# Patient Record
Sex: Male | Born: 1950 | Race: Black or African American | Hispanic: No | Marital: Married | Smoking: Former smoker
Health system: Southern US, Community
[De-identification: ages and names within clinical notes are randomized; demographics above are authoritative.]

## PROBLEM LIST (undated history)

## (undated) DIAGNOSIS — E119 Type 2 diabetes mellitus without complications: Secondary | ICD-10-CM

## (undated) DIAGNOSIS — K859 Acute pancreatitis without necrosis or infection, unspecified: Secondary | ICD-10-CM

## (undated) DIAGNOSIS — I10 Essential (primary) hypertension: Secondary | ICD-10-CM

## (undated) DIAGNOSIS — Z8619 Personal history of other infectious and parasitic diseases: Secondary | ICD-10-CM

## (undated) DIAGNOSIS — T8859XA Other complications of anesthesia, initial encounter: Secondary | ICD-10-CM

## (undated) DIAGNOSIS — F4310 Post-traumatic stress disorder, unspecified: Secondary | ICD-10-CM

## (undated) DIAGNOSIS — G629 Polyneuropathy, unspecified: Secondary | ICD-10-CM

## (undated) DIAGNOSIS — R42 Dizziness and giddiness: Secondary | ICD-10-CM

## (undated) DIAGNOSIS — Z87442 Personal history of urinary calculi: Secondary | ICD-10-CM

## (undated) DIAGNOSIS — E7800 Pure hypercholesterolemia, unspecified: Secondary | ICD-10-CM

## (undated) DIAGNOSIS — M1990 Unspecified osteoarthritis, unspecified site: Secondary | ICD-10-CM

## (undated) DIAGNOSIS — G4730 Sleep apnea, unspecified: Secondary | ICD-10-CM

## (undated) HISTORY — PX: BACK SURGERY: SHX140

## (undated) HISTORY — PX: HERNIA REPAIR: SHX51

## (undated) HISTORY — PX: COLONOSCOPY: SHX174

## (undated) HISTORY — PX: CIRCUMCISION: SUR203

## (undated) HISTORY — PX: HYDROCELE EXCISION / REPAIR: SUR1145

---

## 2004-07-17 ENCOUNTER — Emergency Department: Payer: Self-pay | Admitting: General Practice

## 2004-09-21 ENCOUNTER — Ambulatory Visit: Payer: Self-pay | Admitting: Unknown Physician Specialty

## 2004-11-09 ENCOUNTER — Ambulatory Visit: Payer: Self-pay | Admitting: Unknown Physician Specialty

## 2005-01-16 ENCOUNTER — Ambulatory Visit: Payer: Self-pay | Admitting: Pain Medicine

## 2005-01-19 ENCOUNTER — Ambulatory Visit: Payer: Self-pay | Admitting: Pain Medicine

## 2005-01-23 ENCOUNTER — Ambulatory Visit: Payer: Self-pay | Admitting: Pain Medicine

## 2005-02-02 ENCOUNTER — Ambulatory Visit: Payer: Self-pay | Admitting: Physician Assistant

## 2005-02-09 ENCOUNTER — Ambulatory Visit: Payer: Self-pay | Admitting: Pain Medicine

## 2005-02-23 ENCOUNTER — Ambulatory Visit: Payer: Self-pay | Admitting: Pain Medicine

## 2005-03-13 ENCOUNTER — Ambulatory Visit: Payer: Self-pay | Admitting: Physician Assistant

## 2005-03-17 ENCOUNTER — Ambulatory Visit: Payer: Self-pay | Admitting: Neurosurgery

## 2005-03-21 ENCOUNTER — Ambulatory Visit: Payer: Self-pay | Admitting: Neurosurgery

## 2005-05-17 ENCOUNTER — Ambulatory Visit (HOSPITAL_COMMUNITY): Admission: RE | Admit: 2005-05-17 | Discharge: 2005-05-18 | Payer: Self-pay | Admitting: Neurosurgery

## 2005-06-06 ENCOUNTER — Ambulatory Visit: Payer: Self-pay | Admitting: Neurosurgery

## 2005-06-29 ENCOUNTER — Ambulatory Visit: Payer: Self-pay | Admitting: Neurosurgery

## 2005-07-03 ENCOUNTER — Ambulatory Visit (HOSPITAL_COMMUNITY): Admission: RE | Admit: 2005-07-03 | Discharge: 2005-07-04 | Payer: Self-pay | Admitting: Neurosurgery

## 2005-07-28 ENCOUNTER — Ambulatory Visit: Payer: Self-pay | Admitting: Gastroenterology

## 2005-07-28 ENCOUNTER — Emergency Department: Payer: Self-pay | Admitting: Emergency Medicine

## 2005-08-14 ENCOUNTER — Ambulatory Visit: Payer: Self-pay | Admitting: Unknown Physician Specialty

## 2005-08-16 ENCOUNTER — Ambulatory Visit: Payer: Self-pay | Admitting: Internal Medicine

## 2005-08-17 ENCOUNTER — Ambulatory Visit: Payer: Self-pay | Admitting: Gastroenterology

## 2005-10-10 ENCOUNTER — Encounter: Admission: RE | Admit: 2005-10-10 | Discharge: 2005-10-10 | Payer: Self-pay | Admitting: Neurosurgery

## 2005-10-25 ENCOUNTER — Encounter: Admission: RE | Admit: 2005-10-25 | Discharge: 2005-10-25 | Payer: Self-pay | Admitting: Neurosurgery

## 2005-12-28 ENCOUNTER — Encounter: Admission: RE | Admit: 2005-12-28 | Discharge: 2005-12-28 | Payer: Self-pay | Admitting: Neurosurgery

## 2006-01-24 ENCOUNTER — Ambulatory Visit: Payer: Self-pay | Admitting: Pain Medicine

## 2006-02-05 ENCOUNTER — Ambulatory Visit: Payer: Self-pay | Admitting: Pain Medicine

## 2006-02-16 ENCOUNTER — Ambulatory Visit: Payer: Self-pay | Admitting: Physician Assistant

## 2006-03-20 ENCOUNTER — Ambulatory Visit: Payer: Self-pay | Admitting: Physician Assistant

## 2006-04-19 ENCOUNTER — Ambulatory Visit: Payer: Self-pay | Admitting: Physician Assistant

## 2006-05-16 ENCOUNTER — Ambulatory Visit: Payer: Self-pay | Admitting: Pain Medicine

## 2006-06-18 ENCOUNTER — Ambulatory Visit: Payer: Self-pay | Admitting: Physician Assistant

## 2006-07-17 ENCOUNTER — Ambulatory Visit: Payer: Self-pay | Admitting: Physician Assistant

## 2006-08-15 ENCOUNTER — Ambulatory Visit: Payer: Self-pay | Admitting: Physician Assistant

## 2006-09-17 ENCOUNTER — Ambulatory Visit: Payer: Self-pay | Admitting: Physician Assistant

## 2006-10-16 ENCOUNTER — Ambulatory Visit: Payer: Self-pay | Admitting: Physician Assistant

## 2006-11-20 ENCOUNTER — Ambulatory Visit: Payer: Self-pay | Admitting: Physician Assistant

## 2006-11-27 ENCOUNTER — Ambulatory Visit: Payer: Self-pay | Admitting: Pain Medicine

## 2006-12-19 ENCOUNTER — Ambulatory Visit: Payer: Self-pay | Admitting: Physician Assistant

## 2007-01-15 ENCOUNTER — Ambulatory Visit: Payer: Self-pay | Admitting: Physician Assistant

## 2007-02-19 ENCOUNTER — Ambulatory Visit: Payer: Self-pay | Admitting: Physician Assistant

## 2007-03-19 ENCOUNTER — Ambulatory Visit: Payer: Self-pay | Admitting: Physician Assistant

## 2007-04-25 ENCOUNTER — Ambulatory Visit: Payer: Self-pay | Admitting: Pain Medicine

## 2007-05-20 ENCOUNTER — Ambulatory Visit: Payer: Self-pay | Admitting: Pain Medicine

## 2007-06-04 ENCOUNTER — Ambulatory Visit: Payer: Self-pay | Admitting: Pain Medicine

## 2007-07-16 ENCOUNTER — Ambulatory Visit: Payer: Self-pay | Admitting: Physician Assistant

## 2007-08-13 ENCOUNTER — Ambulatory Visit: Payer: Self-pay | Admitting: Physician Assistant

## 2007-09-10 ENCOUNTER — Ambulatory Visit: Payer: Self-pay | Admitting: Physician Assistant

## 2007-09-17 ENCOUNTER — Ambulatory Visit: Payer: Self-pay | Admitting: Pain Medicine

## 2007-10-02 ENCOUNTER — Ambulatory Visit: Payer: Self-pay | Admitting: Pain Medicine

## 2007-10-07 ENCOUNTER — Ambulatory Visit: Payer: Self-pay | Admitting: Pain Medicine

## 2007-10-14 ENCOUNTER — Ambulatory Visit: Payer: Self-pay | Admitting: Pain Medicine

## 2007-11-04 ENCOUNTER — Ambulatory Visit: Payer: Self-pay | Admitting: Urology

## 2007-12-10 ENCOUNTER — Ambulatory Visit: Payer: Self-pay | Admitting: Physician Assistant

## 2008-02-17 ENCOUNTER — Ambulatory Visit: Payer: Self-pay | Admitting: Physician Assistant

## 2008-04-14 ENCOUNTER — Ambulatory Visit: Payer: Self-pay | Admitting: Physician Assistant

## 2009-03-31 ENCOUNTER — Ambulatory Visit: Payer: Self-pay | Admitting: Family Medicine

## 2009-12-20 ENCOUNTER — Ambulatory Visit: Payer: Self-pay | Admitting: Gastroenterology

## 2010-06-17 ENCOUNTER — Ambulatory Visit: Payer: Self-pay | Admitting: Otolaryngology

## 2012-06-19 DIAGNOSIS — N138 Other obstructive and reflux uropathy: Secondary | ICD-10-CM | POA: Insufficient documentation

## 2012-06-19 DIAGNOSIS — N529 Male erectile dysfunction, unspecified: Secondary | ICD-10-CM | POA: Insufficient documentation

## 2012-06-19 DIAGNOSIS — E291 Testicular hypofunction: Secondary | ICD-10-CM | POA: Insufficient documentation

## 2012-06-19 DIAGNOSIS — N411 Chronic prostatitis: Secondary | ICD-10-CM | POA: Insufficient documentation

## 2012-10-30 DIAGNOSIS — M1710 Unilateral primary osteoarthritis, unspecified knee: Secondary | ICD-10-CM | POA: Insufficient documentation

## 2012-10-30 DIAGNOSIS — M48061 Spinal stenosis, lumbar region without neurogenic claudication: Secondary | ICD-10-CM | POA: Insufficient documentation

## 2013-04-01 ENCOUNTER — Emergency Department: Payer: Self-pay | Admitting: Emergency Medicine

## 2013-04-01 LAB — CBC WITH DIFFERENTIAL/PLATELET
Basophil #: 0 10*3/uL (ref 0.0–0.1)
Basophil %: 0.4 %
Eosinophil #: 0.1 10*3/uL (ref 0.0–0.7)
HCT: 46.5 % (ref 40.0–52.0)
HGB: 15.4 g/dL (ref 13.0–18.0)
Lymphocyte #: 1.5 10*3/uL (ref 1.0–3.6)
MCH: 30.5 pg (ref 26.0–34.0)
MCV: 92 fL (ref 80–100)
Monocyte %: 5.9 %
Neutrophil %: 69.9 %
Platelet: 125 10*3/uL — ABNORMAL LOW (ref 150–440)
RDW: 14.1 % (ref 11.5–14.5)
WBC: 6.6 10*3/uL (ref 3.8–10.6)

## 2013-04-01 LAB — COMPREHENSIVE METABOLIC PANEL
Albumin: 3.9 g/dL (ref 3.4–5.0)
Alkaline Phosphatase: 66 U/L
Anion Gap: 3 — ABNORMAL LOW (ref 7–16)
Bilirubin,Total: 0.7 mg/dL (ref 0.2–1.0)
Calcium, Total: 9.3 mg/dL (ref 8.5–10.1)
Co2: 31 mmol/L (ref 21–32)
EGFR (Non-African Amer.): >60
Glucose: 246 mg/dL — ABNORMAL HIGH (ref 65–99)
SGOT(AST): 23 U/L (ref 15–37)

## 2013-04-01 LAB — URINALYSIS, COMPLETE
Bilirubin,UR: NEGATIVE
Blood: NEGATIVE
Glucose,UR: >=500 mg/dL (ref 0–75)
Leukocyte Esterase: NEGATIVE
Protein: NEGATIVE
RBC,UR: <1 /HPF (ref 0–5)
Squamous Epithelial: <1

## 2013-04-01 LAB — TROPONIN I: Troponin-I: <0.02 ng/mL

## 2013-04-01 LAB — LIPASE, BLOOD: Lipase: 477 U/L — ABNORMAL HIGH (ref 73–393)

## 2013-05-19 DIAGNOSIS — K4090 Unilateral inguinal hernia, without obstruction or gangrene, not specified as recurrent: Secondary | ICD-10-CM | POA: Insufficient documentation

## 2013-05-19 DIAGNOSIS — N433 Hydrocele, unspecified: Secondary | ICD-10-CM | POA: Insufficient documentation

## 2013-06-17 ENCOUNTER — Ambulatory Visit: Payer: Self-pay | Admitting: Urology

## 2013-06-17 LAB — BASIC METABOLIC PANEL
ANION GAP: 4 — AB (ref 7–16)
BUN: 14 mg/dL (ref 7–18)
CALCIUM: 9.6 mg/dL (ref 8.5–10.1)
CREATININE: 1.08 mg/dL (ref 0.60–1.30)
Chloride: 100 mmol/L (ref 98–107)
Co2: 32 mmol/L (ref 21–32)
EGFR (African American): >60
Glucose: 269 mg/dL — ABNORMAL HIGH (ref 65–99)
Osmolality: 282 (ref 275–301)
POTASSIUM: 3.9 mmol/L (ref 3.5–5.1)
SODIUM: 136 mmol/L (ref 136–145)

## 2013-07-01 ENCOUNTER — Ambulatory Visit: Payer: Self-pay | Admitting: Urology

## 2013-07-14 LAB — PATHOLOGY REPORT

## 2013-08-15 ENCOUNTER — Inpatient Hospital Stay: Payer: Self-pay | Admitting: Internal Medicine

## 2013-08-15 DIAGNOSIS — Z8719 Personal history of other diseases of the digestive system: Secondary | ICD-10-CM | POA: Insufficient documentation

## 2013-08-15 LAB — CBC WITH DIFFERENTIAL/PLATELET
BASOS ABS: 0.1 10*3/uL (ref 0.0–0.1)
Basophil %: 0.6 %
Eosinophil #: 0.1 10*3/uL (ref 0.0–0.7)
Eosinophil %: 0.9 %
HCT: 41.1 % (ref 40.0–52.0)
HGB: 12.8 g/dL — AB (ref 13.0–18.0)
LYMPHS PCT: 20.3 %
Lymphocyte #: 1.8 10*3/uL (ref 1.0–3.6)
MCH: 28.9 pg (ref 26.0–34.0)
MCHC: 31.1 g/dL — ABNORMAL LOW (ref 32.0–36.0)
MCV: 93 fL (ref 80–100)
Monocyte #: 0.8 x10 3/mm (ref 0.2–1.0)
Monocyte %: 8.8 %
NEUTROS PCT: 69.4 %
Neutrophil #: 6.2 10*3/uL (ref 1.4–6.5)
PLATELETS: 139 10*3/uL — AB (ref 150–440)
RBC: 4.43 10*6/uL (ref 4.40–5.90)
RDW: 15.2 % — AB (ref 11.5–14.5)
WBC: 8.9 10*3/uL (ref 3.8–10.6)

## 2013-08-15 LAB — COMPREHENSIVE METABOLIC PANEL
ALBUMIN: 3.6 g/dL (ref 3.4–5.0)
ALT: 29 U/L (ref 12–78)
ANION GAP: 2 — AB (ref 7–16)
Alkaline Phosphatase: 62 U/L
BILIRUBIN TOTAL: 0.4 mg/dL (ref 0.2–1.0)
BUN: 18 mg/dL (ref 7–18)
CHLORIDE: 104 mmol/L (ref 98–107)
CO2: 28 mmol/L (ref 21–32)
CREATININE: 1.5 mg/dL — AB (ref 0.60–1.30)
Calcium, Total: 9 mg/dL (ref 8.5–10.1)
EGFR (African American): 56 — ABNORMAL LOW
EGFR (Non-African Amer.): 49 — ABNORMAL LOW
GLUCOSE: 170 mg/dL — AB (ref 65–99)
Osmolality: 274 (ref 275–301)
POTASSIUM: 4.1 mmol/L (ref 3.5–5.1)
SGOT(AST): 23 U/L (ref 15–37)
Sodium: 134 mmol/L — ABNORMAL LOW (ref 136–145)
Total Protein: 8.1 g/dL (ref 6.4–8.2)

## 2013-08-15 LAB — URINALYSIS, COMPLETE
BACTERIA: NONE SEEN
BLOOD: NEGATIVE
Bilirubin,UR: NEGATIVE
GLUCOSE, UR: NEGATIVE mg/dL (ref 0–75)
Ketone: NEGATIVE
Leukocyte Esterase: NEGATIVE
Nitrite: NEGATIVE
Ph: 5 (ref 4.5–8.0)
Protein: NEGATIVE
Specific Gravity: 1.019 (ref 1.003–1.030)

## 2013-08-15 LAB — LIPASE, BLOOD: LIPASE: 3779 U/L — AB (ref 73–393)

## 2013-08-16 ENCOUNTER — Ambulatory Visit: Payer: Self-pay | Admitting: Urology

## 2013-08-16 LAB — CBC WITH DIFFERENTIAL/PLATELET
BASOS PCT: 0.1 %
Basophil #: 0 10*3/uL (ref 0.0–0.1)
EOS ABS: 0.1 10*3/uL (ref 0.0–0.7)
Eosinophil %: 0.7 %
HCT: 36.2 % — ABNORMAL LOW (ref 40.0–52.0)
HGB: 11.5 g/dL — ABNORMAL LOW (ref 13.0–18.0)
Lymphocyte #: 1.8 10*3/uL (ref 1.0–3.6)
Lymphocyte %: 21.9 %
MCH: 29.5 pg (ref 26.0–34.0)
MCHC: 31.7 g/dL — ABNORMAL LOW (ref 32.0–36.0)
MCV: 93 fL (ref 80–100)
MONOS PCT: 10.7 %
Monocyte #: 0.9 x10 3/mm (ref 0.2–1.0)
NEUTROS ABS: 5.6 10*3/uL (ref 1.4–6.5)
Neutrophil %: 66.6 %
Platelet: 122 10*3/uL — ABNORMAL LOW (ref 150–440)
RBC: 3.9 10*6/uL — AB (ref 4.40–5.90)
RDW: 15.1 % — ABNORMAL HIGH (ref 11.5–14.5)
WBC: 8.4 10*3/uL (ref 3.8–10.6)

## 2013-08-16 LAB — BASIC METABOLIC PANEL
ANION GAP: 4 — AB (ref 7–16)
BUN: 19 mg/dL — ABNORMAL HIGH (ref 7–18)
CREATININE: 1.5 mg/dL — AB (ref 0.60–1.30)
Calcium, Total: 8.2 mg/dL — ABNORMAL LOW (ref 8.5–10.1)
Chloride: 105 mmol/L (ref 98–107)
Co2: 27 mmol/L (ref 21–32)
EGFR (African American): 56 — ABNORMAL LOW
EGFR (Non-African Amer.): 49 — ABNORMAL LOW
Glucose: 172 mg/dL — ABNORMAL HIGH (ref 65–99)
OSMOLALITY: 278 (ref 275–301)
Potassium: 3.8 mmol/L (ref 3.5–5.1)
SODIUM: 136 mmol/L (ref 136–145)

## 2013-08-16 LAB — LIPID PANEL
CHOLESTEROL: 122 mg/dL (ref 0–200)
HDL Cholesterol: 47 mg/dL (ref 40–60)
LDL CHOLESTEROL, CALC: 53 mg/dL (ref 0–100)
Triglycerides: 112 mg/dL (ref 0–200)
VLDL Cholesterol, Calc: 22 mg/dL (ref 5–40)

## 2013-08-16 LAB — LIPASE, BLOOD: Lipase: 604 U/L — ABNORMAL HIGH (ref 73–393)

## 2013-08-17 LAB — CBC WITH DIFFERENTIAL/PLATELET
BASOS ABS: 0 10*3/uL (ref 0.0–0.1)
BASOS PCT: 0.1 %
EOS ABS: 0.1 10*3/uL (ref 0.0–0.7)
Eosinophil %: 1 %
HCT: 37.1 % — ABNORMAL LOW (ref 40.0–52.0)
HGB: 11.7 g/dL — AB (ref 13.0–18.0)
LYMPHS ABS: 1.8 10*3/uL (ref 1.0–3.6)
LYMPHS PCT: 25.1 %
MCH: 29.7 pg (ref 26.0–34.0)
MCHC: 31.6 g/dL — ABNORMAL LOW (ref 32.0–36.0)
MCV: 94 fL (ref 80–100)
MONO ABS: 0.7 x10 3/mm (ref 0.2–1.0)
MONOS PCT: 9.9 %
NEUTROS ABS: 4.7 10*3/uL (ref 1.4–6.5)
NEUTROS PCT: 63.9 %
Platelet: 124 10*3/uL — ABNORMAL LOW (ref 150–440)
RBC: 3.95 10*6/uL — ABNORMAL LOW (ref 4.40–5.90)
RDW: 15 % — AB (ref 11.5–14.5)
WBC: 7.3 10*3/uL (ref 3.8–10.6)

## 2013-08-17 LAB — BASIC METABOLIC PANEL
Anion Gap: 2 — ABNORMAL LOW (ref 7–16)
BUN: 17 mg/dL (ref 7–18)
CHLORIDE: 106 mmol/L (ref 98–107)
CO2: 31 mmol/L (ref 21–32)
Calcium, Total: 8.2 mg/dL — ABNORMAL LOW (ref 8.5–10.1)
Creatinine: 1.37 mg/dL — ABNORMAL HIGH (ref 0.60–1.30)
GFR CALC NON AF AMER: 54 — AB
GLUCOSE: 138 mg/dL — AB (ref 65–99)
OSMOLALITY: 281 (ref 275–301)
Potassium: 4.5 mmol/L (ref 3.5–5.1)
SODIUM: 139 mmol/L (ref 136–145)

## 2013-08-17 LAB — LIPASE, BLOOD: LIPASE: 147 U/L (ref 73–393)

## 2013-08-20 ENCOUNTER — Ambulatory Visit: Payer: Self-pay | Admitting: Urology

## 2013-08-21 DIAGNOSIS — N200 Calculus of kidney: Secondary | ICD-10-CM | POA: Insufficient documentation

## 2013-08-28 ENCOUNTER — Ambulatory Visit: Payer: Self-pay | Admitting: Urology

## 2013-09-05 ENCOUNTER — Ambulatory Visit: Payer: Self-pay | Admitting: Urology

## 2013-12-10 DIAGNOSIS — M4120 Other idiopathic scoliosis, site unspecified: Secondary | ICD-10-CM | POA: Insufficient documentation

## 2014-08-29 NOTE — Op Note (Signed)
PATIENT NAME:  Ian Lucas, Ian Lucas MR#:  [id number] DATE OF BIRTH:  [birth year]  DATE OF PROCEDURE:  08/16/2013  PREOPERATIVE DIAGNOSIS:  Obstructing left ureteral calculus.  POSTOPERATIVE DIAGNOSIS:  Obstructing left ureteral calculus.  PROCEDURE PERFORMED:   1.  Cystoscopy. 2.  Left retrograde pyelogram. 3.  Placement of left ureteral stent 26 cm x 6 [place].  SURGEON:  Graceann Congress, M.D.  ANESTHESIA:  General.  ESTIMATED BLOOD LOSS:  Minimal.  DRAINS:   1.  Left ureteral stent, 26 cm x 6 [place]. 2.  A 16 French Foley catheter.  SPECIMENS:  None.  COMPLICATIONS:  None.  INDICATIONS FOR PROCEDURE:  Ian Lucas is a 64 year old man with a history of recurrent nephrolithiasis, presented to the Emergency Room on 08/15/2013 with unrelenting left flank pain since that morning.  He was admitted to the hospitalist service as he also had a significant elevation of his lipase and concern for pancreatitis, although the workup for that has been entirely negative.  CT scan did demonstrate a left proximal ureteral stone approximately 7 mm in size with mild proximal hydronephrosis and perinephric stranding.  His white count was within normal limits, though his creatinine was slightly elevated at 1.5.  His urinalysis was negative for infection.  This morning he continues to have severe persistent pain and considering the perinephric stranding in the setting of diabetes, this was a clear indication to proceed with placement of a left ureteral stent to drain any possible infection.  I discussed the risks and benefits of the procedure with him including risk of damage to the ureter, kidney or bladder and urethra, worsening infection, bleeding, ureteral stricture or loss of kidney.  He elected to proceed with left ureteral stent placement to drain his potential infection and hopefully relieve his pain.    DESCRIPTION OF PROCEDURE IN DETAIL:  After informed consent, Ian Lucas was brought to the operating  suite and placed in a supine position for administration of LMA anesthesia.  He was then repositioned in a low dorsal lithotomy and his lower abdomen, perineum and genitalia were then prepped and draped in the usual sterile fashion.  Preoperative IV antibiotics were given.    A 21 French rigid cystoscope was inserted per urethra.  The urethra, prostate and bladder all appeared normal, though he did have some prostatic enlargement.  The left ureteral orifice was identified and intubated with a 6 [place] open-ended catheter.  An angled sensor wire was passed through the 6 French catheter into the distal ureter and the 6 French catheter was advanced.  Gentle retrograde pyelogram was performed showing a normal caliber ureter without any severe hydronephrosis.  The sensor wire was then advanced into the left renal pelvis beyond the stone without difficulty.  The open-ended catheter was removed and over the sensor wire a 26 cm x 6 [employer] Percuflex ureteral stent was passed under direct cystoscopic and fluoroscopic guidance.  It was deployed with a 360 degree coil seen in both the renal pelvis and the bladder.  Upon deploying the stent, a significant amount of debris, mostly brown, but somewhat purulent was seen extruding from the stent and the ureteral orifice.  The cystoscope was removed and a new 16 French Foley catheter was inserted per urethra and the balloon was inflated and the catheter was allowed to gravity drainage.  This concluded the case.  The patient was awoken from anesthesia, extubated and brought to the PACU in stable condition.   POSTOPERATIVE PLAN:   1.  The patient will be admitted to the surgical ward and monitored overnight. 2.  He will require definitive management of his left ureteral stone within 1 to 2 weeks and this will be discussed with Dr. Achilles Dunk, his usual urologist.      ____________________________ Graceann Congress, MD agk:ea D: 08/16/2013 11:58:58  ET T: 08/16/2013 15:48:20 ET JOB#: [id number]  cc: Graceann Congress, MD, <Dictator> Beckem Tomberlin Jerelyn Scott MD ELECTRONICALLY SIGNED 10/07/2013 16:49

## 2014-08-29 NOTE — Consult Note (Signed)
PATIENT NAME:  Ian Lucas, Ian Lucas [id number] OF BIRTH:  Jun 14, 1950 OF CONSULTATION:  08/16/2013 CONSULT NOTE COMPLAINT: Left flank pain, obstructing left ureteral stone OF PRESENT ILLNESS: Ian Lucas is a 64 year old man with a history of nephrolithiasis, who developed severe left flank pain on 08/15/13 in the AM that radiated to the lower abdomen, pain 9/10 on a 10 scale, sharp and stabbing.  He denied fevers/chills, no nausea/vomiting.  No difficulty urinating or lower urinary tract symptoms, no gross hematuria.  He was recently treated by Urologist Dr. Achilles Lucas for hernia, hydrocele and circumcision.  On admission, Lipase was significantly elevated and he was admitted to the Internal Medicine Hospitalist service for workup. MEDICAL HISTORY:HypertensiondiabetesBPHhyperlipidemiashingles.  SURGICAL HISTORY: Hernia repair, hydrocele, and circumcision in 06/2013. Kidney stone surgery in the past, 3 back surgeries in the past.   ALLERGIES: CODEINE, IODINE, IVP DYE, BANANAS, AND PEANUTS.  HISTORY: No smoking, alcohol or drug use. Retired Child psychotherapist.  HISTORY: Father died at 43 of prostate cancer. Mother died at 79 of natural causes.   amlodipine 10 mg daily, aspirin 81 mg daily, Coreg 25 mg twice a day, gabapentin 300 mg 3 times a day, glipizide 5 mg twice a day, metformin 1000 mg twice a day, oxycodone 5 mg every 6 hours, Crestor 40 mg q.h.s., sildenafil 20 mg 3 to 5 tablets daily as needed, Flomax 0.4 mg 1 capsule twice a day.  OF SYSTEMS: Positive for sweats. No fever. No chills. No weight loss. Positive for weight gain. Positive for fatigue. Eyes: He does wear glasses. Ears, nose, mouth,  and throat: No hearing loss. No sore throat. No difficulty swallowing. No chest pain. No palpitations. No shortness of breath. No cough. No sputum. No hemoptysis. Positive for abdominal pain in the left lower quadrant, flank pain left flank. Positive for constipation. No bright red blood per rectum. No melena, no nausea, no  vomiting. No burning on urination. No hematuria. Pain in the fingers during the summer when he is working. No fainting or blackouts. No anxiety or depression. No thyroid problems. AND LYMPHATIC: No anemia. No easy bruising or bleeding.  EXAMINATION:  SIGNS: Temperature 97.8, HR 83, respirations 18, blood pressure 154/77, pulse oximetry 95% on room air. No acute distress. [place]/at, pupils equal and reactive, normal oropharynx and nasal cavity no LADClear to auscultation, no w/r/rno m/r/g, normal S1, S2Soft, mild LLQ tenderness, mild L CVA tendernessLeft hemiscrotum and penis still swollen, no erythema or evidence of infection. Penis circ incision healing well.Cranial nerves II through XII grossly intact. Deep tendon reflexes 2+ bilateral lower extremities. The patient is oriented to person, place, and time.  AND RADIOLOGICAL DATA: CT scan of the abdomen and pelvis showed a left 58mm proximal ureter stone with mild hydronephrosis and perinephric stranding on the left. No other renal stones. No pancreatic inflammation 4/10/15blood cell count 8.9, H and H 12.8 and 41.1, platelet count of 139,000. Glucose 170, BUN 18, creatinine 1.5, sodium 134, potassium 4.1, chloride 104, CO2 of 28, calcium 9.0. Liver function tests normal range. Lipase 3779. EKG normal sinus rhythm, 84 beats per minute. Urinalysis shows ph 5.0, SG 1.019, 3R, 1W, no bacteria AND PLAN:   64 year old man with recurrent nephrolithiasis and an obstructing left ureteral stone, 17mm.  His pain is unrelenting this morning. Despite no outward signs of infection (fever, leukocytosis), he has some mild perinephric stranding in the setting of diabetes and cannot tolerate the pain, which are clear indications for placement of a left ureteral stent. Will arrange for cystoscopy,  left retrograde pyelogram and left ureteral stent placement in the operating roomConsent obtained for surgeryPost-operative Foley catheter until 4/12/15Will require follow-up with Urology  for definitive kidney stone management.  Electronic Signatures: Graceann Congress (MD)  (Signed on 11-Apr-15 11:50)  Authored  Last Updated: 11-Apr-15 11:50 by Graceann Congress (MD)

## 2014-08-29 NOTE — Op Note (Signed)
PATIENT NAME:  Ian Lucas, Ian Lucas MR#:  [id number] DATE OF BIRTH:  Dec 18, 1950  DATE OF PROCEDURE:  07/01/2013  PRINCIPAL DIAGNOSES: Direct left inguinal hernia, left hydrocele, balanitis.   POSTOPERATIVE DIAGNOSES: Direct left inguinal hernia, left hydrocele, balanitis.   PROCEDURE: Left inguinal hernia repair, left hydrocelectomy, circumcision.   SURGEON: Madolyn Frieze. Achilles Dunk, MD   ANESTHESIA: General endotracheal anesthesia.   INDICATIONS: The patient is a 64 year old African American gentleman with a history of recurring epididymal orchitis. He has had significant pain and discomfort in the left groin and hemiscrotum. He has been noted to have a progressively enlarging left inguinal hernia as well as persistent progressive hydrocele. He was recently treated with a course of antibiotics for recurrent epididymo-orchitis. He was noted to have some significant abnormalities within the left epididymis worrisome for possible tumor. Subsequent followup ultrasound demonstrated subsequent improvement. There do appear to be several spermatoceles present on physical examination. He has also had multiple recurrent bouts of balanitis related to diabetes mellitus. He has had significant recurrent issues with his foreskin. He has also elected to proceed with circumcision. He presents today for these procedures.   DESCRIPTION OF PROCEDURE: After informed consent was obtained, the patient was taken to the operating room and placed in the supine position on the operating table under general endotracheal anesthesia. The patient was then prepped and draped in the usual standard fashion. An approximately 6 cm left inguinal crease incision was made. The incision was continued down through the external fat to the level of the fascia. The external ring could be easily identified. The tissue surrounding the ring was noted to be very thick and rubbery and very adherent to the underlying tissue. The external inguinal ring  demonstrated significant bulging consistent with hernia. A small incision was made over the cord contents. The canal was opened utilizing Metzenbaum scissors, with care taken to avoid injury to the ilioinguinal nerve. It was easily identified and mobilized laterally. The cord was dissected free from its surrounding structures. A large segment of fat was noted extending through the posterior wall alongside of the cord, but did not appear to come through the internal ring. Once the cord was enveloped, pressure was applied to the left hemiscrotum. The testicle was delivered into the incision site. Once again, extensive inflammatory changes were noted of the scrotal tissue, making separation of the hydrocele from the surrounding hemiscrotal tissue more difficult. Time was taken to ensure that the entire hydrocele sac was removed with the testicle removed from the hemiscrotum. The transverse fascia was identified. A relaxing incision was then made. The shelving edge was identified . Cooper's ligament was also cleaned of surrounding tissue. Attention was then turned to the large fat component alongside the medial aspect of the cord. A sizable defect was noted in the posterior floor of the inguinal canal. The fat was separated from the cord contents. Once again it was noted to be very thick and rubbery consistent with recent inflammation. Once the bulk of the tissue was removed, an attempt was made at reducing the segment back into the abdominal compartment. It was fairly fixed and was unable to be performed. A thin layer of what appeared to be sac was opened. The fat was noted to be very adherent to the sac. The sac was unable to be adequately stripped from multiple portions of the fat. The fat was subsequently freed, however, from the portions of the sac that could be identified. It was then placed back into the peritoneal cavity.  The edges of the sac were grasped utilizing an Allis clamp. It was twisted. It was suture  ligated with a 0 silk tie. A small portion of the excess was trimmed free. This was sent to pathology for further evaluation. Once the hernia sac was reduced and excised, Ethibond sutures were then placed from the shelving edge to the Cooper's ligament. Allis clamps were utilized to help identify the shelving edge throughout the reapproximation. Approximately 13 sutures were initially placed. They were then tied medial to lateral with good reapproximation and closure of the defect. A slightly larger than desired opening was still present at the level of the internal ring. Two additional Ethibond sutures were placed from the shelving edge to the ligament. As they were tied, this brought the size of the internal ring down to an adequate size, which barely introduced the index fingertip. No undue tension was placed on the cord contents.   The attention was then turned to the testicle. The hydrocele sac was opened on its anterior surface. A moderate amount of fluid was drained from the hydrocele sac. A portion of the hydrocele sac was then trimmed. It was evaginated around the posterior aspect of the testicle. The epididymis itself appeared to be relatively normal. It was very hypervascular consistent with recent inflammation. Areas of debris were noted within the hemiscrotal compartment as well as some cholesterol deposits. No septations, however, were appreciated. A very prominent appendix testis and epididymis were identified. These were excised utilizing electrocautery. Each were sent for pathology analysis. The edges of the hydrocele sac were then reapproximated posterior to the testicle utilizing a 3-0 chromic suture in a running fashion. The testicle was returned back to the left hemiscrotum in proper orientation. The external fascia was closed overlying the cord, re-creating the canal as best possible. The ilioinguinal nerve was placed on the dorsal aspect of the cord contents prior to closure of the canal.  The subcutaneous tissue was then closed utilizing a plain gut suture. The skin was closed utilizing a running 4-0 Vicryl subcuticular stitch. Steri-Strips, Telfa, and Tegaderm dressing were then applied.   Attention was then turned to the penis. A circumferential incision was made approximately 3 mm proximal to the coronal sulcus. The foreskin was then withdrawn over the glans penis. A hemostat was placed at the 6 and 12-o'clock positions at the approximate level of the coronal sulcus. A second circumferential incision was made at this level. A tunnel was then made under the dorsal foreskin cuff. The cuff was then removed circumferentially utilizing electrocautery. No significant bleeding was encountered. After adequate hemostasis was obtained, the skin edges were first reapproximated at the 6 and 12-o'clock positions utilizing interrupted 3-0 Monocryl suture. Additional interrupted 3-0 Monocryl sutures were then utilized to complete the reanastomosis circumferentially. A Vaseline gauze, Kling, and Coban dressing was then applied. A block was performed around the base of the penis utilizing 10 mL of 0.5% Marcaine. The inguinal incision site was also injected with 10 mL of 0.5% Marcaine prior to placing the Tegaderm dressing. Once the procedure was completed, the patient was awakened from general endotracheal anesthesia. He was taken to the recovery room in stable condition. There were no problems or complications. The patient tolerated the procedure well.   SPECIMENS: From the hydrocele sac, the appendix epididymis, the appendix testis, and the hernia sac.   ESTIMATED BLOOD LOSS: Less than 50 mL.   ____________________________ Madolyn Frieze. Achilles Dunk, MD bsc:jcm D: 07/01/2013 13:27:02 ET T: 07/01/2013 13:45:35 ET JOB#: [id number]  cc: Madolyn Frieze. Achilles Dunk, MD, <Dictator> Madolyn Frieze Kelisha Dall MD ELECTRONICALLY SIGNED 07/01/2013 22:55

## 2014-08-29 NOTE — Consult Note (Signed)
Chief Complaint:  Subjective/Chief Complaint obstructing left ureteral stone   VITAL SIGNS/ANCILLARY NOTES: **Vital Signs.:   12-Apr-15 10:04  Vital Signs Type Q 4hr  Temperature Temperature (F) 98.5  Celsius 36.9  Temperature Source oral  Pulse Pulse 88  Respirations Respirations 18  Systolic BP Systolic BP 778  Diastolic BP (mmHg) Diastolic BP (mmHg) 69  Mean BP 88  Pulse Ox % Pulse Ox % 88  Pulse Ox Activity Level  At rest  Oxygen Delivery Room Air/ 21 %  *Intake and Output.:   Daily 12-Apr-15 07:00  Grand Totals Intake:  2625 Output:  2423    Net:  -1225 24 Hr.:  -1225  Oral Intake      In:  1200  IV (Primary)      In:  1425  Urine ml     Out:  3850  Length of Stay Totals Intake:  3926 Output:  4150    Net:  -224   Brief Assessment:  GEN well developed, well nourished   Cardiac Regular   Respiratory normal resp effort  clear BS   Gastrointestinal Normal   Gastrointestinal details normal Soft  No CVAT   EXTR negative cyanosis/clubbing, negative edema   Additional Physical Exam Foley catheter draining yellow urine with small amount of brown sediment   Lab Results: Routine Chem:  12-Apr-15 04:15   Lipase 147 (Result(s) reported on 17 Aug 2013 at 08:51AM.)  Glucose, Serum  138  BUN 17  Creatinine (comp)  1.37  Sodium, Serum 139  Potassium, Serum 4.5  Chloride, Serum 106  CO2, Serum 31  Calcium (Total), Serum  8.2  Anion Gap  2  Osmolality (calc) 281  eGFR (African American) >60  eGFR (Non-African American)  54 (eGFR values <36m/min/1.73 m2 may be an indication of chronic kidney disease (CKD). Calculated eGFR is useful in patients with stable renal function. The eGFR calculation will not be reliable in acutely ill patients when serum creatinine is changing rapidly. It is not useful in  patients on dialysis. The eGFR calculation may not be applicable to patients at the low and high extremes of body sizes, pregnant women, and vegetarians.)   Routine Hem:  12-Apr-15 04:15   WBC (CBC) 7.3  RBC (CBC)  3.95  Hemoglobin (CBC)  11.7  Hematocrit (CBC)  37.1  Platelet Count (CBC)  124  MCV 94  MCH 29.7  MCHC  31.6  RDW  15.0  Neutrophil % 63.9  Lymphocyte % 25.1  Monocyte % 9.9  Eosinophil % 1.0  Basophil % 0.1  Neutrophil # 4.7  Lymphocyte # 1.8  Monocyte # 0.7  Eosinophil # 0.1  Basophil # 0.0 (Result(s) reported on 17 Aug 2013 at 05:21AM.)   Assessment/Plan:  Assessment/Plan:  Assessment 10M with obstructing left ureteral stone POD 1 s/p L ureteral stent placement   Plan 1) D/C Foley catheter today (order placed) 2) D/C home per medicine with oxybutynin 568mq8h prn bladder spasm, flomax 0.57m72maily, pyridium 100m52mD prn bladder pain and cipro 500mg55m to complete 5 day course 3) Follow-up with Dr. Cope Jacqlyn Larsendefinitive left ureteral stone treatment   Electronic Signatures: KaplaPrentiss Bells  (Signed 12-Apr-15 10:28)  Authored: Chief Complaint, VITAL SIGNS/ANCILLARY NOTES, Brief Assessment, Lab Results, Assessment/Plan   Last Updated: 12-Apr-15 10:28 by KaplaPrentiss Bells

## 2014-08-29 NOTE — Discharge Summary (Signed)
Dates of Admission and Diagnosis:  Date of Admission 15-Aug-2013   Date of Discharge 18-Aug-2013   Admitting Diagnosis Abd pain   Final Diagnosis 1. Left ureteral 70m stone s/p stent 2. Elevated lipase due to above 3. HTN 4. DM    Chief Complaint/History of Present Illness CHIEF COMPLAINT: Flank pain and abdominal pain.   HISTORY OF PRESENT ILLNESS: This is a 64year old man who woke up today with his back killing him, then it radiated into his lower abdominal area. Pain 9/10 in intensity. Then the went away and then he came back in this evening and pain came back 9/10 in intensity in the flank and left lower abdomen. He complains of no problem urinating. No blood in the urine. He recently had seen Dr. CJacqlyn Larsenin the office for a postoperative visit for hernia hydrocele and circumcision and he still had a lot of swelling in that area. In the ER, he was found to have an elevated lipase of 3779, a creatinine of 1.5, and a CT scan of the abdomen and pelvis that showed obstructive nephropathy on the left secondary to a 7-mm ureteral stone with mild proximal hydronephrosis. Of note, his pancreas did not show any acute findings. Hospitalist services were contacted for further evaluation. I did speak with Dr. KDeatra Ina urology covering for Dr. CJacqlyn Larsenand he will see the patient in the a.m.   Allergies:  banannas: Anaphylaxis  Codeine: Itching  Iodine: Swelling  IVP Dye: Swelling  Peanuts: Unknown  Apple: Resp. Distress  Routine Chem:  11-Apr-15 04:14   Lipase  604 (Result(s) reported on 16 Aug 2013 at 05:40AM.)  Glucose, Serum  172  BUN  19  Creatinine (comp)  1.50  Sodium, Serum 136  Potassium, Serum 3.8  Chloride, Serum 105  CO2, Serum 27  Calcium (Total), Serum  8.2  Anion Gap  4  Osmolality (calc) 278  eGFR (African American)  56  eGFR (Non-African American)  49 (eGFR values <650mmin/1.73 m2 may be an indication of chronic kidney disease (CKD). Calculated eGFR is useful in patients  with stable renal function. The eGFR calculation will not be reliable in acutely ill patients when serum creatinine is changing rapidly. It is not useful in  patients on dialysis. The eGFR calculation may not be applicable to patients at the low and high extremes of body sizes, pregnant women, and vegetarians.)  Cholesterol, Serum 122  Triglycerides, Serum 112  HDL (INHOUSE) 47  VLDL Cholesterol Calculated 22  LDL Cholesterol Calculated 53 (Result(s) reported on 16 Aug 2013 at 05:46AM.)  12-Apr-15 04:15   Lipase 147 (Result(s) reported on 17 Aug 2013 at 08:51AM.)  Glucose, Serum  138  BUN 17  Creatinine (comp)  1.37  Sodium, Serum 139  Potassium, Serum 4.5  Chloride, Serum 106  CO2, Serum 31  Calcium (Total), Serum  8.2  Anion Gap  2  Osmolality (calc) 281  eGFR (African American) >60  eGFR (Non-African American)  54 (eGFR values <6053min/1.73 m2 may be an indication of chronic kidney disease (CKD). Calculated eGFR is useful in patients with stable renal function. The eGFR calculation will not be reliable in acutely ill patients when serum creatinine is changing rapidly. It is not useful in  patients on dialysis. The eGFR calculation may not be applicable to patients at the low and high extremes of body sizes, pregnant women, and vegetarians.)  Routine Hem:  11-Apr-15 04:14   WBC (CBC) 8.4  RBC (CBC)  3.90  Hemoglobin (CBC)  11.5  Hematocrit (CBC)  36.2  Platelet Count (CBC)  122  MCV 93  MCH 29.5  MCHC  31.7  RDW  15.1  Neutrophil % 66.6  Lymphocyte % 21.9  Monocyte % 10.7  Eosinophil % 0.7  Basophil % 0.1  Neutrophil # 5.6  Lymphocyte # 1.8  Monocyte # 0.9  Eosinophil # 0.1  Basophil # 0.0 (Result(s) reported on 16 Aug 2013 at 05:51AM.)  12-Apr-15 04:15   WBC (CBC) 7.3  RBC (CBC)  3.95  Hemoglobin (CBC)  11.7  Hematocrit (CBC)  37.1  Platelet Count (CBC)  124  MCV 94  MCH 29.7  MCHC  31.6  RDW  15.0  Neutrophil % 63.9  Lymphocyte % 25.1  Monocyte %  9.9  Eosinophil % 1.0  Basophil % 0.1  Neutrophil # 4.7  Lymphocyte # 1.8  Monocyte # 0.7  Eosinophil # 0.1  Basophil # 0.0 (Result(s) reported on 17 Aug 2013 at 05:21AM.)   Pertinent Past History:  Pertinent Past History DM HTN   Hospital Course:  Hospital Course 1. Acute pancreatitis? - patient does not drink alcohol.  -ultrasound of abd neg for  gallstones - lipid profile negative -tolerating diet. lipase trending down. 3700->604-->147 The elevtaion in lipase is likely renal in origin  2. Obstructive nephropathy with a 7-mm stone with slight hydronephrosis. -dr Deatra Ina placed left uretral stent on 08/16/13 -prn morphine and po norco -ditropan XL for spasmodic pain -po cipro bid-empiric -f/u Dr. Jacqlyn Larsen as OP in a week  3. Hypertension. Continue current medications.   4. Diabetes. We will put on sliding scale and hold oral medications at this point.   5. Hyperlipidemia. Continue the Crestor.   On day of d/c  Abd- Soft, BS+, Non tender S1, S2 Lungs CTA  Time spent today on discharge - 40 min   Condition on Discharge Fair   Code Status:  Code Status Full Code   DISCHARGE INSTRUCTIONS HOME MEDS:  Medication Reconciliation: Patient's Home Medications at Discharge:     Medication Instructions  oxycodone 5 mg oral tablet  1 tab(s) orally every 6 hours, As Needed - for Pain   amlodipine 10 mg oral tablet  1 tab(s) orally once a day   aspirin 81 mg oral delayed release tablet  1 tab(s) orally once a day   carvedilol 25 mg oral tablet  1 tab(s) orally 2 times a day   glipizide 5 mg oral tablet, extended release  1 tab(s) orally 2 times a day   metformin 1000 mg oral tablet  1 tab(s) orally 2 times a day   rosuvastatin 40 mg oral tablet  1 tab(s) orally once a day (at bedtime)   sildenafil 20 mg oral tablet  3-5 tablets daily as needed   tamsulosin 0.4 mg oral capsule  1 cap(s) orally 2 times a day   gabapentin 300 mg oral capsule  3 cap(s) orally 3 times a day    oxybutynin 5 mg oral tablet  1 tab(s) orally every 8 hours, As Needed, bladder spasms , As needed, bladder spasms   ciprofloxacin 500 mg oral tablet  1 tab(s) orally every 12 hours   pyridium 100 mg oral tablet  2 tab(s) orally 2 times a day, As Needed - for Painful Urination     Physician's Instructions:  Diet Low Sodium  Low Fat, Low Cholesterol  Carbohydrate Controlled (ADA) Diet   Activity Limitations As tolerated   Return to Work Not Applicable   Time frame for Follow  Up Appointment 1-2 weeks  Dr. Jacqlyn Larsen re- Left ureteral stone with stent   Electronic Signatures: Delanda Bulluck, Lottie Dawson (MD)  (Signed 13-Apr-15 18:35)  Authored: ADMISSION DATE AND DIAGNOSIS, CHIEF COMPLAINT/HPI, Allergies, PERTINENT LABS, PERTINENT PAST [place] MEDS, PATIENT INSTRUCTIONS   Last Updated: 13-Apr-15 18:35 by Alba Destine (MD)

## 2014-08-29 NOTE — H&P (Signed)
PATIENT NAME:  Ian Lucas, Ian Lucas MR#:  [id number] DATE OF BIRTH:  [birth year]  DATE OF ADMISSION:  08/15/2013  PRIMARY CARE PHYSICIAN: Alba Cory, MD   UROLOGIST: Assunta Gambles, MD  CHIEF COMPLAINT: Flank pain and abdominal pain.   HISTORY OF PRESENT ILLNESS: This is a 64 year old man who woke up today with his back killing him, then it radiated into his lower abdominal area. Pain 9/10 in intensity. Then the went away and then he came back in this evening and pain came back 9/10 in intensity in the flank and left lower abdomen. He complains of no problem urinating. No blood in the urine. He recently had seen Dr. Achilles Dunk in the office for a postoperative visit for hernia hydrocele and circumcision and he still had a lot of swelling in that area. In the ER, he was found to have an elevated lipase of 3779, a creatinine of 1.5, and a CT scan of the abdomen and pelvis that showed obstructive nephropathy on the left secondary to a 7-mm ureteral stone with mild proximal hydronephrosis. Of note, his pancreas did not show any acute findings. Hospitalist services were contacted for further evaluation. I did speak with Dr. Arlyce Dice, urology covering for Dr. Achilles Dunk and he will see the patient in the a.m.   PAST MEDICAL HISTORY: Hypertension, diabetes, BPH, hyperlipidemia, shingles.   PAST SURGICAL HISTORY: Hernia repair, hydrocele, and circumcision all in 1 surgery. Kidney stone surgery in the past, 3 back surgeries in the past.   ALLERGIES: CODEINE, IODINE, IVP DYE, BANANAS, AND PEANUTS.   SOCIAL HISTORY: No smoking. No alcohol. No drug use. Retired Child psychotherapist.   FAMILY HISTORY: Father died at 77 of prostate cancer. Mother died at 70 of natural causes.   MEDICATIONS: Include amlodipine 10 mg daily, aspirin 81 mg daily, Coreg 25 mg twice a day, gabapentin 300 mg 3 times a day, glipizide 5 mg twice a day, metformin 1000 mg twice a day, oxycodone 5 mg every 6 hours, Crestor 40 mg q.h.s., sildenafil 20 mg 3 to 5  tablets daily as needed, Flomax 0.4 mg 1 capsule twice a day.   REVIEW OF SYSTEMS:  CONSTITUTIONAL: Positive for sweats. No fever. No chills. No weight loss. Positive for weight gain. Positive for fatigue.  HEENT: Eyes: He does wear glasses. Ears, nose, mouth,  and throat: No hearing loss. No sore throat. No difficulty swallowing.  CARDIOVASCULAR: No chest pain. No palpitations.  RESPIRATORY: No shortness of breath. No cough. No sputum. No hemoptysis.  GASTROINTESTINAL: Positive for abdominal pain in the left lower quadrant, flank pain left flank. Positive for constipation. No bright red blood per rectum. No melena, no nausea, no vomiting.  GENITOURINARY: No burning on urination. No hematuria.  MUSCULOSKELETAL: Pain in the fingers during the summer when he is working.  NEUROLOGIC: No fainting or blackouts.  PSYCHIATRIC: No anxiety or depression.  ENDOCRINE: No thyroid problems.  HEMATOLOGIC AND LYMPHATIC: No anemia. No easy bruising or bleeding.   PHYSICAL EXAMINATION:  VITALS SIGNS: Temperature 97.8, pulse 83, respirations 18, blood pressure 154/77, pulse oximetry 95% on room air.  GENERAL: No respiratory distress.  HEENT: Eyes: Conjunctivae and lids normal. Pupils equal, round, and reactive to light. Extraocular muscles intact. No nystagmus. Ears, nose, mouth, and throat: Tympanic membranes: No erythema. Nasal mucosa: No erythema. Throat: No erythema, no exudate seen. Lips and gums: No lesions.  NECK: No JVD. No bruits. No lymphadenopathy. No thyromegaly. No thyroid nodules palpated.  LUNGS: Clear to auscultation. No use of accessory muscles  to breathe. No rhonchi, rales, or wheeze heard.  CARDIOVASCULAR: S1, S2 soft. No gallops, rubs, or murmurs heard. Carotid upstroke 2+ bilaterally. No bruits. Dorsalis pedal pulses 1+ bilaterally, 2+ edema.  ABDOMEN: Soft. Positive tenderness in the left lower quadrant and left flank. No organomegaly/splenomegaly. Normoactive bowel sounds. No masses felt.   LYMPHATIC: No lymph nodes in the neck.  MUSCULOSKELETAL: 2+ edema. No clubbing. No cyanosis.  GENITOURINARY: Left scrotum swollen, penis swollen, left groin swollen.  NEUROLOGIC: Cranial nerves II through XII grossly intact. Deep tendon reflexes 2+ bilateral lower extremities.  PSYCHIATRIC: The patient is oriented to person, place, and time.   LABORATORY AND RADIOLOGICAL DATA: CT scan of the abdomen and pelvis showed no pancreatic inflammation, obstructive nephropathy on the left due to 7-mm uretal stone. Urinalysis negative.   White blood cell count 8.9, H and H 12.8 and 41.1, platelet count of 139,000. Glucose 170, BUN 18, creatinine 1.5, sodium 134, potassium 4.1, chloride 104, CO2 of 28, calcium 9.0. Liver function tests normal range. Lipase 3779. EKG normal sinus rhythm, 84 beats per minute.   ASSESSMENT AND PLAN:  1. Acute pancreatitis unclear etiology at this point. The patient does not drink alcohol. I will get a right upper quadrant ultrasound to rule out gallstones but CT scan was negative. I will check a lipid profile to make sure triglycerides is not a cause. Patient does take medications. Sometime cholesterol medication can do it. The patient may have had an antibiotic after the surgery. Unclear what that was. The patient did have recent shingles. I do not think the Valtrex can do it. We will check a lipase in the a.m. and see if that improves. Will keep n.p.o. at this point and give IV fluid hydration.  2. Obstructive nephropathy with a 7-mm stone with slight hydronephrosis. I spoke with urology, who will evaluate in the a.m. to see if a stent is needed. Likely will end up needing lithotripsy in the future.  3. Hypertension. Continue current medications.  4. Diabetes. We will put on sliding scale and hold oral medications at this point.  5. Hyperlipidemia. Continue the Crestor.  6. A history of shingles. Finished treatment. No signs.  7. Postoperative swelling of the groin, left  scrotum, and penis. We will have urology evaluate this in the a.m.   TIME SPENT ON ADMISSION: 55 minutes.    ____________________________ Herschell Dimes. Renae Gloss, MD rjw:lt D: 08/15/2013 22:02:21 ET T: 08/15/2013 22:41:50 ET JOB#: [id number]  cc: Herschell Dimes. Renae Gloss, MD, <Dictator> Onnie Boer. Carlynn Purl, MD Madolyn Frieze. Achilles Dunk, MD  Salley Scarlet MD ELECTRONICALLY SIGNED 08/17/2013 19:21

## 2015-01-21 ENCOUNTER — Ambulatory Visit
Admission: EM | Admit: 2015-01-21 | Discharge: 2015-01-21 | Disposition: A | Discharge status: Home / Self Care | Payer: Medicare Other | Attending: Family Medicine | Admitting: Family Medicine

## 2015-01-21 ENCOUNTER — Encounter: Payer: Self-pay | Admitting: Emergency Medicine

## 2015-01-21 DIAGNOSIS — N508 Other specified disorders of male genital organs: Secondary | ICD-10-CM

## 2015-01-21 DIAGNOSIS — R109 Unspecified abdominal pain: Secondary | ICD-10-CM | POA: Diagnosis not present

## 2015-01-21 DIAGNOSIS — G8929 Other chronic pain: Secondary | ICD-10-CM | POA: Diagnosis not present

## 2015-01-21 DIAGNOSIS — N50812 Left testicular pain: Secondary | ICD-10-CM

## 2015-01-21 HISTORY — DX: Type 2 diabetes mellitus without complications: E11.9

## 2015-01-21 HISTORY — DX: Pure hypercholesterolemia, unspecified: E78.00

## 2015-01-21 HISTORY — DX: Essential (primary) hypertension: I10

## 2015-01-21 LAB — LIPASE, BLOOD: Lipase: 44 U/L (ref 22–51)

## 2015-01-21 LAB — URINALYSIS COMPLETE WITH MICROSCOPIC (ARMC ONLY)
Bilirubin Urine: NEGATIVE
GLUCOSE, UA: NEGATIVE mg/dL
Hgb urine dipstick: NEGATIVE
KETONES UR: NEGATIVE mg/dL
Leukocytes, UA: NEGATIVE
NITRITE: NEGATIVE
pH: 6 (ref 5.0–8.0)

## 2015-01-21 LAB — CBC WITH DIFFERENTIAL/PLATELET
Basophils Absolute: 0 10*3/uL (ref 0–0.1)
Basophils Relative: 1 %
EOS PCT: 2 %
Eosinophils Absolute: 0.1 10*3/uL (ref 0–0.7)
HEMATOCRIT: 49.9 % (ref 40.0–52.0)
Hemoglobin: 16.6 g/dL (ref 13.0–18.0)
LYMPHS ABS: 1.9 10*3/uL (ref 1.0–3.6)
LYMPHS PCT: 33 %
MCH: 30.1 pg (ref 26.0–34.0)
MCHC: 33.2 g/dL (ref 32.0–36.0)
MCV: 90.5 fL (ref 80.0–100.0)
MONO ABS: 0.5 10*3/uL (ref 0.2–1.0)
Monocytes Relative: 9 %
NEUTROS ABS: 3.3 10*3/uL (ref 1.4–6.5)
Neutrophils Relative %: 55 %
PLATELETS: 158 10*3/uL (ref 150–440)
RBC: 5.52 MIL/uL (ref 4.40–5.90)
RDW: 15.2 % — AB (ref 11.5–14.5)
WBC: 5.8 10*3/uL (ref 3.8–10.6)

## 2015-01-21 LAB — BASIC METABOLIC PANEL
Anion gap: 10 (ref 5–15)
BUN: 12 mg/dL (ref 6–20)
CHLORIDE: 101 mmol/L (ref 101–111)
CO2: 28 mmol/L (ref 22–32)
CREATININE: 1.01 mg/dL (ref 0.61–1.24)
Calcium: 9.2 mg/dL (ref 8.9–10.3)
GFR calc non Af Amer: >60 mL/min (ref >60)
Glucose, Bld: 188 mg/dL — ABNORMAL HIGH (ref 65–99)
POTASSIUM: 4 mmol/L (ref 3.5–5.1)
SODIUM: 139 mmol/L (ref 135–145)

## 2015-01-21 LAB — AMYLASE: Amylase: 79 U/L (ref 28–100)

## 2015-01-21 MED ORDER — KETOROLAC TROMETHAMINE 60 MG/2ML IM SOLN
60.0000 mg | Freq: Once | INTRAMUSCULAR | Status: AC
  Administered 2015-01-21: 60 mg via INTRAMUSCULAR

## 2015-01-21 MED ORDER — ORPHENADRINE CITRATE ER 100 MG PO TB12: 100.0000 mg | ORAL_TABLET | Freq: Two times a day (BID) | ORAL | Status: DC

## 2015-01-21 MED ORDER — MELOXICAM 15 MG PO TABS: 15.0000 mg | ORAL_TABLET | Freq: Every day | ORAL | Status: DC

## 2015-01-21 NOTE — Discharge Instructions (Signed)
Abdominal Pain Many things can cause belly (abdominal) pain. Most times, the belly pain is not dangerous. Many cases of belly pain can be watched and treated at home. HOME CARE   Do not take medicines that help you go poop (laxatives) unless told to by your doctor.  Only take medicine as told by your doctor.  Eat or drink as told by your doctor. Your doctor will tell you if you should be on a special diet. GET HELP IF:  You do not know what is causing your belly pain.  You have belly pain while you are sick to your stomach (nauseous) or have runny poop (diarrhea).  You have pain while you pee or poop.  Your belly pain wakes you up at night.  You have belly pain that gets worse or better when you eat.  You have belly pain that gets worse when you eat fatty foods.  You have a fever. GET HELP RIGHT AWAY IF:   The pain does not go away within 2 hours.  You keep throwing up (vomiting).  The pain changes and is only in the right or left part of the belly.  You have bloody or tarry looking poop. MAKE SURE YOU:   Understand these instructions.  Will watch your condition.  Will get help right away if you are not doing well or get worse. Document Released: 10/11/2007 Document Revised: 04/29/2013 Document Reviewed: 01/01/2013 [facility] Patient Information 2015 [place], [place]. This information is not intended to replace advice given to you by your health care provider. Make sure you discuss any questions you have with your health care provider.  Pain of Unknown Etiology (Pain Without a Known Cause) You have come to your caregiver because of pain. Pain can occur in any part of the body. Often there is not a definite cause. If your laboratory (blood or urine) work was normal and X-rays or other studies were normal, your caregiver may treat you without knowing the cause of the pain. An example of this is the headache. Most headaches are diagnosed by taking a history. This means your  caregiver asks you questions about your headaches. Your caregiver determines a treatment based on your answers. Usually testing done for headaches is normal. Often testing is not done unless there is no response to medications. Regardless of where your pain is located today, you can be given medications to make you comfortable. If no physical cause of pain can be found, most cases of pain will gradually leave as suddenly as they came.  If you have a painful condition and no reason can be found for the pain, it is important that you follow up with your caregiver. If the pain becomes worse or does not go away, it may be necessary to repeat tests and look further for a possible cause.  Only take over-the-counter or prescription medicines for pain, discomfort, or fever as directed by your caregiver.  For the protection of your privacy, test results cannot be given over the phone. Make sure you receive the results of your test. Ask how these results are to be obtained if you have not been informed. It is your responsibility to obtain your test results.  You may continue all activities unless the activities cause more pain. When the pain lessens, it is important to gradually resume normal activities. Resume activities by beginning slowly and gradually increasing the intensity and duration of the activities or exercise. During periods of severe pain, bed rest may be helpful. Lorenz Coaster  or sit in any position that is comfortable.  Ice used for acute (sudden) conditions may be effective. Use a large plastic bag filled with ice and wrapped in a towel. This may provide pain relief.  See your caregiver for continued problems. Your caregiver can help or refer you for exercises or physical therapy if necessary. If you were given medications for your condition, do not drive, operate machinery or power tools, or sign legal documents for 24 hours. Do not drink alcohol, take sleeping pills, or take other medications that may  interfere with treatment. See your caregiver immediately if you have pain that is becoming worse and not relieved by medications. Document Released: 01/17/2001 Document Revised: 02/12/2013 Document Reviewed: 04/24/2005 [facility] Patient Information 2015 [place], [place]. This information is not intended to replace advice given to you by your health care provider. Make sure you discuss any questions you have with your health care provider.

## 2015-01-21 NOTE — ED Notes (Signed)
Patient c/o left sided groin and testicle pain off and on for a year.  Patient report strong odor in his urine for a month.  Patient denies fevers.

## 2015-01-21 NOTE — ED Provider Notes (Signed)
CSN: [id number]     Arrival date & time 01/21/15  0919 History   First MD Initiated Contact with Patient 01/21/15 1001     Chief Complaint  Patient presents with  . Groin Pain  . Testicle Pain    Patient states that he's here because the testicle and groin pain. He reports having a hydrocele repaired on the left side incision and a hernia repair about 15 months ago by Dr. Achilles Dunk since then he's had recurrent pain and discomfort. States he seen Dr. Achilles Dunk last time gave him pain medication were told him he uses it sparingly. He is going to the [place] now and states they've not found anything that can explain the pain he's had ultrasound CT scans etc. He states over the last few weeks his urine has become foul-smelling and he's had recurrent testicular pain pain. Came here to be evaluated to see if anything else was problem. He's also had a history of pancreatitis he states that happened after his hydrocele surgery. He  denies drinking alcohol and no clear cut reason why he developed pancreatitis was ever explained to him. He denies any shortness of breath chest pain but just reports pain in that left groin area. I have explained to him that the [facility] board expects one doctor to be managing a chronic pain situation. Explained to him to Dr. Achilles Dunk decision whether he is going to keep seeing an managing himfor this chronic condition or refer him to pain clinic and or have his primary care physician take over as far as management of chronic pain situation. We will get blood work lab work to see if something else is going on at this time. We'll plan to do any x-ray studies on him him unless we find something in the blood work or urine that warrants further testing. He states he does have appointment with Dr. Achilles Dunk in early November. (Consider location/radiation/quality/duration/timing/severity/associated sxs/prior Treatment) Patient is a 64 y.o. male presenting with groin pain and testicular pain. The  history is provided by the patient.  Groin Pain This is a chronic problem. Episode onset: over a year. The problem has not changed since onset.Associated symptoms include abdominal pain. Pertinent negatives include no chest pain, no headaches and no shortness of breath. Nothing aggravates the symptoms. Nothing relieves the symptoms. He has tried nothing for the symptoms.  Testicle Pain This is a chronic problem. Episode onset: mopre than a year ago. Associated symptoms include abdominal pain. Pertinent negatives include no chest pain, no headaches and no shortness of breath. Nothing relieves the symptoms. The treatment provided no relief.    Past Medical History  Diagnosis Date  . Hypertension   . Diabetes mellitus without complication   . Hypercholesteremia    Past Surgical History  Procedure Laterality Date  . Back surgery     History reviewed. No pertinent family history. Social History  Substance Use Topics  . Smoking status: Never Smoker   . Smokeless tobacco: None  . Alcohol Use: No    Review of Systems  Respiratory: Negative for cough and shortness of breath.   Cardiovascular: Negative for chest pain.  Gastrointestinal: Positive for abdominal pain.  Genitourinary: Positive for testicular pain.  Neurological: Negative for headaches.  All other systems reviewed and are negative. Patient reports PSA is been 3 and under. He is worried about cancer. But he is not worried about prostate cancer.   Allergies  Banana and Iodine  Home Medications   Prior to Admission  medications   Medication Sig Start Date End Date Taking? Authorizing Provider  amLODipine-atorvastatin (CADUET) 5-10 MG per tablet Take 1 tablet by mouth daily.   Yes Historical Provider, MD  aspirin 81 MG tablet Take 81 mg by mouth daily.   Yes Historical Provider, MD  atorvastatin (LIPITOR) 40 MG tablet Take 40 mg by mouth daily.   Yes Historical Provider, MD  carvedilol (COREG) 25 MG tablet Take 25 mg by mouth  2 (two) times daily with a meal.   Yes Historical Provider, MD  co-enzyme Q-10 30 MG capsule Take 100 mg by mouth daily.   Yes Historical Provider, MD  DULoxetine (CYMBALTA) 20 MG capsule Take 20 mg by mouth daily.   Yes Historical Provider, MD  insulin glargine (LANTUS) 100 UNIT/ML injection Inject 37 Units into the skin daily.   Yes Historical Provider, MD  losartan (COZAAR) 100 MG tablet Take 100 mg by mouth daily.   Yes Historical Provider, MD  prazosin (MINIPRESS) 2 MG capsule Take 2 mg by mouth at bedtime.   Yes Historical Provider, MD  sildenafil (REVATIO) 20 MG tablet Take 20 mg by mouth as needed.   Yes Historical Provider, MD  tamsulosin (FLOMAX) 0.4 MG CAPS capsule Take 0.4 mg by mouth daily.   Yes Historical Provider, MD  testosterone cypionate (DEPOTESTOTERONE CYPIONATE) 100 MG/ML injection Inject 200 mg into the muscle every 28 (twenty-eight) days. For IM use only   Yes Historical Provider, MD  meloxicam (MOBIC) 15 MG tablet Take 1 tablet (15 mg total) by mouth daily. 01/21/15   Hassan Rowan, MD  orphenadrine (NORFLEX) 100 MG tablet Take 1 tablet (100 mg total) by mouth 2 (two) times daily. 01/21/15   Hassan Rowan, MD   Meds Ordered and Administered this Visit   Medications  ketorolac (TORADOL) injection 60 mg (60 mg Intramuscular Given 01/21/15 1050)    BP 146/82 mmHg  Pulse 61  Temp(Src) 96.2 F (35.7 C) (Tympanic)  Resp 16  Ht 5\' 7"  (1.702 m)  Wt 215 lb (97.523 kg)  BMI 33.67 kg/m2  SpO2 99% No data found.   Physical Exam  Constitutional: He is oriented to person, place, and time. He appears well-developed and well-nourished.  Obese BM  HENT:  Head: Normocephalic.  Eyes: Conjunctivae are normal. Pupils are equal, round, and reactive to light.  Neck: Neck supple.  Abdominal: Soft. Bowel sounds are normal. He exhibits distension. There is tenderness. There is no rebound. Hernia confirmed negative in the left inguinal area.  Genitourinary: Penis normal.    Right  testis shows tenderness. Right testis is descended. Circumcised.  Pressure tenderness along the left testicle left inguinal area very tender to palpation and patient withdraws when examinations attempted  Musculoskeletal: Normal range of motion.  Neurological: He is alert and oriented to person, place, and time.  Skin: Skin is warm and dry. No rash noted. No erythema.  Psychiatric: He has a normal mood and affect. His behavior is normal.  Vitals reviewed.   ED Course  Procedures (including critical care time)  Labs Review Labs Reviewed  URINALYSIS COMPLETEWITH MICROSCOPIC ([facility] ONLY) - Abnormal; Notable for the following:    Specific Gravity, Urine >1.030 (*)    Protein, ur PRESENT (*)    Squamous Epithelial / LPF 0-5 (*)    All other components within normal limits  CBC WITH DIFFERENTIAL/PLATELET - Abnormal; Notable for the following:    RDW 15.2 (*)    All other components within normal limits  BASIC METABOLIC PANEL -  Abnormal; Notable for the following:    Glucose, Bld 188 (*)    All other components within normal limits  URINE CULTURE  AMYLASE  LIPASE, BLOOD   Results for orders placed or performed during the hospital encounter of 01/21/15  Urinalysis complete, with microscopic  Result Value Ref Range   Color, Urine YELLOW YELLOW   APPearance CLEAR CLEAR   Glucose, UA NEGATIVE NEGATIVE mg/dL   Bilirubin Urine NEGATIVE NEGATIVE   Ketones, ur NEGATIVE NEGATIVE mg/dL   Specific Gravity, Urine >1.030 (H) 1.005 - 1.030   Hgb urine dipstick NEGATIVE NEGATIVE   pH 6.0 5.0 - 8.0   Protein, ur PRESENT (A) NEGATIVE mg/dL   Nitrite NEGATIVE NEGATIVE   Leukocytes, UA NEGATIVE NEGATIVE   RBC / HPF 0-5 <3 RBC/hpf   WBC, UA 0-5 <3 WBC/hpf   Bacteria, UA RARE RARE   Squamous Epithelial / LPF 0-5 (A) RARE   Mucous PRESENT   CBC with Differential  Result Value Ref Range   WBC 5.8 3.8 - 10.6 K/uL   RBC 5.52 4.40 - 5.90 MIL/uL   Hemoglobin 16.6 13.0 - 18.0 g/dL   HCT 82.5 05.3 -  97.6 %   MCV 90.5 80.0 - 100.0 fL   MCH 30.1 26.0 - 34.0 pg   MCHC 33.2 32.0 - 36.0 g/dL   RDW 73.4 (H) 19.3 - 79.0 %   Platelets 158 150 - 440 K/uL   Neutrophils Relative % 55 %   Neutro Abs 3.3 1.4 - 6.5 K/uL   Lymphocytes Relative 33 %   Lymphs Abs 1.9 1.0 - 3.6 K/uL   Monocytes Relative 9 %   Monocytes Absolute 0.5 0.2 - 1.0 K/uL   Eosinophils Relative 2 %   Eosinophils Absolute 0.1 0 - 0.7 K/uL   Basophils Relative 1 %   Basophils Absolute 0.0 0 - 0.1 K/uL  Amylase  Result Value Ref Range   Amylase 79 28 - 100 U/L  Lipase, blood  Result Value Ref Range   Lipase 44 22 - 51 U/L  Basic metabolic panel  Result Value Ref Range   Sodium 139 135 - 145 mmol/L   Potassium 4.0 3.5 - 5.1 mmol/L   Chloride 101 101 - 111 mmol/L   CO2 28 22 - 32 mmol/L   Glucose, Bld 188 (H) 65 - 99 mg/dL   BUN 12 6 - 20 mg/dL   Creatinine, Ser 2.40 0.61 - 1.24 mg/dL   Calcium 9.2 8.9 - 97.3 mg/dL   GFR calc non Af Amer >60 >60 mL/min   GFR calc Af Amer >60 >60 mL/min   Anion gap 10 5 - 15   Imaging Review No results found.   Visual Acuity Review  Right Eye Distance:   Left Eye Distance:   Bilateral Distance:    Right Eye Near:   Left Eye Near:    Bilateral Near:         MDM   1. Testicular pain, left   2. Chronic abdominal pain     While waiting lab work will administer 60 Toradol IM. He reports that the Toradol did help with the pain. He understands that we really can't figure out this visit the source this chronic pain that he'll go back to urologist in November. We'll place on Mobic 15 mg and  Norflex to see if that helps.  Discussed with him that he may need to have repeat CT or imaging study if the pain persists.  Hassan Rowan, MD  01/21/15 2013 

## 2015-01-23 LAB — URINE CULTURE
CULTURE: NO GROWTH
SPECIAL REQUESTS: NORMAL

## 2015-03-08 ENCOUNTER — Emergency Department: Payer: Medicare Other

## 2015-03-08 ENCOUNTER — Encounter: Payer: Self-pay | Admitting: Emergency Medicine

## 2015-03-08 ENCOUNTER — Emergency Department
Admission: EM | Admit: 2015-03-08 | Discharge: 2015-03-08 | Disposition: A | Discharge status: Home / Self Care | Payer: Medicare Other | Attending: Emergency Medicine | Admitting: Emergency Medicine

## 2015-03-08 DIAGNOSIS — Z791 Long term (current) use of non-steroidal anti-inflammatories (NSAID): Secondary | ICD-10-CM | POA: Insufficient documentation

## 2015-03-08 DIAGNOSIS — Z7982 Long term (current) use of aspirin: Secondary | ICD-10-CM | POA: Insufficient documentation

## 2015-03-08 DIAGNOSIS — E119 Type 2 diabetes mellitus without complications: Secondary | ICD-10-CM | POA: Diagnosis not present

## 2015-03-08 DIAGNOSIS — R1030 Lower abdominal pain, unspecified: Secondary | ICD-10-CM | POA: Diagnosis present

## 2015-03-08 DIAGNOSIS — Z794 Long term (current) use of insulin: Secondary | ICD-10-CM | POA: Diagnosis not present

## 2015-03-08 DIAGNOSIS — G8929 Other chronic pain: Secondary | ICD-10-CM | POA: Insufficient documentation

## 2015-03-08 DIAGNOSIS — I10 Essential (primary) hypertension: Secondary | ICD-10-CM | POA: Diagnosis not present

## 2015-03-08 DIAGNOSIS — R109 Unspecified abdominal pain: Secondary | ICD-10-CM

## 2015-03-08 DIAGNOSIS — Z79899 Other long term (current) drug therapy: Secondary | ICD-10-CM | POA: Diagnosis not present

## 2015-03-08 DIAGNOSIS — M5432 Sciatica, left side: Secondary | ICD-10-CM

## 2015-03-08 DIAGNOSIS — N5082 Scrotal pain: Secondary | ICD-10-CM | POA: Diagnosis not present

## 2015-03-08 LAB — COMPREHENSIVE METABOLIC PANEL
ALBUMIN: 4.1 g/dL (ref 3.5–5.0)
ALK PHOS: 53 U/L (ref 38–126)
ALT: 21 U/L (ref 17–63)
ANION GAP: 7 (ref 5–15)
AST: 20 U/L (ref 15–41)
BUN: 18 mg/dL (ref 6–20)
CALCIUM: 9.3 mg/dL (ref 8.9–10.3)
CO2: 27 mmol/L (ref 22–32)
Chloride: 104 mmol/L (ref 101–111)
Creatinine, Ser: 0.95 mg/dL (ref 0.61–1.24)
GFR calc Af Amer: >60 mL/min (ref >60)
GFR calc non Af Amer: >60 mL/min (ref >60)
GLUCOSE: 196 mg/dL — AB (ref 65–99)
Potassium: 3.8 mmol/L (ref 3.5–5.1)
SODIUM: 138 mmol/L (ref 135–145)
Total Bilirubin: 0.6 mg/dL (ref 0.3–1.2)
Total Protein: 7.8 g/dL (ref 6.5–8.1)

## 2015-03-08 LAB — URINALYSIS COMPLETE WITH MICROSCOPIC (ARMC ONLY)
BACTERIA UA: NONE SEEN
Bilirubin Urine: NEGATIVE
Glucose, UA: 150 mg/dL — AB
KETONES UR: NEGATIVE mg/dL
Leukocytes, UA: NEGATIVE
NITRITE: NEGATIVE
PH: 5 (ref 5.0–8.0)
PROTEIN: NEGATIVE mg/dL
Specific Gravity, Urine: 1.023 (ref 1.005–1.030)

## 2015-03-08 LAB — CBC
HCT: 50.9 % (ref 40.0–52.0)
HEMOGLOBIN: 17.1 g/dL (ref 13.0–18.0)
MCH: 30 pg (ref 26.0–34.0)
MCHC: 33.5 g/dL (ref 32.0–36.0)
MCV: 89.6 fL (ref 80.0–100.0)
PLATELETS: 132 10*3/uL — AB (ref 150–440)
RBC: 5.68 MIL/uL (ref 4.40–5.90)
RDW: 14.1 % (ref 11.5–14.5)
WBC: 3.9 10*3/uL (ref 3.8–10.6)

## 2015-03-08 LAB — CHLAMYDIA/NGC RT PCR (ARMC ONLY)
Chlamydia Tr: NOT DETECTED
N GONORRHOEAE: NOT DETECTED

## 2015-03-08 LAB — LIPASE, BLOOD: Lipase: 128 U/L — ABNORMAL HIGH (ref 11–51)

## 2015-03-08 MED ORDER — OXYCODONE-ACETAMINOPHEN 5-325 MG PO TABS
1.0000 | ORAL_TABLET | Freq: Once | ORAL | Status: AC
  Administered 2015-03-08: 1 via ORAL
  Filled 2015-03-08: qty 1

## 2015-03-08 MED ORDER — NAPROXEN 500 MG PO TABS
250.0000 mg | ORAL_TABLET | Freq: Once | ORAL | Status: AC
  Administered 2015-03-08: 250 mg via ORAL
  Filled 2015-03-08: qty 1

## 2015-03-08 MED ORDER — NAPROXEN 250 MG PO TABS: 250.0000 mg | ORAL_TABLET | Freq: Two times a day (BID) | ORAL | Status: DC

## 2015-03-08 NOTE — Discharge Instructions (Signed)

## 2015-03-08 NOTE — ED Provider Notes (Signed)
[facility] Emergency Department Provider Note  ____________________________________________  Time seen: Approximately 12:30 PM  I have reviewed the triage vital signs and the nursing notes.   HISTORY  Chief Complaint Abdominal Pain    HPI Ian Lucas is a 64 y.o. male with a history of diabetes and hydrocele who is presenting for chronic abdominal pain which he says has been going on for about a year.  He says the pain is dull and cramping in in his lower abdomen through down to his pelvis and into his penis and groin. He has been seen by Dr. Eather Colas of urology for a hydrocele which he had repaired 2 about one year ago. He says that since his surgeries he has been having this pain. He denies any nausea vomiting or diarrhea at this time. Says that he does have some radiating pain down the left thigh posteriorly. He denies any weakness, numbness or incontinence. He says he has been to the [place], his primary care doctor as well as Dr. Theodoro Grist for this pain and decided to come in [place] to see if we could find something different.Patient says he has a history of pancreatitis but does not drink. He says he still has his gallbladder. He says that his urine has a "foul smell" but there is no burning or discharge with urination. He says that the pain worsens throughout the day with movement and walking. He says that he had a nerve block to his left groin several months ago which eliminated the pain. However came back after about a week after the nerve block. He has been taking monthly oxycodone's which he says helps with the pain.   Past Medical History  Diagnosis Date  . Hypertension   . Diabetes mellitus without complication (HCC)   . Hypercholesteremia     There are no active problems to display for this patient.   Past Surgical History  Procedure Laterality Date  . Back surgery      Current Outpatient Rx  Name  Route  Sig  Dispense  Refill  .  amLODipine-atorvastatin (CADUET) 5-10 MG per tablet   Oral   Take 1 tablet by mouth daily.         Marland Kitchen aspirin 81 MG tablet   Oral   Take 81 mg by mouth daily.         Marland Kitchen atorvastatin (LIPITOR) 40 MG tablet   Oral   Take 40 mg by mouth daily.         . carvedilol (COREG) 25 MG tablet   Oral   Take 25 mg by mouth 2 (two) times daily with a meal.         . co-enzyme Q-10 30 MG capsule   Oral   Take 100 mg by mouth daily.         . DULoxetine (CYMBALTA) 20 MG capsule   Oral   Take 20 mg by mouth daily.         . insulin glargine (LANTUS) 100 UNIT/ML injection   Subcutaneous   Inject 37 Units into the skin daily.         Marland Kitchen losartan (COZAAR) 100 MG tablet   Oral   Take 100 mg by mouth daily.         . meloxicam (MOBIC) 15 MG tablet   Oral   Take 1 tablet (15 mg total) by mouth daily.   30 tablet   1   . orphenadrine (NORFLEX) 100 MG tablet  Oral   Take 1 tablet (100 mg total) by mouth 2 (two) times daily.   60 tablet   0   . prazosin (MINIPRESS) 2 MG capsule   Oral   Take 2 mg by mouth at bedtime.         . sildenafil (REVATIO) 20 MG tablet   Oral   Take 20 mg by mouth as needed.         . tamsulosin (FLOMAX) 0.4 MG CAPS capsule   Oral   Take 0.4 mg by mouth daily.         Marland Kitchen testosterone cypionate (DEPOTESTOTERONE CYPIONATE) 100 MG/ML injection   Intramuscular   Inject 200 mg into the muscle every 28 (twenty-eight) days. For IM use only           Allergies Banana and Iodine  History reviewed. No pertinent family history.  Social History Social History  Substance Use Topics  . Smoking status: Never Smoker   . Smokeless tobacco: None  . Alcohol Use: No    Review of Systems Constitutional: No fever/chills Eyes: No visual changes. ENT: No sore throat. Cardiovascular: Denies chest pain. Respiratory: Denies shortness of breath. Gastrointestinal: Lower abdominal pain.  No nausea, no vomiting.  No diarrhea.  No  constipation. Genitourinary: Negative for dysuria. Musculoskeletal: Negative for back pain. Skin: Negative for rash. Neurological: Negative for headaches, focal weakness or numbness.  10-point ROS otherwise negative.  ____________________________________________   PHYSICAL EXAM:  VITAL SIGNS: ED Triage Vitals  Enc Vitals Group     BP 03/08/15 1025 152/71 mmHg     Pulse Rate 03/08/15 1025 69     Resp 03/08/15 1025 20     Temp 03/08/15 1025 98.5 F (36.9 C)     Temp Source 03/08/15 1025 Oral     SpO2 03/08/15 1025 95 %     Weight 03/08/15 1020 215 lb (97.523 kg)     Height 03/08/15 1020 5\' 7"  (1.702 m)     Head Cir --      Peak Flow --      Pain Score 03/08/15 1020 7     Pain Loc --      Pain Edu? --      Excl. in GC? --     Constitutional: Alert and oriented. Well appearing and in no acute distress. Eyes: Conjunctivae are normal. PERRL. EOMI. Head: Atraumatic. Nose: No congestion/rhinnorhea. Mouth/Throat: Mucous membranes are moist.  Oropharynx non-erythematous. Neck: No stridor.   Cardiovascular: Normal rate, regular rhythm. Grossly normal heart sounds.  Good peripheral circulation. Respiratory: Normal respiratory effort.  No retractions. Lungs CTAB. Gastrointestinal: Soft with mild diffuse tenderness to palpation. The patient does not wince when palpated. There is no rebound or guarding. No distention. No abdominal bruits. No CVA tenderness. Genitourinary: Swelling to the left hemiscrotum which is tender to palpation. There is no overlying erythema or induration. There is diffuse tenderness throughout the scrotum. Normal circumcised penis. Musculoskeletal: Positive straight leg raise on the left with only mild pain that goes just distal to left knee.  No joint effusions. Neurologic:  Normal speech and language. No gross focal neurologic deficits are appreciated. No gait instability. Skin:  Skin is warm, dry and intact. No rash noted. Psychiatric: Mood and affect are  normal. Speech and behavior are normal.  ____________________________________________   LABS (all labs ordered are listed, but only abnormal results are displayed)  Labs Reviewed  LIPASE, BLOOD - Abnormal; Notable for the following:    Lipase 128 (*)  All other components within normal limits  COMPREHENSIVE METABOLIC PANEL - Abnormal; Notable for the following:    Glucose, Bld 196 (*)    All other components within normal limits  CBC - Abnormal; Notable for the following:    Platelets 132 (*)    All other components within normal limits  URINALYSIS COMPLETEWITH MICROSCOPIC ([facility] ONLY) - Abnormal; Notable for the following:    Color, Urine YELLOW (*)    APPearance CLEAR (*)    Glucose, UA 150 (*)    Hgb urine dipstick 1+ (*)    Squamous Epithelial / LPF 0-5 (*)    All other components within normal limits  CHLAMYDIA/NGC RT PCR ([facility] ONLY)   ____________________________________________  EKG   ____________________________________________  RADIOLOGY  Testicular ultrasound with small right-sided hydrocele. Pelvic x-ray without arthropathy or fracture. ____________________________________________   PROCEDURES   ____________________________________________   INITIAL IMPRESSION / ASSESSMENT AND PLAN / ED COURSE  Pertinent labs & imaging results that were available during my care of the patient were reviewed by me and considered in my medical decision making (see chart for details).  ----------------------------------------- 2:56 PM on 03/08/2015 -----------------------------------------  Patient is resting comfortably at this time. I discussed the lab as well as imaging results with him as well as his wife. Since the pain is improved in the past with a nerve block I recommended he follow up with orthopedics. He said that him and his wife had a concern about cancer because they've a friend with a similar pain who developed cancer. However, the patient has not  had any weight loss over the past year. He has also had 2 colonoscopies which were negative for cancer. I do think this is a reasonable concern so an additional orthopedics I do recommend that he follows up with his primary care doctor. Also, we discussed the patient's mildly elevated lipase which is much lower than it has been in the past. Also patient is not having any nausea or vomiting. We did discuss keeping a close eye on his abdominal pain as well as his diet and if he experiences worsening abdominal pain or nausea or vomiting that he should return to the emergency department immediately.The patient as well as his wife understand the plan and are willing to comply. There also appears to be a sciatic component to the left lower extremity. ____________________________________________   FINAL CLINICAL IMPRESSION(S) / ED DIAGNOSES  Final diagnoses:  Scrotal pain   lower abdominal pain. Sciatica.    Myrna Blazer, MD 03/08/15 [phone]

## 2015-03-08 NOTE — ED Notes (Signed)
Pt to ed with c/o abd pain x several months, denies n/v/d.

## 2015-05-05 ENCOUNTER — Other Ambulatory Visit: Payer: Self-pay | Admitting: Orthopedic Surgery

## 2015-05-05 DIAGNOSIS — M4120 Other idiopathic scoliosis, site unspecified: Secondary | ICD-10-CM

## 2015-05-27 ENCOUNTER — Ambulatory Visit: Payer: Medicare Other

## 2015-06-07 LAB — CBC AND DIFFERENTIAL
HEMATOCRIT: 52 % (ref 41–53)
HEMOGLOBIN: 16.9 g/dL (ref 13.5–17.5)
Platelets: 145 10*3/uL — AB (ref 150–399)
WBC: 5.1 10^3/mL

## 2015-06-07 LAB — LIPID PANEL
Cholesterol: 177 mg/dL (ref 0–200)
HDL: 56 mg/dL (ref 35–70)
LDL CALC: 104 mg/dL
Triglycerides: 83 mg/dL (ref 40–160)

## 2015-06-07 LAB — HEPATIC FUNCTION PANEL
ALT: 25 U/L (ref 10–40)
AST: 14 U/L (ref 14–40)
Alkaline Phosphatase: 79 U/L (ref 25–125)
Bilirubin, Total: 0.6 mg/dL

## 2015-06-07 LAB — BASIC METABOLIC PANEL
BUN: 15 mg/dL (ref 4–21)
Creatinine: 1.1 mg/dL (ref 0.6–1.3)
GLUCOSE: 173 mg/dL
Potassium: 4 mmol/L (ref 3.4–5.3)
SODIUM: 140 mmol/L (ref 137–147)

## 2015-06-07 LAB — PSA: PSA: 2.71

## 2015-06-07 LAB — HEMOGLOBIN A1C: Hemoglobin A1C: 7.6

## 2015-06-18 ENCOUNTER — Ambulatory Visit (INDEPENDENT_AMBULATORY_CARE_PROVIDER_SITE_OTHER): Payer: PPO | Admitting: Family Medicine

## 2015-06-18 ENCOUNTER — Encounter: Payer: Self-pay | Admitting: Family Medicine

## 2015-06-18 VITALS — BP 124/76 | HR 72 | Temp 97.8°F | Resp 16 | Ht 67.0 in | Wt 221.1 lb

## 2015-06-18 DIAGNOSIS — G629 Polyneuropathy, unspecified: Secondary | ICD-10-CM | POA: Diagnosis not present

## 2015-06-18 DIAGNOSIS — E785 Hyperlipidemia, unspecified: Secondary | ICD-10-CM | POA: Insufficient documentation

## 2015-06-18 DIAGNOSIS — Z8619 Personal history of other infectious and parasitic diseases: Secondary | ICD-10-CM | POA: Insufficient documentation

## 2015-06-18 DIAGNOSIS — E1140 Type 2 diabetes mellitus with diabetic neuropathy, unspecified: Secondary | ICD-10-CM | POA: Insufficient documentation

## 2015-06-18 DIAGNOSIS — E1142 Type 2 diabetes mellitus with diabetic polyneuropathy: Secondary | ICD-10-CM

## 2015-06-18 DIAGNOSIS — M5441 Lumbago with sciatica, right side: Secondary | ICD-10-CM

## 2015-06-18 DIAGNOSIS — N138 Other obstructive and reflux uropathy: Secondary | ICD-10-CM

## 2015-06-18 DIAGNOSIS — G2581 Restless legs syndrome: Secondary | ICD-10-CM | POA: Diagnosis not present

## 2015-06-18 DIAGNOSIS — G4733 Obstructive sleep apnea (adult) (pediatric): Secondary | ICD-10-CM | POA: Diagnosis not present

## 2015-06-18 DIAGNOSIS — M5442 Lumbago with sciatica, left side: Secondary | ICD-10-CM

## 2015-06-18 DIAGNOSIS — N401 Enlarged prostate with lower urinary tract symptoms: Secondary | ICD-10-CM | POA: Diagnosis not present

## 2015-06-18 DIAGNOSIS — L280 Lichen simplex chronicus: Secondary | ICD-10-CM | POA: Insufficient documentation

## 2015-06-18 DIAGNOSIS — R972 Elevated prostate specific antigen [PSA]: Secondary | ICD-10-CM | POA: Insufficient documentation

## 2015-06-18 DIAGNOSIS — I10 Essential (primary) hypertension: Secondary | ICD-10-CM | POA: Diagnosis not present

## 2015-06-18 DIAGNOSIS — G8929 Other chronic pain: Secondary | ICD-10-CM | POA: Insufficient documentation

## 2015-06-18 DIAGNOSIS — F4310 Post-traumatic stress disorder, unspecified: Secondary | ICD-10-CM | POA: Insufficient documentation

## 2015-06-18 LAB — POCT GLYCOSYLATED HEMOGLOBIN (HGB A1C): Hemoglobin A1C: 7.8

## 2015-06-18 MED ORDER — PREGABALIN 75 MG PO CAPS: 75.0000 mg | ORAL_CAPSULE | Freq: Two times a day (BID) | ORAL | Status: DC

## 2015-06-18 MED ORDER — NATEGLINIDE 60 MG PO TABS: 60.0000 mg | ORAL_TABLET | Freq: Three times a day (TID) | ORAL | Status: DC

## 2015-06-18 NOTE — Progress Notes (Signed)
Name: Ian Lucas   MRN: [id number]    DOB: 02/18/1951   Date:06/18/2015       Progress Note  Subjective  Chief Complaint  Chief Complaint  Patient presents with  . Medication Refill  . Diabetes    Checks once daily, Low-120 Average-148 High-150  . Hypertension    HPI  Diabetes type 2 with neuropathy: he is going to the [place] every 3 months, currently on Lantus 40 units. Per patient last hgbA1C was high at 8.9%, he states he tried Gabapentin but it made him feel anxious. He has daily pain on his legs, at this time 4/10. He denies polyphagia, polyuria or polydipsia. He has nocturia once or twice per night secondary to BPH .   HTN: he has been taking medication, denies side effects, no chest pain or palpitation.   Hyperlipidemia: taking Atorvastatin, no myalgia. He states he had labs at the [place] and we will get results.   BPH: sees Dr. Achilles Dunk, he has history of kidney stones, epididymitis and hydrocele repair left side.   RLS: he kicks at night, also talks during the night and now they sleep in separate rooms  OSA: he has the machine at home, but is not wearing, he was removing it during the night without knowing it. Advised to start using it again.   Patient Active Problem List   Diagnosis Date Noted  . Elevated prostate specific antigen (PSA) 06/18/2015  . Benign essential HTN 06/18/2015  . Chronic LBP 06/18/2015  . Diabetic neuropathy (HCC) 06/18/2015  . Posttraumatic stress disorder 06/18/2015  . Dyslipidemia 06/18/2015  . Cannot sleep 06/18/2015  . Lichen simplex 06/18/2015  . History of herpes zoster 06/18/2015  . Restless leg 06/18/2015  . 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 Calculus of kidney 08/21/2013  . History of pancreatitis 08/15/2013  . Hydrocele 05/19/2013  . Hernia, inguinal, left 05/19/2013  . Arthritis of knee, degenerative 10/30/2012  . Lumbar canal stenosis 10/30/2012  . Chronic prostatitis 06/19/2012  . Hypogonadism in male 06/19/2012   . 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 Benign prostatic hyperplasia with urinary obstruction 06/19/2012    Past Surgical History  Procedure Laterality Date  . Back surgery      History reviewed. No pertinent family history.  Social History   Social History  . Marital Status: Married    Spouse Name: N/A  . Number of Children: N/A  . Years of Education: N/A   Occupational History  . Not on file.   Social History Main Topics  . Smoking status: Former Smoker -- 0.25 packs/day for 30 years    Types: Cigarettes    Start date: 05/08/1968    Quit date: 06/17/1998  . Smokeless tobacco: Never Used  . Alcohol Use: No  . Drug Use: No  . Sexual Activity:    Partners: Female   Other Topics Concern  . Not on file   Social History Narrative     Current outpatient prescriptions:  .  amLODipine-atorvastatin (CADUET) 5-10 MG per tablet, Take 1 tablet by mouth daily., Disp: , Rfl:  .  aspirin 81 MG tablet, Take 81 mg by mouth daily., Disp: , Rfl:  .  atorvastatin (LIPITOR) 40 MG tablet, Take 40 mg by mouth daily., Disp: , Rfl:  .  carvedilol (COREG) 25 MG tablet, Take 25 mg by mouth 2 (two) times daily with a meal., Disp: , Rfl:  .  co-enzyme Q-10 30 MG capsule, Take 100 mg by  mouth daily., Disp: , Rfl:  .  DULoxetine (CYMBALTA) 20 MG capsule, Take 20 mg by mouth daily., Disp: , Rfl:  .  insulin glargine (LANTUS) 100 UNIT/ML injection, Inject 37 Units into the skin daily., Disp: , Rfl:  .  losartan (COZAAR) 100 MG tablet, Take 100 mg by mouth daily., Disp: , Rfl:  .  prazosin (MINIPRESS) 2 MG capsule, Take 2 mg by mouth at bedtime., Disp: , Rfl:  .  sildenafil (REVATIO) 20 MG tablet, Take 20 mg by mouth as needed., Disp: , Rfl:  .  tamsulosin (FLOMAX) 0.4 MG CAPS capsule, Take 0.4 mg by mouth daily., Disp: , Rfl:  .  testosterone cypionate (DEPOTESTOTERONE CYPIONATE) 100 MG/ML injection, Inject 200 mg into the muscle every 28 (twenty-eight) days. For IM use  only, Disp: , Rfl:   Allergies  Allergen Reactions  . Banana Shortness Of Breath  . Codeine Itching  . Iodine Swelling  . Ace Inhibitors Cough  . Gabapentin     Anxious and jumpy   . Metformin     diarrhea     ROS  Constitutional: Negative for fever , he has fatigue Respiratory: Negative for cough and shortness of breath.   Cardiovascular: Negative for chest pain or palpitations.  Gastrointestinal: Negative for abdominal pain, no bowel changes.  Musculoskeletal: Negative for gait problem or joint swelling.  Skin: Negative for rash.  Neurological: Negative for dizziness or headache.  No other specific complaints in a complete review of systems (except as listed in HPI above).  Objective  Filed Vitals:   06/18/15 1003  BP: 124/76  Pulse: 72  Temp: 97.8 F (36.6 C)  TempSrc: Oral  Resp: 16  Height: 5\' 7"  (1.702 m)  Weight: 221 lb 1.6 oz (100.29 kg)  SpO2: 96%    Body mass index is 34.62 kg/(m^2).  Physical Exam  Constitutional: Patient appears well-developed  Obese No distress.  HEENT: head atraumatic, normocephalic, pupils equal and reactive to light, neck supple, throat within normal limits Cardiovascular: Normal rate, regular rhythm and normal heart sounds.  No murmur heard. Trace  BLE edema. Pulmonary/Chest: Effort normal and breath sounds normal. No respiratory distress. Abdominal: Soft.  There is no tenderness. Psychiatric: Patient has a normal mood and affect. behavior is normal. Judgment and thought content normal.   PHQ2/9: Depression screen PHQ 2/9 06/18/2015  Decreased Interest 0  Down, Depressed, Hopeless 0  PHQ - 2 Score 0    Fall Risk: Fall Risk  06/18/2015  Falls in the past year? Yes  Number falls in past yr: 1  Injury with Fall? No    Functional Status Survey: Is the patient deaf or have difficulty hearing?: No Does the patient have difficulty seeing, even when wearing glasses/contacts?: No Does the patient have difficulty  concentrating, remembering, or making decisions?: No Does the patient have difficulty walking or climbing stairs?: No Does the patient have difficulty dressing or bathing?: No Does the patient have difficulty doing errands alone such as visiting a doctor's office or shopping?: No   Assessment & Plan   1. Diabetic polyneuropathy associated with type 2 diabetes mellitus (HCC)  We will try Starlix , he would like inexpensive medications. hgbA1C almost at goal.  - POCT glycosylated hemoglobin (Hb A1C) - pregabalin (LYRICA) 75 MG capsule; Take 1 capsule (75 mg total) by mouth 2 (two) times daily.  Dispense: 60 capsule; Refill: 2  2. Benign essential HTN  At goal, continue medication   3. Dyslipidemia  We will review  labs from [place] when available  4. Restless leg  - pregabalin (LYRICA) 75 MG capsule; Take 1 capsule (75 mg total) by mouth 2 (two) times daily.  Dispense: 60 capsule; Refill: 2  5. Benign prostatic hyperplasia with urinary obstruction  Continue follow up with Dr. Achilles Dunk  6. OSA (obstructive sleep apnea)  Explained importance of using CPAP every night  7. Polyneuropathy (HCC)  Could not tolerate GAbapentin, Elavil can cause urinary retention we will try Lyrica.  - pregabalin (LYRICA) 75 MG capsule; Take 1 capsule (75 mg total) by mouth 2 (two) times daily.  Dispense: 60 capsule; Refill: 2

## 2015-06-22 ENCOUNTER — Ambulatory Visit: Payer: PPO

## 2015-06-23 ENCOUNTER — Encounter: Payer: Self-pay | Admitting: Family Medicine

## 2015-06-25 ENCOUNTER — Encounter: Payer: Self-pay | Admitting: Family Medicine

## 2015-07-26 DIAGNOSIS — E119 Type 2 diabetes mellitus without complications: Secondary | ICD-10-CM | POA: Diagnosis not present

## 2015-08-03 DIAGNOSIS — E291 Testicular hypofunction: Secondary | ICD-10-CM | POA: Diagnosis not present

## 2015-08-03 DIAGNOSIS — N138 Other obstructive and reflux uropathy: Secondary | ICD-10-CM | POA: Diagnosis not present

## 2015-08-03 DIAGNOSIS — Z91018 Allergy to other foods: Secondary | ICD-10-CM | POA: Diagnosis not present

## 2015-08-03 DIAGNOSIS — R1032 Left lower quadrant pain: Secondary | ICD-10-CM | POA: Diagnosis not present

## 2015-08-03 DIAGNOSIS — R972 Elevated prostate specific antigen [PSA]: Secondary | ICD-10-CM | POA: Diagnosis not present

## 2015-08-03 DIAGNOSIS — F17210 Nicotine dependence, cigarettes, uncomplicated: Secondary | ICD-10-CM | POA: Diagnosis not present

## 2015-08-03 DIAGNOSIS — I10 Essential (primary) hypertension: Secondary | ICD-10-CM | POA: Diagnosis not present

## 2015-08-03 DIAGNOSIS — N200 Calculus of kidney: Secondary | ICD-10-CM | POA: Diagnosis not present

## 2015-08-03 DIAGNOSIS — Z6833 Body mass index (BMI) 33.0-33.9, adult: Secondary | ICD-10-CM | POA: Diagnosis not present

## 2015-08-03 DIAGNOSIS — Z794 Long term (current) use of insulin: Secondary | ICD-10-CM | POA: Diagnosis not present

## 2015-08-03 DIAGNOSIS — Z885 Allergy status to narcotic agent status: Secondary | ICD-10-CM | POA: Diagnosis not present

## 2015-08-03 DIAGNOSIS — E119 Type 2 diabetes mellitus without complications: Secondary | ICD-10-CM | POA: Diagnosis not present

## 2015-08-03 DIAGNOSIS — I878 Other specified disorders of veins: Secondary | ICD-10-CM | POA: Diagnosis not present

## 2015-08-03 DIAGNOSIS — Z79899 Other long term (current) drug therapy: Secondary | ICD-10-CM | POA: Diagnosis not present

## 2015-08-03 DIAGNOSIS — N403 Nodular prostate with lower urinary tract symptoms: Secondary | ICD-10-CM | POA: Diagnosis not present

## 2015-08-03 DIAGNOSIS — R339 Retention of urine, unspecified: Secondary | ICD-10-CM | POA: Diagnosis not present

## 2015-09-14 ENCOUNTER — Encounter: Payer: Self-pay | Admitting: Family Medicine

## 2015-09-14 ENCOUNTER — Ambulatory Visit (INDEPENDENT_AMBULATORY_CARE_PROVIDER_SITE_OTHER): Payer: PPO | Admitting: Family Medicine

## 2015-09-14 VITALS — BP 122/80 | HR 71 | Temp 98.6°F | Resp 16 | Ht 67.0 in | Wt 226.2 lb

## 2015-09-14 DIAGNOSIS — I10 Essential (primary) hypertension: Secondary | ICD-10-CM

## 2015-09-14 DIAGNOSIS — E785 Hyperlipidemia, unspecified: Secondary | ICD-10-CM | POA: Diagnosis not present

## 2015-09-14 DIAGNOSIS — E1142 Type 2 diabetes mellitus with diabetic polyneuropathy: Secondary | ICD-10-CM | POA: Diagnosis not present

## 2015-09-14 DIAGNOSIS — Z1159 Encounter for screening for other viral diseases: Secondary | ICD-10-CM

## 2015-09-14 DIAGNOSIS — G4733 Obstructive sleep apnea (adult) (pediatric): Secondary | ICD-10-CM | POA: Diagnosis not present

## 2015-09-14 DIAGNOSIS — Z23 Encounter for immunization: Secondary | ICD-10-CM | POA: Diagnosis not present

## 2015-09-14 DIAGNOSIS — G2581 Restless legs syndrome: Secondary | ICD-10-CM

## 2015-09-14 DIAGNOSIS — G629 Polyneuropathy, unspecified: Secondary | ICD-10-CM | POA: Diagnosis not present

## 2015-09-14 LAB — POCT GLYCOSYLATED HEMOGLOBIN (HGB A1C): HEMOGLOBIN A1C: 6.8

## 2015-09-14 LAB — POCT UA - MICROALBUMIN: Microalbumin Ur, POC: 50 mg/L

## 2015-09-14 MED ORDER — PREGABALIN 150 MG PO CAPS: 150.0000 mg | ORAL_CAPSULE | Freq: Two times a day (BID) | ORAL | Status: DC

## 2015-09-14 NOTE — Progress Notes (Addendum)
Name: Ian Lucas   MRN: [id number]    DOB: Sep 07, 1950   Date:09/14/2015       Progress Note  Subjective  Chief Complaint  Chief Complaint  Patient presents with  . Medication Refill    3 month F/U  . Diabetes    patient checks his blood sugar once a day. highest 116-120 & lowest 70's  . Hypertension    no negative sx  . Hyperlipidemia  . Testicular hypofunction    HPI  Diabetes type 2 with neuropathy: he is going to the [place] every 3 months, currently on Lantus 40 units. Per patient last hgbA1C was high at 8.9% but today is at goal at 6.8% . He  states he tried Gabapentin but it made him feel anxious, we started him on Lyrica 75 mg and burning on his legs has improved, but RLS is controlled with Lyrica. Discussed increasing dose and he is willing to try. He has daily pain on his legs, at this time 3/10. He denies polyphagia, polyuria or polydipsia. He has nocturia once or twice per night secondary to BPH . He has proteinuria also, urine micro 50 and is on ARB.  HTN: he has been taking medication, denies side effects, no chest pain or palpitation.   Hyperlipidemia: taking Atorvastatin, no myalgia. Reviewed [facility] done at [facility]  BPH: sees Dr. Achilles Dunk, he has history of kidney stones, epididymitis and hydrocele repair left side.   RLS: he is doing very well on Lyrica, no longer kicking all night.   OSA: he has the machine at home, but is not wearing, he was removing it during the night without knowing it. Advised to start using it again.   Testicular Dysfunction: continue follow up with Dr. Achilles Dunk.    Patient Active Problem List   Diagnosis Date Noted  . Elevated prostate specific antigen (PSA) 06/18/2015  . Benign essential HTN 06/18/2015  . Chronic LBP 06/18/2015  . Diabetic neuropathy (HCC) 06/18/2015  . Posttraumatic stress disorder 06/18/2015  . Dyslipidemia 06/18/2015  . Lichen simplex 06/18/2015  . History of herpes zoster 06/18/2015  . Restless leg 06/18/2015  . OSA  (obstructive sleep apnea) 06/18/2015  . Polyneuropathy (HCC) 06/18/2015  . 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 Calculus of kidney 08/21/2013  . History of pancreatitis 08/15/2013  . Hydrocele 05/19/2013  . Arthritis of knee, degenerative 10/30/2012  . Lumbar canal stenosis 10/30/2012  . Chronic prostatitis 06/19/2012  . Hypogonadism in male 06/19/2012  . 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 Benign prostatic hyperplasia with urinary obstruction 06/19/2012    Past Surgical History  Procedure Laterality Date  . Back surgery    . Hernia repair    . Hydrocele excision / repair Left   . Circumcision N/A     Family History  Problem Relation Age of Onset  . Stroke Mother   . Diabetes Father     Social History   Social History  . Marital Status: Married    Spouse Name: N/A  . Number of Children: N/A  . Years of Education: N/A   Occupational History  . Not on file.   Social History Main Topics  . Smoking status: Former Smoker -- 0.25 packs/day for 30 years    Types: Cigarettes    Start date: 05/08/1968    Quit date: 06/17/1998  . Smokeless tobacco: Never Used  . Alcohol Use: No  . Drug Use: No  . Sexual Activity:    Partners: Female  Other Topics Concern  . Not on file   Social History Narrative     Current outpatient prescriptions:  .  amLODipine-atorvastatin (CADUET) 5-10 MG per tablet, Take 1 tablet by mouth daily., Disp: , Rfl:  .  aspirin 81 MG tablet, Take 81 mg by mouth daily., Disp: , Rfl:  .  atorvastatin (LIPITOR) 40 MG tablet, Take 40 mg by mouth daily., Disp: , Rfl:  .  carvedilol (COREG) 25 MG tablet, Take 25 mg by mouth 2 (two) times daily with a meal., Disp: , Rfl:  .  co-enzyme Q-10 30 MG capsule, Take 100 mg by mouth daily., Disp: , Rfl:  .  DULoxetine (CYMBALTA) 20 MG capsule, Take 20 mg by mouth daily., Disp: , Rfl:  .  insulin glargine (LANTUS) 100 UNIT/ML injection, Inject 37 Units into the skin daily.,  Disp: , Rfl:  .  losartan (COZAAR) 100 MG tablet, Take 100 mg by mouth daily., Disp: , Rfl:  .  nateglinide (STARLIX) 60 MG tablet, Take 1 tablet (60 mg total) by mouth 3 (three) times daily with meals., Disp: 90 tablet, Rfl: 2 .  prazosin (MINIPRESS) 2 MG capsule, Take 2 mg by mouth at bedtime., Disp: , Rfl:  .  pregabalin (LYRICA) 150 MG capsule, Take 1 capsule (150 mg total) by mouth 2 (two) times daily., Disp: 60 capsule, Rfl: 5 .  sildenafil (REVATIO) 20 MG tablet, Take 20 mg by mouth as needed., Disp: , Rfl:  .  tamsulosin (FLOMAX) 0.4 MG CAPS capsule, Take 0.4 mg by mouth daily., Disp: , Rfl:  .  testosterone cypionate (DEPOTESTOTERONE CYPIONATE) 100 MG/ML injection, Inject 200 mg into the muscle every 28 (twenty-eight) days. For IM use only, Disp: , Rfl:   Allergies  Allergen Reactions  . Banana Shortness Of Breath  . Codeine Itching  . Iodine Swelling  . Ace Inhibitors Cough  . Gabapentin     Anxious and jumpy   . Metformin     diarrhea     ROS  Ten systems reviewed and is negative except as mentioned in HPI   Objective  Filed Vitals:   09/14/15 0913  BP: 122/80  Pulse: 71  Temp: 98.6 F (37 C)  TempSrc: Oral  Resp: 16  Height: 5\' 7"  (1.702 m)  Weight: 226 lb 3.2 oz (102.604 kg)  SpO2: 97%    Body mass index is 35.42 kg/(m^2).  Physical Exam  Constitutional: Patient appears well-developed and well-nourished. Obese  No distress.  HEENT: head atraumatic, normocephalic, pupils equal and reactive to light, e neck supple, throat within normal limits Cardiovascular: Normal rate, regular rhythm and normal heart sounds.  No murmur heard. No BLE edema. Pulmonary/Chest: Effort normal and breath sounds normal. No respiratory distress. Abdominal: Soft.  There is no tenderness. Psychiatric: Patient has a normal mood and affect. behavior is normal. Judgment and thought content normal.  Recent Results (from the past 2160 hour(s))  POCT glycosylated hemoglobin (Hb A1C)      Status: Abnormal   Collection Time: 06/18/15 10:42 AM  Result Value Ref Range   Hemoglobin A1C 7.8   POCT HgB A1C     Status: Abnormal   Collection Time: 09/14/15  9:41 AM  Result Value Ref Range   Hemoglobin A1C 6.8   POCT UA - Microalbumin     Status: Abnormal   Collection Time: 09/14/15  9:42 AM  Result Value Ref Range   Microalbumin Ur, POC 50 mg/L   Creatinine, POC  mg/dL   Albumin/Creatinine  Ratio, Urine, POC      PHQ2/9: Depression screen [facility] 2/9 09/14/2015 06/18/2015  Decreased Interest 0 0  Down, Depressed, Hopeless 0 0  PHQ - 2 Score 0 0     Fall Risk: Fall Risk  09/14/2015 06/18/2015  Falls in the past year? No Yes  Number falls in past yr: - 1  Injury with Fall? - No     Functional Status Survey: Is the patient deaf or have difficulty hearing?: No Does the patient have difficulty seeing, even when wearing glasses/contacts?: No Does the patient have difficulty concentrating, remembering, or making decisions?: No Does the patient have difficulty walking or climbing stairs?: No Does the patient have difficulty dressing or bathing?: No Does the patient have difficulty doing errands alone such as visiting a doctor's office or shopping?: No    Assessment & Plan  1. Diabetic polyneuropathy associated with type 2 diabetes mellitus (HCC)  - POCT HgB A1C - POCT UA - Microalbumin - pregabalin (LYRICA) 150 MG capsule; Take 1 capsule (150 mg total) by mouth 2 (two) times daily.  Dispense: 60 capsule; Refill: 5  2. Benign essential HTN  controlled  3. Dyslipidemia  Continue statin therapy   4. Polyneuropathy (HCC)  - pregabalin (LYRICA) 150 MG capsule; Take 1 capsule (150 mg total) by mouth 2 (two) times daily.  Dispense: 60 capsule; Refill: 5  5. OSA (obstructive sleep apnea)  Discussed importance of using CPAP machine  6. Need for shingles vaccine  - Varicella-zoster vaccine subcutaneous -refused  7. Need for pneumococcal vaccination  - Pneumococcal  conjugate vaccine 13-valent IM  8. Need for hepatitis C screening test  - Hepatitis C antibody  9. Restless leg  - pregabalin (LYRICA) 150 MG capsule; Take 1 capsule (150 mg total) by mouth 2 (two) times daily.  Dispense: 60 capsule; Refill: 5

## 2015-09-29 ENCOUNTER — Telehealth: Payer: Self-pay

## 2015-09-29 NOTE — Telephone Encounter (Signed)
Patient called stating that he has a bad cough that is somewhat productive and wanted to know if something could be called in to [facility] in [place].

## 2015-09-29 NOTE — Telephone Encounter (Signed)
He needs to be seen Can take Mucinex DM otc if needed.

## 2015-09-30 NOTE — Telephone Encounter (Signed)
Please schedule patient for an appt.

## 2015-09-30 NOTE — Telephone Encounter (Signed)
LVM to schedule an appt

## 2015-10-03 ENCOUNTER — Encounter: Payer: Self-pay | Admitting: Emergency Medicine

## 2015-10-03 ENCOUNTER — Ambulatory Visit (INDEPENDENT_AMBULATORY_CARE_PROVIDER_SITE_OTHER): Payer: PPO

## 2015-10-03 ENCOUNTER — Ambulatory Visit
Admission: EM | Admit: 2015-10-03 | Discharge: 2015-10-03 | Disposition: A | Discharge status: Home / Self Care | Payer: PPO | Attending: Family Medicine | Admitting: Family Medicine

## 2015-10-03 DIAGNOSIS — J40 Bronchitis, not specified as acute or chronic: Secondary | ICD-10-CM

## 2015-10-03 DIAGNOSIS — R05 Cough: Secondary | ICD-10-CM | POA: Diagnosis not present

## 2015-10-03 MED ORDER — BENZONATATE 100 MG PO CAPS: 100.0000 mg | ORAL_CAPSULE | Freq: Three times a day (TID) | ORAL | Status: DC | PRN

## 2015-10-03 MED ORDER — PREDNISONE 10 MG PO TABS: ORAL_TABLET | ORAL | Status: DC

## 2015-10-03 MED ORDER — ALBUTEROL SULFATE (2.5 MG/3ML) 0.083% IN NEBU
2.5000 mg | INHALATION_SOLUTION | Freq: Once | RESPIRATORY_TRACT | Status: AC
  Administered 2015-10-03: 2.5 mg via RESPIRATORY_TRACT

## 2015-10-03 MED ORDER — LEVOFLOXACIN 750 MG PO TABS: 750.0000 mg | ORAL_TABLET | Freq: Every day | ORAL | Status: DC

## 2015-10-03 MED ORDER — IPRATROPIUM-ALBUTEROL 0.5-2.5 (3) MG/3ML IN SOLN
3.0000 mL | Freq: Once | RESPIRATORY_TRACT | Status: AC
  Administered 2015-10-03: 3 mL via RESPIRATORY_TRACT

## 2015-10-03 MED ORDER — ALBUTEROL SULFATE HFA 108 (90 BASE) MCG/ACT IN AERS: 2.0000 | INHALATION_SPRAY | RESPIRATORY_TRACT | Status: DC | PRN

## 2015-10-03 MED ORDER — ALBUTEROL SULFATE (2.5 MG/3ML) 0.083% IN NEBU: 2.5000 mg | INHALATION_SOLUTION | Freq: Once | RESPIRATORY_TRACT | Status: DC

## 2015-10-03 NOTE — ED Notes (Signed)
Patient c/o cough and chest congestion for over a week.  Patient denies fevers.  

## 2015-10-03 NOTE — ED Provider Notes (Signed)
[facility]  ____________________________________________  Time seen: Approximately 12:44 PM  I have reviewed the triage vital signs and the nursing notes.   HISTORY  Chief Complaint Cough  HPI LASEAN Ian Lucas is a 65 y.o. male presents with a complaint of cough and chest congestion that has been present for 1.5 weeks. Patient reports initially had some nasal congestion but denies any nasal congestion presently. Patient states that he feels like he has congestion in his chest. Patient states that he is frequently coughing. Patient does report that he intermittently hears himself wheeze when coughing. States that the cough is occasionally productive of greenish mucus. Patient denies fevers. Denies seasonal allergies. Denies known triggers.Denies known sick contacts.   Reports continues to eat and drink well. Patient reports that the cough often does wake him up at night. Denies any recent sickness or antibiotic use. Patient reports that he usually hears himself wheeze when he is coughing, and not at rest. Patient reports he occasionally has a mild shortness of breath when he is coughing and working at home but otherwise denies shortness of breath. Denies any shortness of breath at rest or with normal activities. Denies any shortness of breath in absence of cough. Denies chest pain or chest pain with deep breath. Reports has not been taking any medications for the same.  Denies current shortness of breath, chest pain, chest pain with deep breath, abdominal pain, dysuria, extremity pain, extremity swelling, recent surgery, or recent immobilization, recent trips. Denies fevers. Reports continues to eat and drink well. Denies any recent hospitalizations.  PCP: [facility] and at the [place].   Past Medical History  Diagnosis Date  . Hypertension   . Diabetes mellitus without complication (HCC)   . Hypercholesteremia     Patient Active Problem List   Diagnosis Date Noted  . Elevated  prostate specific antigen (PSA) 06/18/2015  . Benign essential HTN 06/18/2015  . Chronic LBP 06/18/2015  . Diabetic neuropathy (HCC) 06/18/2015  . Posttraumatic stress disorder 06/18/2015  . Dyslipidemia 06/18/2015  . Lichen simplex 06/18/2015  . History of herpes zoster 06/18/2015  . Restless leg 06/18/2015  . OSA (obstructive sleep apnea) 06/18/2015  . Polyneuropathy (HCC) 06/18/2015  . 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 Calculus of kidney 08/21/2013  . History of pancreatitis 08/15/2013  . Hydrocele 05/19/2013  . Arthritis of knee, degenerative 10/30/2012  . Lumbar canal stenosis 10/30/2012  . Chronic prostatitis 06/19/2012  . Hypogonadism in male 06/19/2012  . 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 Benign prostatic hyperplasia with urinary obstruction 06/19/2012    Past Surgical History  Procedure Laterality Date  . Back surgery    . Hernia repair    . Hydrocele excision / repair Left   . Circumcision N/A     Current Outpatient Rx  Name  Route  Sig  Dispense  Refill  . amLODipine-atorvastatin (CADUET) 5-10 MG per tablet   Oral   Take 1 tablet by mouth daily.         Marland Kitchen aspirin 81 MG tablet   Oral   Take 81 mg by mouth daily.         Marland Kitchen atorvastatin (LIPITOR) 40 MG tablet   Oral   Take 40 mg by mouth daily.         . carvedilol (COREG) 25 MG tablet   Oral   Take 25 mg by mouth 2 (two) times daily with a meal.         .  co-enzyme Q-10 30 MG capsule   Oral   Take 100 mg by mouth daily.         . DULoxetine (CYMBALTA) 20 MG capsule   Oral   Take 20 mg by mouth daily.         . insulin glargine (LANTUS) 100 UNIT/ML injection   Subcutaneous   Inject 37 Units into the skin daily.         Marland Kitchen losartan (COZAAR) 100 MG tablet   Oral   Take 100 mg by mouth daily.         . nateglinide (STARLIX) 60 MG tablet   Oral   Take 1 tablet (60 mg total) by mouth 3 (three) times daily with meals.   90 tablet   2    . prazosin (MINIPRESS) 2 MG capsule   Oral   Take 2 mg by mouth at bedtime.         . pregabalin (LYRICA) 150 MG capsule   Oral   Take 1 capsule (150 mg total) by mouth 2 (two) times daily.   60 capsule   5   . sildenafil (REVATIO) 20 MG tablet   Oral   Take 20 mg by mouth as needed.         . tamsulosin (FLOMAX) 0.4 MG CAPS capsule   Oral   Take 0.4 mg by mouth daily.         Marland Kitchen testosterone cypionate (DEPOTESTOTERONE CYPIONATE) 100 MG/ML injection   Intramuscular   Inject 200 mg into the muscle every 28 (twenty-eight) days. For IM use only           Allergies Banana; Codeine; Iodine; Ace inhibitors; Gabapentin; and Metformin  Family History  Problem Relation Age of Onset  . Stroke Mother   . Diabetes Father     Social History Social History  Substance Use Topics  . Smoking status: Former Smoker -- 0.25 packs/day for 30 years    Types: Cigarettes    Start date: 05/08/1968    Quit date: 06/17/1998  . Smokeless tobacco: Never Used  . Alcohol Use: No    Review of Systems Constitutional: No fever/chills Eyes: No visual changes. ENT: No sore throat. Cardiovascular: Denies chest pain. Respiratory: Denies shortness of breath. Gastrointestinal: No abdominal pain.  No nausea, no vomiting.  No diarrhea.  No constipation. Genitourinary: Negative for dysuria. Musculoskeletal: Negative for back pain. Skin: Negative for rash. Neurological: Negative for headaches, focal weakness or numbness.  10-point ROS otherwise negative.  ____________________________________________   PHYSICAL EXAM:  VITAL SIGNS: ED Triage Vitals  Enc Vitals Group     BP 10/03/15 1047 134/71 mmHg     Pulse Rate 10/03/15 1047 74     Resp 10/03/15 1047 16     Temp 10/03/15 1047 97 F (36.1 C)     Temp Source 10/03/15 1047 Tympanic     SpO2 10/03/15 1047 95 %     Weight 10/03/15 1047 226 lb (102.513 kg)     Height 10/03/15 1047 5\' 7"  (1.702 m)     Head Cir --      Peak Flow --       Pain Score 10/03/15 1049 0     Pain Loc --      Pain Edu? --      Excl. in GC? --    Today's Vitals   10/03/15 1049 10/03/15 1152 10/03/15 1253 10/03/15 1253  BP:      Pulse:  72  72  Temp:      TempSrc:      Resp:  16  16  Height:      Weight:      SpO2:  96%  96%  PainSc: 0-No pain  0-No pain      Constitutional: Alert and oriented. Well appearing and in no acute distress. Eyes: Conjunctivae are normal. PERRL. EOMI. Head: Atraumatic.no sinus tenderness to palpation.   Ears: no erythema, normal TMs bilaterally.   Nose: No congestion/rhinnorhea.  Mouth/Throat: Mucous membranes are moist.  Oropharynx non-erythematous.No tonsillar swelling or exudate.  Neck: No stridor.  No cervical spine tenderness to palpation. Hematological/Lymphatic/Immunilogical: No cervical lymphadenopathy. Cardiovascular: Normal rate, regular rhythm. Grossly normal heart sounds.  Good peripheral circulation. Respiratory: Normal respiratory effort.  No retractions. Speaks in complete sentences. No respiratory distress. Mild scattered inspiratory wheezes throughout with bilateral base rhonchi. Ambulatory in room while speaking in complete sentences.  Gastrointestinal: Soft and nontender. Obese abdomen.  No CVA tenderness. Musculoskeletal: No lower or upper extremity tenderness nor edema.  No calf tenderness bilaterally. Bilateral pedal pulses equal and easily palpated.  Neurologic:  Normal speech and language. No gross focal neurologic deficits are appreciated. No gait instability. Skin:  Skin is warm, dry and intact. No rash noted. Psychiatric: Mood and affect are normal. Speech and behavior are normal.  ____________________________________________   LABS (all labs ordered are listed, but only abnormal results are displayed)  Labs Reviewed - No data to display  RADIOLOGY  Dg Chest 2 View  10/03/2015  CLINICAL DATA:  Cough and wheezing for 1 week.  Ex-smoker. EXAM: CHEST  2 VIEW COMPARISON:   07/04/2015. FINDINGS: Lateral view degraded by patient arm position. Moderate thoracic spondylosis. Midline trachea. Mild cardiomegaly. Mediastinal contours otherwise within normal limits. No pleural effusion or pneumothorax. Mild left hemidiaphragm elevation. Clear right lung. Left base volume loss with infrahilar and lower lobe airspace opacity. IMPRESSION: Persistent left hemidiaphragm elevation with volume loss, medial lower lobe and infrahilar airspace disease. Although this could represent atelectasis, mild infection cannot be excluded. Consider radiographic follow-up in 3-4 days if symptoms persist. Electronically Signed   By: Jeronimo Greaves M.D.   On: 10/03/2015 11:55     INITIAL IMPRESSION / ASSESSMENT AND PLAN / ED COURSE  Pertinent labs & imaging results that were available during my care of the patient were reviewed by me and considered in my medical decision making (see chart for details).  Overall well-appearing patient. Presents for the complaints of 1.5 weeks of chest congestion and cough. Patient states the cough is occasionally productive. Patient reports intermittently hearing himself wheeze with cough. Patient denies current chest pain or shortness of breath. Speaks in complete sentences. No respiratory distress. Mild scattered inspiratory wheezes throughout with bilateral base rhonchi. Will evaluate chest x-ray. Will administer 2 albuterol nebulization treatments. Discussed evaluation a labs with patient, patient declines labs at this time, will defer labs.  After albuterol nebulizer treatments, patient reports that he does feel an improvement in his chest congestion. Lungs reexamined and wheezes resolved, and good air movement. Patient denies shortness of breath or chest pain at this time. Chest x-ray reviewed, discussed chest x-ray and patient with Dr. Judd Gaudier who agrees with plan and treatment. Chest x-ray per radiologist persistent left hemidiaphragm elevation with volume loss,  medial lower lobe and infrahilar airspace disease, although this could represent atelectasis mild infection cannot be excluded and consider radiographic follow-up in 3-4 days if symptoms persist, see full radiology report.  Discussed in detail with patient and  his wife based on clinical appearance and subjective report concern for early left lower pneumonia and bronchitis. Discussed detail with patient and wife will treat with oral prednisone taper, Levaquin, and when necessary albuterol inhaler, when necessary Tessalon Perles. Patient reports he is a diabetic and monitoring his sugar at home with normal reading between 90 to 130. Discussed monitoring glucose at home closely as medications may increase blood sugar. Patient verbalized understanding. Patient directed to follow-up very close with his primary care physician and directed to follow-up in the next 2-3 days. Discussed in detail with patient and his wife to seek immediate follow-up for any worsening concerns, fever, chest pain, shortness of breath, chest pain with deep breath, weakness or worsening concerns. Patient and spouse verbalized understanding and agreed to this plan and states that they will follow-up with his primary care physician in the next 2-3 days. Printed copy of chest xray sent with patient.   Discussed follow up with Primary care physician this week. Discussed follow up and return parameters including no resolution or any worsening concerns. Patient verbalized understanding and agreed to plan.   ____________________________________________   FINAL CLINICAL IMPRESSION(S) / ED DIAGNOSES  Final diagnoses:  Bronchitis     Discharge Medication List as of 10/03/2015 12:49 PM    START taking these medications   Details  albuterol (PROVENTIL HFA;VENTOLIN HFA) 108 (90 Base) MCG/ACT inhaler Inhale 2 puffs into the lungs every 4 (four) hours as needed for wheezing or shortness of breath., Starting 10/03/2015, Until Discontinued,  Normal    benzonatate (TESSALON PERLES) 100 MG capsule Take 1 capsule (100 mg total) by mouth 3 (three) times daily as needed for cough., Starting 10/03/2015, Until Discontinued, Normal    levofloxacin (LEVAQUIN) 750 MG tablet Take 1 tablet (750 mg total) by mouth daily., Starting 10/03/2015, Until Discontinued, Normal    predniSONE (DELTASONE) 10 MG tablet Start 60 mg po day one, then 50 mg po day two, taper by 10 mg daily until complete., Normal        Note: This dictation was prepared with Dragon dictation along with smaller phrase technology. Any transcriptional errors that result from this process are unintentional.       Renford Dills, NP 10/03/15 1520  Renford Dills, NP 10/03/15 1520

## 2015-10-03 NOTE — Discharge Instructions (Signed)
Take medication as prescribed. Rest.   Follow up with your primary care physician this week  In 2-3 days as discussed. Close follow up is important.   Return to Urgent care or ER for chest pain, fevers, shortness of breath, new or worsening concerns.

## 2015-10-06 ENCOUNTER — Ambulatory Visit (INDEPENDENT_AMBULATORY_CARE_PROVIDER_SITE_OTHER): Payer: PPO | Admitting: Family Medicine

## 2015-10-06 ENCOUNTER — Encounter: Payer: Self-pay | Admitting: Family Medicine

## 2015-10-06 VITALS — BP 132/76 | HR 80 | Temp 98.0°F | Resp 18 | Wt 226.2 lb

## 2015-10-06 DIAGNOSIS — J189 Pneumonia, unspecified organism: Secondary | ICD-10-CM

## 2015-10-06 DIAGNOSIS — R062 Wheezing: Secondary | ICD-10-CM

## 2015-10-06 MED ORDER — HYDROCOD POLST-CPM POLST ER 10-8 MG/5ML PO SUER: 5.0000 mL | Freq: Two times a day (BID) | ORAL | Status: DC | PRN

## 2015-10-06 NOTE — Progress Notes (Signed)
Name: Ian Lucas   MRN: [id number]    DOB: 10/06/1950   Date:10/06/2015       Progress Note  Subjective  Chief Complaint  Chief Complaint  Patient presents with  . Pneumonia    patient is here for a f/u visit with the urgent care. patient stated that he has been taking all medication as directed but has not improved.   . Cough  . Shortness of Breath  . Wheezing    HPI  CAP: he went to Urgent care this past weekend because wife told him to. Reviewed notes from Urgent Care and he said symptoms started the week prior , however today he told me symptoms have been going on for months but he never told any provider about it. He states he has a dry cough that comes in spells, during the day or night, also wheezes but worse at night, he also has SOB with activity, no leg swelling, he denies orthopnea. No fever or chills. Appetite is normal. He has been taking Levaquin and prednisone taper and wheezing has improved but continues to cough, he would like to have a cough suppressant.   Patient Active Problem List   Diagnosis Date Noted  . Elevated prostate specific antigen (PSA) 06/18/2015  . Benign essential HTN 06/18/2015  . Chronic LBP 06/18/2015  . Diabetic neuropathy (HCC) 06/18/2015  . Posttraumatic stress disorder 06/18/2015  . Dyslipidemia 06/18/2015  . Lichen simplex 06/18/2015  . History of herpes zoster 06/18/2015  . Restless leg 06/18/2015  . OSA (obstructive sleep apnea) 06/18/2015  . Polyneuropathy (HCC) 06/18/2015  . 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 Calculus of kidney 08/21/2013  . History of pancreatitis 08/15/2013  . Hydrocele 05/19/2013  . Arthritis of knee, degenerative 10/30/2012  . Lumbar canal stenosis 10/30/2012  . Chronic prostatitis 06/19/2012  . Hypogonadism in male 06/19/2012  . 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 Benign prostatic hyperplasia with urinary obstruction 06/19/2012    Past Surgical History   Procedure Laterality Date  . Back surgery    . Hernia repair    . Hydrocele excision / repair Left   . Circumcision N/A     Family History  Problem Relation Age of Onset  . Stroke Mother   . Diabetes Father     Social History   Social History  . Marital Status: Married    Spouse Name: N/A  . Number of Children: N/A  . Years of Education: N/A   Occupational History  . Not on file.   Social History Main Topics  . Smoking status: Former Smoker -- 0.25 packs/day for 30 years    Types: Cigarettes    Start date: 05/08/1968    Quit date: 06/17/1998  . Smokeless tobacco: Never Used  . Alcohol Use: No  . Drug Use: No  . Sexual Activity:    Partners: Female   Other Topics Concern  . Not on file   Social History Narrative     Current outpatient prescriptions:  .  albuterol (PROVENTIL HFA;VENTOLIN HFA) 108 (90 Base) MCG/ACT inhaler, Inhale 2 puffs into the lungs every 4 (four) hours as needed for wheezing or shortness of breath., Disp: 1 Inhaler, Rfl: 0 .  amLODipine-atorvastatin (CADUET) 5-10 MG per tablet, Take 1 tablet by mouth daily., Disp: , Rfl:  .  aspirin 81 MG tablet, Take 81 mg by mouth daily., Disp: , Rfl:  .  atorvastatin (LIPITOR) 40 MG tablet, Take 40 mg by mouth daily.,  Disp: , Rfl:  .  benzonatate (TESSALON PERLES) 100 MG capsule, Take 1 capsule (100 mg total) by mouth 3 (three) times daily as needed for cough., Disp: 15 capsule, Rfl: 0 .  carvedilol (COREG) 25 MG tablet, Take 25 mg by mouth 2 (two) times daily with a meal., Disp: , Rfl:  .  co-enzyme Q-10 30 MG capsule, Take 100 mg by mouth daily., Disp: , Rfl:  .  DULoxetine (CYMBALTA) 20 MG capsule, Take 20 mg by mouth daily., Disp: , Rfl:  .  insulin glargine (LANTUS) 100 UNIT/ML injection, Inject 37 Units into the skin daily., Disp: , Rfl:  .  levofloxacin (LEVAQUIN) 750 MG tablet, Take 1 tablet (750 mg total) by mouth daily., Disp: 5 tablet, Rfl: 0 .  losartan (COZAAR) 100 MG tablet, Take 100 mg by  mouth daily., Disp: , Rfl:  .  nateglinide (STARLIX) 60 MG tablet, Take 1 tablet (60 mg total) by mouth 3 (three) times daily with meals., Disp: 90 tablet, Rfl: 2 .  prazosin (MINIPRESS) 2 MG capsule, Take 2 mg by mouth at bedtime., Disp: , Rfl:  .  predniSONE (DELTASONE) 10 MG tablet, Start 60 mg po day one, then 50 mg po day two, taper by 10 mg daily until complete., Disp: 21 tablet, Rfl: 0 .  pregabalin (LYRICA) 150 MG capsule, Take 1 capsule (150 mg total) by mouth 2 (two) times daily., Disp: 60 capsule, Rfl: 5 .  sildenafil (REVATIO) 20 MG tablet, Take 20 mg by mouth as needed., Disp: , Rfl:  .  tamsulosin (FLOMAX) 0.4 MG CAPS capsule, Take 0.4 mg by mouth daily., Disp: , Rfl:  .  testosterone cypionate (DEPOTESTOTERONE CYPIONATE) 100 MG/ML injection, Inject 200 mg into the muscle every 28 (twenty-eight) days. For IM use only, Disp: , Rfl:   Allergies  Allergen Reactions  . Banana Shortness Of Breath  . Codeine Itching  . Iodine Swelling  . Ace Inhibitors Cough  . Gabapentin     Anxious and jumpy   . Metformin     diarrhea     ROS  Ten systems reviewed and is negative except as mentioned in HPI   Objective  Filed Vitals:   10/06/15 1052  BP: 132/76  Pulse: 80  Temp: 98 F (36.7 C)  TempSrc: Oral  Resp: 18  Weight: 226 lb 3.2 oz (102.604 kg)  SpO2: 95%    Body mass index is 35.42 kg/(m^2).  Physical Exam  Constitutional: Patient appears well-developed and well-nourished. Obese  No distress.  HEENT: head atraumatic, normocephalic, pupils equal and reactive to light, neck supple, throat within normal limits Cardiovascular: Normal rate, regular rhythm and normal heart sounds.  No murmur heard. No BLE edema. Pulmonary/Chest: Effort normal and breath sounds normal. No respiratory distress. Abdominal: Soft.  There is no tenderness. Psychiatric: Patient has a normal mood and affect. behavior is normal. Judgment and thought content normal.  Recent Results (from the past  2160 hour(s))  POCT HgB A1C     Status: Abnormal   Collection Time: 09/14/15  9:41 AM  Result Value Ref Range   Hemoglobin A1C 6.8   POCT UA - Microalbumin     Status: Abnormal   Collection Time: 09/14/15  9:42 AM  Result Value Ref Range   Microalbumin Ur, POC 50 mg/L   Creatinine, POC  mg/dL   Albumin/Creatinine Ratio, Urine, POC       PHQ2/9: Depression screen [facility] 2/9 10/06/2015 09/14/2015 06/18/2015  Decreased Interest 0 0 0  Down, Depressed, Hopeless 0 0 0  PHQ - 2 Score 0 0 0     Fall Risk: Fall Risk  10/06/2015 09/14/2015 06/18/2015  Falls in the past year? No No Yes  Number falls in past yr: - - 1  Injury with Fall? - - No      Functional Status Survey: Is the patient deaf or have difficulty hearing?: No Does the patient have difficulty seeing, even when wearing glasses/contacts?: No Does the patient have difficulty concentrating, remembering, or making decisions?: No Does the patient have difficulty walking or climbing stairs?: No Does the patient have difficulty dressing or bathing?: No Does the patient have difficulty doing errands alone such as visiting a doctor's office or shopping?: No    Assessment & Plan  1. CAP (community acquired pneumonia)  We will treat and if cough persists afterwards refer to pulmonologist since symptoms have been going on for a long time and has a history of tobacco use, however quit 17 years ago - DG Chest 2 View; Future in 4 weeks, continue medication  - chlorpheniramine-HYDROcodone (TUSSIONEX PENNKINETIC ER) 10-8 MG/5ML SUER; Take 5 mLs by mouth every 12 (twelve) hours as needed.  Dispense: 140 mL; Refill: 0  2. Wheezing  Continue inhaler

## 2015-11-15 ENCOUNTER — Ambulatory Visit: Payer: PPO | Admitting: Family Medicine

## 2015-11-16 ENCOUNTER — Telehealth: Payer: Self-pay

## 2015-11-16 ENCOUNTER — Encounter: Payer: Self-pay | Admitting: *Deleted

## 2015-11-16 ENCOUNTER — Ambulatory Visit
Admission: EM | Admit: 2015-11-16 | Discharge: 2015-11-16 | Disposition: A | Discharge status: Home / Self Care | Payer: PPO | Attending: Internal Medicine | Admitting: Internal Medicine

## 2015-11-16 DIAGNOSIS — S86811A Strain of other muscle(s) and tendon(s) at lower leg level, right leg, initial encounter: Secondary | ICD-10-CM | POA: Diagnosis not present

## 2015-11-16 MED ORDER — NAPROXEN 375 MG PO TABS: 375.0000 mg | ORAL_TABLET | Freq: Two times a day (BID) | ORAL | Status: DC

## 2015-11-16 NOTE — ED Notes (Signed)
Pain to lower posterior right calf, awoke with pain during night last night. Mild pain at first then worsened throughout day. Lower posterior right leg and heel appear reddened and are tender to touch. Posterior right heel has deep red discoloration. Pt spoke with PCP- Dr Delene Ruffini to day and was instructed to nearest ED.

## 2015-11-16 NOTE — Discharge Instructions (Signed)
Ice 5-10 minutes for 2-4 times daily for the next couple days will help with swelling/discomfort. Prescription for naproxen (anti inflammatory) sent to the [facility] in [facility].   Recheck if not improving in the next several days.  Muscle Strain A muscle strain (pulled muscle) happens when a muscle is stretched beyond normal length. It happens when a sudden, violent force stretches your muscle too far. Usually, a few of the fibers in your muscle are torn. Muscle strain is common in athletes. Recovery usually takes 1-2 weeks. Complete healing takes 5-6 weeks.  HOME CARE   Follow the PRICE method of treatment to help your injury get better. Do this the first 2-3 days after the injury:  Protect. Protect the muscle to keep it from getting injured again.  Rest. Limit your activity and rest the injured body part.  Ice. Put ice in a plastic bag. Place a towel between your skin and the bag. Then, apply the ice and leave it on from 15-20 minutes each hour. After the third day, switch to moist heat packs.  Compression. Use a splint or elastic bandage on the injured area for comfort. Do not put it on too tightly.  Elevate. Keep the injured body part above the level of your heart.  Only take medicine as told by your doctor.  Warm up before doing exercise to prevent future muscle strains. GET HELP IF:   You have more pain or puffiness (swelling) in the injured area.  You feel numbness, tingling, or notice a loss of strength in the injured area. MAKE SURE YOU:   Understand these instructions.  Will watch your condition.  Will get help right away if you are not doing well or get worse.   This information is not intended to replace advice given to you by your health care provider. Make sure you discuss any questions you have with your health care provider.   Document Released: 02/01/2008 Document Revised: 02/12/2013 Document Reviewed: 11/21/2012 Elsevier Interactive Patient Education [employer].

## 2015-11-16 NOTE — ED Provider Notes (Signed)
CSN: [id number]     Arrival date & time 11/16/15  1603 History   First MD Initiated Contact with Patient 11/16/15 1651     Chief Complaint  Patient presents with  . Leg Pain   HPI  65 year old patient, woke up this morning with mild discomfort and focal small swelling of the right medial calf, with bruising below the ankle.  Does not recall an injury, but does work outside, walks a lot, sprays. Describes discomfort as fairly mild, is not limping. Worked all day today.  Reports wife is concerned about possible DVT.  Past Medical History  Diagnosis Date  . Hypertension   . Diabetes mellitus without complication (HCC)   . Hypercholesteremia    Past Surgical History  Procedure Laterality Date  . Back surgery    . Hernia repair    . Hydrocele excision / repair Left   . Circumcision N/A    Family History  Problem Relation Age of Onset  . Stroke Mother   . Diabetes Father    Social History  Substance Use Topics  . Smoking status: Former Smoker -- 0.25 packs/day for 30 years    Types: Cigarettes    Start date: 05/08/1968    Quit date: 06/17/1998  . Smokeless tobacco: Never Used  . Alcohol Use: No    Review of Systems  All other systems reviewed and are negative.   Allergies  Banana; Codeine; Iodine; Ace inhibitors; Gabapentin; and Metformin  Home Medications   Prior to Admission medications   Medication Sig Start Date End Date Taking? Authorizing Provider  amLODipine-atorvastatin (CADUET) 5-10 MG per tablet Take 1 tablet by mouth daily.   Yes Historical Provider, MD  aspirin 81 MG tablet Take 81 mg by mouth daily.   Yes Historical Provider, MD  atorvastatin (LIPITOR) 40 MG tablet Take 40 mg by mouth daily.   Yes Historical Provider, MD  carvedilol (COREG) 25 MG tablet Take 25 mg by mouth 2 (two) times daily with a meal.   Yes Historical Provider, MD  co-enzyme Q-10 30 MG capsule Take 100 mg by mouth daily.   Yes Historical Provider, MD  DULoxetine (CYMBALTA) 20 MG  capsule Take 20 mg by mouth daily.   Yes Historical Provider, MD  insulin glargine (LANTUS) 100 UNIT/ML injection Inject 37 Units into the skin daily.   Yes Historical Provider, MD  losartan (COZAAR) 100 MG tablet Take 100 mg by mouth daily.   Yes Historical Provider, MD  nateglinide (STARLIX) 60 MG tablet Take 1 tablet (60 mg total) by mouth 3 (three) times daily with meals. 06/18/15  Yes Alba Cory, MD  prazosin (MINIPRESS) 2 MG capsule Take 2 mg by mouth at bedtime.   Yes Historical Provider, MD  pregabalin (LYRICA) 150 MG capsule Take 1 capsule (150 mg total) by mouth 2 (two) times daily. 09/14/15  Yes Alba Cory, MD  tamsulosin (FLOMAX) 0.4 MG CAPS capsule Take 0.4 mg by mouth daily.   Yes Historical Provider, MD  testosterone cypionate (DEPOTESTOTERONE CYPIONATE) 100 MG/ML injection Inject 200 mg into the muscle every 28 (twenty-eight) days. For IM use only   Yes Historical Provider, MD  chlorpheniramine-HYDROcodone (TUSSIONEX PENNKINETIC ER) 10-8 MG/5ML SUER Take 5 mLs by mouth every 12 (twelve) hours as needed. 10/06/15   Alba Cory, MD  sildenafil (REVATIO) 20 MG tablet Take 20 mg by mouth as needed.    Historical Provider, MD      BP 154/81 mmHg  Pulse 71  Temp(Src) 98 F (36.7 C) (  Oral)  Resp 16  Ht 5\' 6"  (1.676 m)  Wt 220 lb (99.791 kg)  BMI 35.53 kg/m2  SpO2 97% Physical Exam  Constitutional: He is oriented to person, place, and time. No distress.  Alert, nicely groomed  HENT:  Head: Atraumatic.  Eyes:  Conjugate gaze, no eye redness/drainage  Neck: Neck supple.  Cardiovascular: Normal rate.   Pulmonary/Chest: No respiratory distress.  Abdominal: He exhibits no distension.  Musculoskeletal: Normal range of motion.       Legs:      Feet:  Ankle range of motion does not increase pain. Focal swelling, 1.5 inches, right medial calf as diagrammed. Mildly tender to palpation. Not reducible. Bruising below medial malleolus as diagrammed.  Neurological: He is  alert and oriented to person, place, and time.  Skin: Skin is warm and dry.  No cyanosis  Nursing note and vitals reviewed.   ED Course  Procedures (including critical care time)  none  MDM   1. Strain of calf muscle, right, initial encounter    Meds ordered this encounter  Medications  . naproxen (NAPROSYN) 375 MG tablet    Sig: Take 1 tablet (375 mg total) by mouth 2 (two) times daily.    Dispense:  20 tablet    Refill:  0   Ice 5-10 min 2-4x daily.  Recheck if discomfort not starting to improve in a few days, or for increasing swelling/pain.      , MD 11/17/15 1341

## 2015-11-16 NOTE — Telephone Encounter (Signed)
Patient called stating that his leg from the knee down to the ankle was swollen and warm to the touch. He stated he noticed it last night and that his ankle was blue. Patient was strongly encouraged to go to the nearest ER to rule out a DVT or possible complication with his circulation.   Patient was informed that Dr. Carlynn Purl would be notified but to go as soon as possible.

## 2015-11-29 ENCOUNTER — Ambulatory Visit
Admission: RE | Admit: 2015-11-29 | Discharge: 2015-11-29 | Disposition: A | Discharge status: Home / Self Care | Payer: PPO | Source: Ambulatory Visit | Attending: Family Medicine | Admitting: Family Medicine

## 2015-11-29 DIAGNOSIS — J189 Pneumonia, unspecified organism: Secondary | ICD-10-CM | POA: Insufficient documentation

## 2015-11-29 DIAGNOSIS — R05 Cough: Secondary | ICD-10-CM | POA: Diagnosis not present

## 2015-11-30 ENCOUNTER — Other Ambulatory Visit: Payer: Self-pay | Admitting: Family Medicine

## 2015-11-30 DIAGNOSIS — R9389 Abnormal findings on diagnostic imaging of other specified body structures: Secondary | ICD-10-CM

## 2015-12-01 ENCOUNTER — Other Ambulatory Visit: Payer: Self-pay | Admitting: Family Medicine

## 2015-12-01 ENCOUNTER — Telehealth: Payer: Self-pay

## 2015-12-01 DIAGNOSIS — R9389 Abnormal findings on diagnostic imaging of other specified body structures: Secondary | ICD-10-CM

## 2015-12-01 DIAGNOSIS — Z79899 Other long term (current) drug therapy: Secondary | ICD-10-CM

## 2015-12-01 DIAGNOSIS — E1142 Type 2 diabetes mellitus with diabetic polyneuropathy: Secondary | ICD-10-CM

## 2015-12-01 NOTE — Telephone Encounter (Signed)
Sent the order for comp panel and hgbA!C

## 2015-12-01 NOTE — Telephone Encounter (Signed)
When calling to schedule patient CT Scan the scheduling dept states the patient will need a more up to date BUN and Creatinine level checked. Due to the requirements are blood work has to be 58 weeks old or newer. The patient had blood work last checked in January 2017.

## 2015-12-02 NOTE — Telephone Encounter (Signed)
Patient notified

## 2015-12-06 ENCOUNTER — Ambulatory Visit (INDEPENDENT_AMBULATORY_CARE_PROVIDER_SITE_OTHER): Payer: PPO | Admitting: Family Medicine

## 2015-12-06 ENCOUNTER — Other Ambulatory Visit: Payer: Self-pay | Admitting: Family Medicine

## 2015-12-06 ENCOUNTER — Encounter: Payer: Self-pay | Admitting: Family Medicine

## 2015-12-06 VITALS — BP 124/70 | HR 81 | Temp 97.8°F | Resp 18 | Ht 66.0 in | Wt 226.2 lb

## 2015-12-06 DIAGNOSIS — J189 Pneumonia, unspecified organism: Secondary | ICD-10-CM

## 2015-12-06 DIAGNOSIS — M79652 Pain in left thigh: Secondary | ICD-10-CM

## 2015-12-06 DIAGNOSIS — M79604 Pain in right leg: Secondary | ICD-10-CM | POA: Diagnosis not present

## 2015-12-06 DIAGNOSIS — Z79899 Other long term (current) drug therapy: Secondary | ICD-10-CM | POA: Diagnosis not present

## 2015-12-06 DIAGNOSIS — R938 Abnormal findings on diagnostic imaging of other specified body structures: Secondary | ICD-10-CM | POA: Diagnosis not present

## 2015-12-06 DIAGNOSIS — R9389 Abnormal findings on diagnostic imaging of other specified body structures: Secondary | ICD-10-CM

## 2015-12-06 LAB — COMPLETE METABOLIC PANEL WITHOUT GFR
ALT: 18 U/L (ref 9–46)
AST: 16 U/L (ref 10–35)
Albumin: 4 g/dL (ref 3.6–5.1)
Alkaline Phosphatase: 61 U/L (ref 40–115)
BUN: 11 mg/dL (ref 7–25)
CO2: 29 mmol/L (ref 20–31)
Calcium: 9.2 mg/dL (ref 8.6–10.3)
Chloride: 100 mmol/L (ref 98–110)
Creat: 1.01 mg/dL (ref 0.70–1.25)
GFR, Est African American: 89 mL/min
GFR, Est Non African American: 77 mL/min
Glucose, Bld: 195 mg/dL — ABNORMAL HIGH (ref 65–99)
Potassium: 4 mmol/L (ref 3.5–5.3)
Sodium: 138 mmol/L (ref 135–146)
Total Bilirubin: 0.5 mg/dL (ref 0.2–1.2)
Total Protein: 7 g/dL (ref 6.1–8.1)

## 2015-12-06 MED ORDER — TIZANIDINE HCL 2 MG PO CAPS: 2.0000 mg | ORAL_CAPSULE | Freq: Three times a day (TID) | ORAL | 0 refills | Status: DC

## 2015-12-06 MED ORDER — NAPROXEN 375 MG PO TABS: 375.0000 mg | ORAL_TABLET | Freq: Two times a day (BID) | ORAL | 0 refills | Status: DC

## 2015-12-06 NOTE — Progress Notes (Signed)
Name: Ian Lucas   MRN: [id number]    DOB: [birth year]   Date:12/06/2015       Progress Note  Subjective  Chief Complaint  Chief Complaint  Patient presents with  . Pneumonia    community aquired pneumonia follow up    HPI  Abnormal CXR: he had CAP back in May but CXR still shows consolidation, discussed CT scan and he is willing to have it done.   Right leg pain: he went to [facility] about 10 days ago with right leg pain, he was worried about DVT, [place] was not done but he was given Naproxen for possible muscle strain. Also discussed phlebitis. Pain is back, also more active doing yard/businees and has noticed left thigh pain - described as aching, constant, worse when sitting and chronic back pain.    Patient Active Problem List   Diagnosis Date Noted  . Elevated prostate specific antigen (PSA) 06/18/2015  . Benign essential HTN 06/18/2015  . Chronic LBP 06/18/2015  . Diabetic neuropathy (HCC) 06/18/2015  . Posttraumatic stress disorder 06/18/2015  . Dyslipidemia 06/18/2015  . Lichen simplex 06/18/2015  . History of herpes zoster 06/18/2015  . Restless leg 06/18/2015  . OSA (obstructive sleep apnea) 06/18/2015  . Polyneuropathy (HCC) 06/18/2015  . 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 Calculus of kidney 08/21/2013  . History of pancreatitis 08/15/2013  . Hydrocele 05/19/2013  . Arthritis of knee, degenerative 10/30/2012  . Lumbar canal stenosis 10/30/2012  . Chronic prostatitis 06/19/2012  . Hypogonadism in male 06/19/2012  . 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 Benign prostatic hyperplasia with urinary obstruction 06/19/2012    Past Surgical History:  Procedure Laterality Date  . BACK SURGERY    . CIRCUMCISION N/A   . HERNIA REPAIR    . HYDROCELE EXCISION / REPAIR Left     Family History  Problem Relation Age of Onset  . Stroke Mother   . Diabetes Father     Social History   Social History  . Marital status: Married     Spouse name: N/A  . Number of children: N/A  . Years of education: N/A   Occupational History  . Not on file.   Social History Main Topics  . Smoking status: Former Smoker    Packs/day: 0.25    Years: 30.00    Types: Cigarettes    Start date: 05/08/1968    Quit date: 06/17/1998  . Smokeless tobacco: Never Used  . Alcohol use No  . Drug use: No  . Sexual activity: Yes    Partners: Female   Other Topics Concern  . Not on file   Social History Narrative  . No narrative on file     Current Outpatient Prescriptions:  .  albuterol (PROVENTIL HFA;VENTOLIN HFA) 108 (90 Base) MCG/ACT inhaler, Inhale 2 puffs into the lungs every 4 (four) hours as needed for wheezing or shortness of breath., Disp: 1 Inhaler, Rfl: 0 .  amLODipine-atorvastatin (CADUET) 5-10 MG per tablet, Take 1 tablet by mouth daily., Disp: , Rfl:  .  aspirin 81 MG tablet, Take 81 mg by mouth daily., Disp: , Rfl:  .  atorvastatin (LIPITOR) 40 MG tablet, Take 40 mg by mouth daily., Disp: , Rfl:  .  carvedilol (COREG) 25 MG tablet, Take 25 mg by mouth 2 (two) times daily with a meal., Disp: , Rfl:  .  co-enzyme Q-10 30 MG capsule, Take 100 mg by mouth daily., Disp: , Rfl:  .  DULoxetine (CYMBALTA) 20 MG capsule, Take 20 mg by mouth daily., Disp: , Rfl:  .  insulin glargine (LANTUS) 100 UNIT/ML injection, Inject 37 Units into the skin daily., Disp: , Rfl:  .  losartan (COZAAR) 100 MG tablet, Take 100 mg by mouth daily., Disp: , Rfl:  .  naproxen (NAPROSYN) 375 MG tablet, Take 1 tablet (375 mg total) by mouth 2 (two) times daily., Disp: 20 tablet, Rfl: 0 .  nateglinide (STARLIX) 60 MG tablet, Take 1 tablet (60 mg total) by mouth 3 (three) times daily with meals., Disp: 90 tablet, Rfl: 2 .  prazosin (MINIPRESS) 2 MG capsule, Take 2 mg by mouth at bedtime., Disp: , Rfl:  .  pregabalin (LYRICA) 150 MG capsule, Take 1 capsule (150 mg total) by mouth 2 (two) times daily., Disp: 60 capsule, Rfl: 5 .  sildenafil (REVATIO) 20 MG  tablet, Take 20 mg by mouth as needed., Disp: , Rfl:  .  tamsulosin (FLOMAX) 0.4 MG CAPS capsule, Take 0.4 mg by mouth daily., Disp: , Rfl:  .  testosterone cypionate (DEPOTESTOTERONE CYPIONATE) 100 MG/ML injection, Inject 200 mg into the muscle every 28 (twenty-eight) days. For IM use only, Disp: , Rfl:  .  tizanidine (ZANAFLEX) 2 MG capsule, Take 1 capsule (2 mg total) by mouth 3 (three) times daily., Disp: 30 capsule, Rfl: 0  Allergies  Allergen Reactions  . Banana Shortness Of Breath  . Codeine Itching  . Iodine Swelling  . Ace Inhibitors Cough  . Gabapentin     Anxious and jumpy   . Metformin     diarrhea     ROS  Ten systems reviewed and is negative except as mentioned in HPI Cough resolved, but still some wheezing   Objective  Vitals:   12/06/15 1439  BP: 124/70  Pulse: 81  Resp: 18  Temp: 97.8 F (36.6 C)  SpO2: 95%  Weight: 226 lb 3 oz (102.6 kg)  Height: 5\' 6"  (1.676 m)    Body mass index is 36.51 kg/m.  Physical Exam  Constitutional: Patient appears well-developed and well-nourished. Obese  No distress.  HEENT: head atraumatic, normocephalic, pupils equal and reactive to light,  neck supple, throat within normal limits Cardiovascular: Normal rate, regular rhythm and normal heart sounds.  No murmur heard. Trace  BLE edema. Pulmonary/Chest: Effort normal and breath sounds normal. No respiratory distress. Abdominal: Soft.  There is no tenderness. Psychiatric: Patient has a normal mood and affect. behavior is normal. Judgment and thought content normal. Muscular skeletal: pain during palpation of right anterior tibia, possible phlebitis. Left thigh pain, negative straight leg raise, pain with rom of lumbar spine  Recent Results (from the past 2160 hour(s))  POCT HgB A1C     Status: Abnormal   Collection Time: 09/14/15  9:41 AM  Result Value Ref Range   Hemoglobin A1C 6.8   POCT UA - Microalbumin     Status: Abnormal   Collection Time: 09/14/15  9:42 AM   Result Value Ref Range   Microalbumin Ur, POC 50 mg/L   Creatinine, POC  mg/dL   Albumin/Creatinine Ratio, Urine, POC        PHQ2/9: Depression screen [facility] 2/9 12/06/2015 10/06/2015 09/14/2015 06/18/2015  Decreased Interest 0 0 0 0  Down, Depressed, Hopeless 0 0 0 0  PHQ - 2 Score 0 0 0 0    Fall Risk: Fall Risk  12/06/2015 10/06/2015 09/14/2015 06/18/2015  Falls in the past year? No No No Yes  Number falls in  past yr: - - - 1  Injury with Fall? - - - No    Functional Status Survey: Is the patient deaf or have difficulty hearing?: No Does the patient have difficulty seeing, even when wearing glasses/contacts?: Yes Does the patient have difficulty concentrating, remembering, or making decisions?: No Does the patient have difficulty walking or climbing stairs?: No Does the patient have difficulty dressing or bathing?: No Does the patient have difficulty doing errands alone such as visiting a doctor's office or shopping?: No    Assessment & Plan  1. CAP (community acquired pneumonia)  CT scan already scheduled  2. Abnormal CXR  Check CT  3. Right leg pain  - naproxen (NAPROSYN) 375 MG tablet; Take 1 tablet (375 mg total) by mouth 2 (two) times daily.  Dispense: 20 tablet; Refill: 0  4. Left thigh pain  - tizanidine (ZANAFLEX) 2 MG capsule; Take 1 capsule (2 mg total) by mouth 3 (three) times daily.  Dispense: 30 capsule; Refill: 0

## 2015-12-09 ENCOUNTER — Telehealth: Payer: Self-pay | Admitting: Family Medicine

## 2015-12-09 NOTE — Telephone Encounter (Signed)
Please order the medication as recommended by tech. You may want to contact radiology department

## 2015-12-10 ENCOUNTER — Other Ambulatory Visit: Payer: Self-pay | Admitting: Family Medicine

## 2015-12-10 MED ORDER — PREDNISONE 20 MG PO TABS: 50.0000 mg | ORAL_TABLET | Freq: Every day | ORAL | 0 refills | Status: DC

## 2015-12-10 NOTE — Telephone Encounter (Signed)
Spoke with Judeth Cornfield in radiology and the protocol medication when someone is allergic to the dye is Benadryl 50 mg 1 hour prior to scan, and Prednisone 50 mg PO 13 hours, 7 hours, and then another dose 1 hour prior to scan. Patient will also have to have CT done to [facility] due to lips swelling and nausea symptoms from dye reaction in the past. Please send in medication to pharmacy and I will notify patient. Patient scan is on 12/14/15 at [facility].

## 2015-12-10 NOTE — Telephone Encounter (Signed)
Sent prednisone. Tell him to get Benadryl otc

## 2015-12-10 NOTE — Telephone Encounter (Signed)
Patient notified by phone.

## 2015-12-13 NOTE — Telephone Encounter (Signed)
Patient picked up Prednisone at [facility] at 10:30 a.m. Today and verified with this patient.

## 2015-12-14 ENCOUNTER — Ambulatory Visit: Admission: RE | Admit: 2015-12-14 | Payer: PPO | Source: Ambulatory Visit

## 2015-12-14 ENCOUNTER — Ambulatory Visit
Admission: RE | Admit: 2015-12-14 | Discharge: 2015-12-14 | Disposition: A | Discharge status: Home / Self Care | Payer: PPO | Source: Ambulatory Visit | Attending: Family Medicine | Admitting: Family Medicine

## 2015-12-14 DIAGNOSIS — R9389 Abnormal findings on diagnostic imaging of other specified body structures: Secondary | ICD-10-CM

## 2015-12-14 DIAGNOSIS — R938 Abnormal findings on diagnostic imaging of other specified body structures: Secondary | ICD-10-CM | POA: Diagnosis not present

## 2015-12-14 DIAGNOSIS — J189 Pneumonia, unspecified organism: Secondary | ICD-10-CM | POA: Diagnosis not present

## 2015-12-14 MED ORDER — IOPAMIDOL (ISOVUE-300) INJECTION 61%
75.0000 mL | Freq: Once | INTRAVENOUS | Status: AC | PRN
  Administered 2015-12-14: 75 mL via INTRAVENOUS

## 2015-12-15 ENCOUNTER — Ambulatory Visit: Payer: PPO

## 2016-01-03 ENCOUNTER — Ambulatory Visit: Payer: PPO | Admitting: Family Medicine

## 2016-01-13 ENCOUNTER — Other Ambulatory Visit: Payer: Self-pay | Admitting: Family Medicine

## 2016-01-13 DIAGNOSIS — M79604 Pain in right leg: Secondary | ICD-10-CM

## 2016-01-17 DIAGNOSIS — T1502XA Foreign body in cornea, left eye, initial encounter: Secondary | ICD-10-CM | POA: Diagnosis not present

## 2016-02-01 DIAGNOSIS — R1032 Left lower quadrant pain: Secondary | ICD-10-CM | POA: Diagnosis not present

## 2016-02-01 DIAGNOSIS — Z6833 Body mass index (BMI) 33.0-33.9, adult: Secondary | ICD-10-CM | POA: Diagnosis not present

## 2016-02-01 DIAGNOSIS — R972 Elevated prostate specific antigen [PSA]: Secondary | ICD-10-CM | POA: Diagnosis not present

## 2016-02-01 DIAGNOSIS — E291 Testicular hypofunction: Secondary | ICD-10-CM | POA: Diagnosis not present

## 2016-02-01 DIAGNOSIS — I878 Other specified disorders of veins: Secondary | ICD-10-CM | POA: Diagnosis not present

## 2016-02-01 DIAGNOSIS — G8929 Other chronic pain: Secondary | ICD-10-CM | POA: Diagnosis not present

## 2016-02-01 DIAGNOSIS — N411 Chronic prostatitis: Secondary | ICD-10-CM | POA: Diagnosis not present

## 2016-02-01 DIAGNOSIS — M5441 Lumbago with sciatica, right side: Secondary | ICD-10-CM | POA: Diagnosis not present

## 2016-02-01 DIAGNOSIS — Z0389 Encounter for observation for other suspected diseases and conditions ruled out: Secondary | ICD-10-CM | POA: Diagnosis not present

## 2016-02-01 DIAGNOSIS — M5442 Lumbago with sciatica, left side: Secondary | ICD-10-CM | POA: Diagnosis not present

## 2016-02-01 DIAGNOSIS — N200 Calculus of kidney: Secondary | ICD-10-CM | POA: Diagnosis not present

## 2016-02-01 LAB — PSA: PSA: 7.47

## 2016-03-08 DIAGNOSIS — M5442 Lumbago with sciatica, left side: Secondary | ICD-10-CM | POA: Diagnosis not present

## 2016-03-08 DIAGNOSIS — K4090 Unilateral inguinal hernia, without obstruction or gangrene, not specified as recurrent: Secondary | ICD-10-CM | POA: Diagnosis not present

## 2016-03-08 DIAGNOSIS — N50819 Testicular pain, unspecified: Secondary | ICD-10-CM | POA: Diagnosis not present

## 2016-03-08 DIAGNOSIS — G894 Chronic pain syndrome: Secondary | ICD-10-CM | POA: Diagnosis not present

## 2016-03-08 DIAGNOSIS — G5792 Unspecified mononeuropathy of left lower limb: Secondary | ICD-10-CM | POA: Insufficient documentation

## 2016-03-08 DIAGNOSIS — G8929 Other chronic pain: Secondary | ICD-10-CM | POA: Diagnosis not present

## 2016-03-08 DIAGNOSIS — M5441 Lumbago with sciatica, right side: Secondary | ICD-10-CM | POA: Diagnosis not present

## 2016-03-08 DIAGNOSIS — Z6835 Body mass index (BMI) 35.0-35.9, adult: Secondary | ICD-10-CM | POA: Diagnosis not present

## 2016-03-14 ENCOUNTER — Encounter: Payer: Self-pay | Admitting: Family Medicine

## 2016-03-14 ENCOUNTER — Ambulatory Visit (INDEPENDENT_AMBULATORY_CARE_PROVIDER_SITE_OTHER): Payer: PPO | Admitting: Family Medicine

## 2016-03-14 VITALS — BP 140/68 | HR 76 | Temp 98.8°F | Resp 16 | Ht 66.0 in | Wt 229.5 lb

## 2016-03-14 DIAGNOSIS — G4733 Obstructive sleep apnea (adult) (pediatric): Secondary | ICD-10-CM

## 2016-03-14 DIAGNOSIS — E785 Hyperlipidemia, unspecified: Secondary | ICD-10-CM

## 2016-03-14 DIAGNOSIS — E1142 Type 2 diabetes mellitus with diabetic polyneuropathy: Secondary | ICD-10-CM | POA: Diagnosis not present

## 2016-03-14 DIAGNOSIS — G629 Polyneuropathy, unspecified: Secondary | ICD-10-CM

## 2016-03-14 DIAGNOSIS — G894 Chronic pain syndrome: Secondary | ICD-10-CM

## 2016-03-14 DIAGNOSIS — G5792 Unspecified mononeuropathy of left lower limb: Secondary | ICD-10-CM

## 2016-03-14 DIAGNOSIS — I10 Essential (primary) hypertension: Secondary | ICD-10-CM

## 2016-03-14 LAB — POCT GLYCOSYLATED HEMOGLOBIN (HGB A1C): HEMOGLOBIN A1C: 6.8

## 2016-03-14 MED ORDER — PREGABALIN 200 MG PO CAPS: 200.0000 mg | ORAL_CAPSULE | Freq: Two times a day (BID) | ORAL | 5 refills | Status: DC

## 2016-03-14 MED ORDER — AMLODIPINE BESYLATE 10 MG PO TABS: 10.0000 mg | ORAL_TABLET | Freq: Every day | ORAL | 3 refills | Status: DC

## 2016-03-14 MED ORDER — DULOXETINE HCL 60 MG PO CPEP: 60.0000 mg | ORAL_CAPSULE | Freq: Every day | ORAL | 0 refills | Status: DC

## 2016-03-14 NOTE — Progress Notes (Signed)
Name: Ian Lucas   MRN: [id number]    DOB: 03-26-1951   Date:03/14/2016       Progress Note  Subjective  Chief Complaint  Chief Complaint  Patient presents with  . Diabetes  . Hyperlipidemia  . Sleep Apnea  . Pain    HPI  Diabetes type 2 with neuropathy: he is going to the [place] every 3 months, currently on Lantus 40 units. Per patient last hgbA1C was high at 8.9% ,6.8% and now 6.8% again. He  states he tried Gabapentin but it made him feel anxious, he is now on Lyrica 150 mg twice daily, but just seen by pain clinic at [facility] last week and advised to go up on Lyrica dose to 200 mg twice daily and also increased Cymbalta to 60 mg daily .  He has daily pain on his legs, buttocks and left hand, at this time 5/10 ( on left hand), he states leg burning and toes has resolved with higher dose of Lyrica. He denies polyphagia, polyuria or polydipsia. He has nocturia once or twice per night secondary to BPH . He has proteinuria also, last time urine micro 50 and is on ARB.  Paresthesia: he has tingling, numbness of left hand, going on for many months, [facility]  HTN: he has been taking medication, denies side effects, no chest pain or palpitation.   Hyperlipidemia: taking Atorvastatin. No chest pain, he has some buttocks and leg pain in arms.   BPH: sees Dr. Achilles Dunk, he has history of kidney stones, epididymitis and hydrocele repair left side. His PSA went up and is temporarily off testosterone.   OSA: he has the machine at home, but is not wearing, he was removing it during the night without knowing it. Advised to start using it again. He still has not resumed using it   Patient Active Problem List   Diagnosis Date Noted  . Chronic pain syndrome 03/08/2016  . 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 Elevated prostate specific antigen (PSA) 06/18/2015  . Benign essential HTN 06/18/2015  . Chronic midline low back pain with bilateral sciatica  06/18/2015  . Diabetic neuropathy (HCC) 06/18/2015  . Posttraumatic stress disorder 06/18/2015  . Dyslipidemia 06/18/2015  . Lichen simplex 06/18/2015  . History of herpes zoster 06/18/2015  . Restless leg 06/18/2015  . OSA (obstructive sleep apnea) 06/18/2015  . Polyneuropathy (HCC) 06/18/2015  . 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 Calculus of kidney 08/21/2013  . History of pancreatitis 08/15/2013  . Hydrocele 05/19/2013  . Arthritis of knee, degenerative 10/30/2012  . Lumbar canal stenosis 10/30/2012  . Chronic prostatitis 06/19/2012  . Hypogonadism in male 06/19/2012  . 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 Benign prostatic hyperplasia with urinary obstruction 06/19/2012    Past Surgical History:  Procedure Laterality Date  . BACK SURGERY    . CIRCUMCISION N/A   . HERNIA REPAIR    . HYDROCELE EXCISION / REPAIR Left     Family History  Problem Relation Age of Onset  . Stroke Mother   . Diabetes Father     Social History   Social History  . Marital status: Married    Spouse name: N/A  . Number of children: N/A  . Years of education: N/A   Occupational History  . Not on file.   Social History Main Topics  . Smoking status: Former Smoker    Packs/day: 0.25  Years: 30.00    Types: Cigarettes    Start date: 05/08/1968    Quit date: 06/17/1998  . Smokeless tobacco: Never Used  . Alcohol use No  . Drug use: No  . Sexual activity: Yes    Partners: Female   Other Topics Concern  . Not on file   Social History Narrative  . No narrative on file     Current Outpatient Prescriptions:  .  albuterol (PROVENTIL HFA;VENTOLIN HFA) 108 (90 Base) MCG/ACT inhaler, Inhale 2 puffs into the lungs every 4 (four) hours as needed for wheezing or shortness of breath., Disp: 1 Inhaler, Rfl: 0 .  amLODipine-atorvastatin (CADUET) 5-10 MG per tablet, Take 1 tablet by mouth daily., Disp: , Rfl:  .  aspirin 81 MG tablet, Take 81 mg by mouth  daily., Disp: , Rfl:  .  atorvastatin (LIPITOR) 40 MG tablet, Take 40 mg by mouth daily., Disp: , Rfl:  .  carvedilol (COREG) 25 MG tablet, Take 25 mg by mouth 2 (two) times daily with a meal., Disp: , Rfl:  .  co-enzyme Q-10 30 MG capsule, Take 100 mg by mouth daily., Disp: , Rfl:  .  DULoxetine (CYMBALTA) 20 MG capsule, Take 20 mg by mouth daily., Disp: , Rfl:  .  insulin glargine (LANTUS) 100 UNIT/ML injection, Inject 37 Units into the skin daily., Disp: , Rfl:  .  losartan (COZAAR) 100 MG tablet, Take 100 mg by mouth daily., Disp: , Rfl:  .  naproxen (NAPROSYN) 375 MG tablet, TAKE 1 TABLET (375 MG TOTAL) BY MOUTH 2 (TWO) TIMES DAILY., Disp: 20 tablet, Rfl: 0 .  nateglinide (STARLIX) 60 MG tablet, Take 1 tablet (60 mg total) by mouth 3 (three) times daily with meals., Disp: 90 tablet, Rfl: 2 .  prazosin (MINIPRESS) 2 MG capsule, Take 2 mg by mouth at bedtime., Disp: , Rfl:  .  pregabalin (LYRICA) 150 MG capsule, Take 1 capsule (150 mg total) by mouth 2 (two) times daily., Disp: 60 capsule, Rfl: 5 .  sildenafil (REVATIO) 20 MG tablet, Take 20 mg by mouth as needed., Disp: , Rfl:  .  tamsulosin (FLOMAX) 0.4 MG CAPS capsule, Take 0.4 mg by mouth daily., Disp: , Rfl:  .  testosterone cypionate (DEPOTESTOTERONE CYPIONATE) 100 MG/ML injection, Inject 200 mg into the muscle every 28 (twenty-eight) days. For IM use only, Disp: , Rfl:  .  tizanidine (ZANAFLEX) 2 MG capsule, Take 1 capsule (2 mg total) by mouth 3 (three) times daily., Disp: 30 capsule, Rfl: 0  Allergies  Allergen Reactions  . Banana Shortness Of Breath  . Codeine Itching  . Iodine Swelling    Pt states 30-40 years ago when he had x-ray dye his lips swelled and he threw up. SPM  . Ace Inhibitors Cough  . Gabapentin     Anxious and jumpy   . Metformin     diarrhea     ROS  Constitutional: Negative for fever or significant  weight change.  Respiratory: Negative for cough and shortness of breath.   Cardiovascular: Negative for  chest pain or palpitations.  Gastrointestinal: Negative for abdominal pain, no bowel changes.  Musculoskeletal: Negative for gait problem or joint swelling.  Skin: Negative for rash.  Neurological: Negative for dizziness or headache.  No other specific complaints in a complete review of systems (except as listed in HPI above).  Objective  Vitals:   03/14/16 0909  BP: 140/68  Pulse: 76  Resp: 16  Temp: 98.8 F (37.1 C)  SpO2: 95%  Weight: 229 lb 8 oz (104.1 kg)  Height: 5\' 6"  (1.676 m)    Body mass index is 37.04 kg/m.  Physical Exam  Constitutional: Patient appears well-developed and well-nourished. Obese  No distress.  HEENT: head atraumatic, normocephalic, pupils equal and reactive to light, neck supple, throat within normal limits Cardiovascular: Normal rate, regular rhythm and normal heart sounds.  No murmur heard. 1 plus BLE edema. Pulmonary/Chest: Effort normal and breath sounds normal. No respiratory distress. Abdominal: Soft.  There is no tenderness. Psychiatric: Patient has a normal mood and affect. behavior is normal. Judgment and thought content normal. Muscular Skeletal: back pain with palpation, decrease in sensation of ulnar nerve left hand  Recent Results (from the past 2160 hour(s))  PSA     Status: None   Collection Time: 02/01/16 12:00 AM  Result Value Ref Range   PSA 7.47     Comment: Done at Dr. 02/03/16 office [facility]  POCT HgB A1C     Status: None   Collection Time: 03/14/16  9:23 AM  Result Value Ref Range   Hemoglobin A1C 6.8      PHQ2/9: Depression screen [facility] 2/9 03/14/2016 12/06/2015 10/06/2015 09/14/2015 06/18/2015  Decreased Interest 0 0 0 0 0  Down, Depressed, Hopeless 0 0 0 0 0  PHQ - 2 Score 0 0 0 0 0     Fall Risk: Fall Risk  03/14/2016 12/06/2015 10/06/2015 09/14/2015 06/18/2015  Falls in the past year? No No No No Yes  Number falls in past yr: - - - - 1  Injury with Fall? - - - - No     Functional Status Survey: Is the patient deaf  or have difficulty hearing?: No Does the patient have difficulty seeing, even when wearing glasses/contacts?: No Does the patient have difficulty concentrating, remembering, or making decisions?: No Does the patient have difficulty walking or climbing stairs?: Yes Does the patient have difficulty dressing or bathing?: No Does the patient have difficulty doing errands alone such as visiting a doctor's office or shopping?: No   Assessment & Plan  1. Diabetic polyneuropathy associated with type 2 diabetes mellitus (HCC)  - POCT HgB A1C - pregabalin (LYRICA) 200 MG capsule; Take 1 capsule (200 mg total) by mouth 2 (two) times daily.  Dispense: 60 capsule; Refill: 5  2. Ilioinguinal neuralgia of left side  Follow pain clinic recommendation   3. Chronic pain syndrome  Adjust medications   4. Polyneuropathy (HCC)  - pregabalin (LYRICA) 200 MG capsule; Take 1 capsule (200 mg total) by mouth 2 (two) times daily.  Dispense: 60 capsule; Refill: 5  5. Benign essential HTN  At goal  6. Dyslipidemia  He has labs done at work   7. OSA (obstructive sleep apnea)  Needs to resume CPAP

## 2016-03-22 DIAGNOSIS — Z981 Arthrodesis status: Secondary | ICD-10-CM | POA: Diagnosis not present

## 2016-03-22 DIAGNOSIS — M4120 Other idiopathic scoliosis, site unspecified: Secondary | ICD-10-CM | POA: Diagnosis not present

## 2016-03-27 DIAGNOSIS — R972 Elevated prostate specific antigen [PSA]: Secondary | ICD-10-CM | POA: Diagnosis not present

## 2016-04-03 ENCOUNTER — Other Ambulatory Visit: Payer: Self-pay | Admitting: Optometry

## 2016-04-03 DIAGNOSIS — M4120 Other idiopathic scoliosis, site unspecified: Secondary | ICD-10-CM

## 2016-04-05 DIAGNOSIS — R972 Elevated prostate specific antigen [PSA]: Secondary | ICD-10-CM | POA: Diagnosis not present

## 2016-04-07 ENCOUNTER — Other Ambulatory Visit: Payer: Self-pay | Admitting: Family Medicine

## 2016-04-11 ENCOUNTER — Encounter: Payer: Self-pay | Admitting: Internal Medicine

## 2016-04-13 ENCOUNTER — Ambulatory Visit: Payer: PPO

## 2016-04-22 ENCOUNTER — Other Ambulatory Visit: Payer: PPO

## 2016-04-27 ENCOUNTER — Ambulatory Visit
Admission: RE | Admit: 2016-04-27 | Discharge: 2016-04-27 | Disposition: A | Discharge status: Home / Self Care | Payer: PPO | Source: Ambulatory Visit | Attending: Optometry | Admitting: Optometry

## 2016-04-27 DIAGNOSIS — M5126 Other intervertebral disc displacement, lumbar region: Secondary | ICD-10-CM | POA: Diagnosis not present

## 2016-04-27 DIAGNOSIS — M48061 Spinal stenosis, lumbar region without neurogenic claudication: Secondary | ICD-10-CM | POA: Insufficient documentation

## 2016-04-27 DIAGNOSIS — M4120 Other idiopathic scoliosis, site unspecified: Secondary | ICD-10-CM | POA: Insufficient documentation

## 2016-04-27 DIAGNOSIS — M545 Low back pain: Secondary | ICD-10-CM | POA: Diagnosis not present

## 2016-04-27 DIAGNOSIS — S3992XA Unspecified injury of lower back, initial encounter: Secondary | ICD-10-CM | POA: Diagnosis not present

## 2016-04-27 MED ORDER — GADOBENATE DIMEGLUMINE 529 MG/ML IV SOLN
20.0000 mL | Freq: Once | INTRAVENOUS | Status: AC | PRN
  Administered 2016-04-27: 20 mL via INTRAVENOUS

## 2016-05-17 DIAGNOSIS — M4120 Other idiopathic scoliosis, site unspecified: Secondary | ICD-10-CM | POA: Diagnosis not present

## 2016-05-20 ENCOUNTER — Ambulatory Visit (INDEPENDENT_AMBULATORY_CARE_PROVIDER_SITE_OTHER): Payer: PPO

## 2016-05-20 ENCOUNTER — Ambulatory Visit
Admission: EM | Admit: 2016-05-20 | Discharge: 2016-05-20 | Disposition: A | Discharge status: Home / Self Care | Payer: PPO | Attending: Emergency Medicine | Admitting: Emergency Medicine

## 2016-05-20 DIAGNOSIS — J189 Pneumonia, unspecified organism: Secondary | ICD-10-CM

## 2016-05-20 DIAGNOSIS — J101 Influenza due to other identified influenza virus with other respiratory manifestations: Secondary | ICD-10-CM

## 2016-05-20 DIAGNOSIS — R05 Cough: Secondary | ICD-10-CM | POA: Diagnosis not present

## 2016-05-20 LAB — RAPID INFLUENZA A&B ANTIGENS (ARMC ONLY): INFLUENZA A (ARMC): POSITIVE — AB

## 2016-05-20 LAB — RAPID INFLUENZA A&B ANTIGENS: Influenza B (ARMC): NEGATIVE

## 2016-05-20 MED ORDER — IPRATROPIUM-ALBUTEROL 0.5-2.5 (3) MG/3ML IN SOLN
3.0000 mL | Freq: Once | RESPIRATORY_TRACT | Status: AC
  Administered 2016-05-20: 3 mL via RESPIRATORY_TRACT

## 2016-05-20 MED ORDER — IBUPROFEN 600 MG PO TABS: 600.0000 mg | ORAL_TABLET | Freq: Four times a day (QID) | ORAL | 0 refills | Status: AC | PRN

## 2016-05-20 MED ORDER — HYDROCOD POLST-CPM POLST ER 10-8 MG/5ML PO SUER: 5.0000 mL | Freq: Two times a day (BID) | ORAL | 0 refills | Status: DC | PRN

## 2016-05-20 MED ORDER — ALBUTEROL SULFATE HFA 108 (90 BASE) MCG/ACT IN AERS: 2.0000 | INHALATION_SPRAY | RESPIRATORY_TRACT | 0 refills | Status: DC | PRN

## 2016-05-20 MED ORDER — AEROCHAMBER PLUS MISC: 2 refills | Status: DC

## 2016-05-20 MED ORDER — AZITHROMYCIN 250 MG PO TABS: 250.0000 mg | ORAL_TABLET | Freq: Every day | ORAL | 0 refills | Status: DC

## 2016-05-20 NOTE — ED Provider Notes (Signed)
HPI  SUBJECTIVE:  Ian Lucas is a 66 y.o. male who presents with nonproductive cough with wheezing for the chest congestion for the past week. He reports chest soreness secondary to the cough, shortness of breath and fatigue. He denies chest pain, heaviness, tightness. No preceding URI symptoms or flulike symptoms. No nasal congestion, postnasal drip, rhinorrhea, bodyaches, headaches. States that his ears feel full but doesn't have any pain. unable sleep at night secondary to the cough. He tried cough syrup, Coricidin high blood pressure. No aggravating or alleviating factors. No acid reflux type symptoms. He reports fevers Tmax 100 this morning. No shortness of breath with exertion,, lower extremity edema, orthopnea, abdominal pain, PND.  reports nocturia, and a 20 pound unintentional weight gain over the past month. No posttussive emesis. No sick contacts. No antibiotics in the past month. No antipyretic in the past 6-8 hours. He did  get a flu shot. He is a past medical history of pneumonia, diabetes. States his glucose has been running within normal limits. He is on a fixed insulin regimen and does not adjust for hyperglycemia. Also history of hypertension. No history of GERD, CHF, asthma, emphysema, COPD, smoking, MI, coronary artery disease, kidney disease or gastrointestinal bleeding. PMD: Ruel Favors, MD   Past Medical History:  Diagnosis Date  . Diabetes mellitus without complication (HCC)   . Hypercholesteremia   . Hypertension     Past Surgical History:  Procedure Laterality Date  . BACK SURGERY    . CIRCUMCISION N/A   . HERNIA REPAIR    . HYDROCELE EXCISION / REPAIR Left     Family History  Problem Relation Age of Onset  . Stroke Mother   . Diabetes Father     Social History  Substance Use Topics  . Smoking status: Former Smoker    Packs/day: 0.25    Years: 30.00    Types: Cigarettes    Start date: 05/08/1968    Quit date: 06/17/1998  . Smokeless tobacco:  Never Used  . Alcohol use No    No current facility-administered medications for this encounter.   Current Outpatient Prescriptions:  .  albuterol (PROVENTIL HFA;VENTOLIN HFA) 108 (90 Base) MCG/ACT inhaler, Inhale 2 puffs into the lungs every 4 (four) hours as needed for wheezing or shortness of breath., Disp: 1 Inhaler, Rfl: 0 .  amLODipine (NORVASC) 10 MG tablet, Take 1 tablet (10 mg total) by mouth daily., Disp: 90 tablet, Rfl: 3 .  aspirin 81 MG tablet, Take 81 mg by mouth daily., Disp: , Rfl:  .  atorvastatin (LIPITOR) 40 MG tablet, Take 40 mg by mouth daily., Disp: , Rfl:  .  azithromycin (ZITHROMAX) 250 MG tablet, Take 1 tablet (250 mg total) by mouth daily. 2 tabs po on day 1, 1 tab po on days 2-5, Disp: 6 tablet, Rfl: 0 .  carvedilol (COREG) 25 MG tablet, Take 25 mg by mouth 2 (two) times daily with a meal., Disp: , Rfl:  .  chlorpheniramine-HYDROcodone (TUSSIONEX PENNKINETIC ER) 10-8 MG/5ML SUER, Take 5 mLs by mouth every 12 (twelve) hours as needed for cough., Disp: 120 mL, Rfl: 0 .  co-enzyme Q-10 30 MG capsule, Take 100 mg by mouth daily., Disp: , Rfl:  .  DULoxetine (CYMBALTA) 60 MG capsule, Take 1 capsule (60 mg total) by mouth daily., Disp: 30 capsule, Rfl: 0 .  ibuprofen (ADVIL,MOTRIN) 600 MG tablet, Take 1 tablet (600 mg total) by mouth every 6 (six) hours as needed., Disp: 30 tablet, Rfl:  0 .  insulin glargine (LANTUS) 100 UNIT/ML injection, Inject 37 Units into the skin daily., Disp: , Rfl:  .  losartan (COZAAR) 100 MG tablet, Take 100 mg by mouth daily., Disp: , Rfl:  .  prazosin (MINIPRESS) 2 MG capsule, Take 2 mg by mouth at bedtime., Disp: , Rfl:  .  pregabalin (LYRICA) 200 MG capsule, Take 1 capsule (200 mg total) by mouth 2 (two) times daily., Disp: 60 capsule, Rfl: 5 .  sildenafil (REVATIO) 20 MG tablet, Take 20 mg by mouth as needed., Disp: , Rfl:  .  Spacer/Aero-Holding Chambers (AEROCHAMBER PLUS) inhaler, Use as instructed, Disp: 1 each, Rfl: 2 .  tizanidine  (ZANAFLEX) 2 MG capsule, Take 1 capsule (2 mg total) by mouth 3 (three) times daily., Disp: 30 capsule, Rfl: 0  Allergies  Allergen Reactions  . Banana Shortness Of Breath  . Codeine Itching  . Iodine Swelling    Pt states 30-40 years ago when he had x-ray dye his lips swelled and he threw up. SPM  . Ace Inhibitors Cough  . Gabapentin     Anxious and jumpy   . Metformin     diarrhea     ROS  As noted in HPI.   Physical Exam  BP 137/82 (BP Location: Right Arm)   Pulse 95   Temp 100 F (37.8 C) (Oral)   Resp 20   Ht 5\' 6"  (1.676 m)   Wt 225 lb (102.1 kg)   SpO2 94%   BMI 36.32 kg/m   Constitutional: Well developed, well nourished, no acute distress Eyes: PERRL, EOMI, conjunctiva normal bilaterally HENT: Normocephalic, atraumatic,mucus membranes moist. Normal turbinates. No nasal congestion. TMs normal bilaterally. Positive postnasal drip Respiratory: Poor inspiratory effort, fair air movement. Clear to auscultation bilaterally, no rales, positive end expiratory wheezing throughout all lung fields, no rhonchi Cardiovascular: Normal rate and rhythm, no murmurs, no gallops, no rubs GI: nondistended skin: No rash, skin intact Musculoskeletal: Calves symmetric, nontender, trace edema bilaterally, no tenderness, no deformities Neurologic: Alert & oriented x 3, CN II-XII grossly intact, no motor deficits, sensation grossly intact Psychiatric: Speech and behavior appropriate   ED Course   Medications  ipratropium-albuterol (DUONEB) 0.5-2.5 (3) MG/3ML nebulizer solution 3 mL (3 mLs Nebulization Given 05/20/16 0908)    Orders Placed This Encounter  Procedures  . Rapid Influenza A&B Antigens ([facility] only)    Standing Status:   Standing    Number of Occurrences:   1  . DG Chest 2 View    Standing Status:   Standing    Number of Occurrences:   1    Order Specific Question:   Reason for Exam (SYMPTOM  OR DIAGNOSIS REQUIRED)    Answer:   cough wheeze r/o PNA, effusion, pulm  edema  . Droplet precaution    Standing Status:   Standing    Number of Occurrences:   1   Results for orders placed or performed during the hospital encounter of 05/20/16 (from the past 24 hour(s))  Rapid Influenza A&B Antigens ([facility] only)     Status: Abnormal   Collection Time: 05/20/16  8:49 AM  Result Value Ref Range   Influenza A ([facility]) POSITIVE (A) NEGATIVE   Influenza B ([facility]) NEGATIVE NEGATIVE   Dg Chest 2 View  Result Date: 05/20/2016 CLINICAL DATA:  Cough and fever EXAM: CHEST  2 VIEW COMPARISON:  November 29, 2015 FINDINGS: The heart size and mediastinal contours are within normal limits. Both lungs are clear. The visualized  skeletal structures are unremarkable. IMPRESSION: No active cardiopulmonary disease. Electronically Signed   By: Gerome Sam III M.D   On: 05/20/2016 09:46    ED Clinical Impression  Influenza A  Community acquired pneumonia, unspecified laterality   ED Assessment/Plan  Giving DuoNeb because of the wheezing. Checking chest x-ray to rule out pneumonia. CHF in the differential, but think this is less likely. We'll reevaluate.  Patient flu A positive, but he is out of the window of Tamiflu.   Imaging independently reviewed. Both lungs clear. No active cardiopulmonary disease per radiology. See radiology report for details.  On reevaluation, patient states he feels significantly better. Lungs clear bilaterally. Improved air movement on repeat physical exam.  Patient has increased markings on the lateral, but no definite consolidation per my read. While he has no distinct radiographic evidence of pneumonia, his presentation is most consistent with a community-acquired pneumonia secondary to the influenza. Sending home on albuterol with spacer and azithromycin Z-Pak, cough syrup, ibuprofen 600 mg 1 g of Tylenol 4 times a day to cover pneumonia. Push fluids. Follow up with PMD in several days, to the ED if gets worse.  Discussed imaging, MDM, plan and  followup with patient . Discussed sn/sx that should prompt return to the ED. Patient agrees with plan.   Meds ordered this encounter  Medications  . ipratropium-albuterol (DUONEB) 0.5-2.5 (3) MG/3ML nebulizer solution 3 mL  . albuterol (PROVENTIL HFA;VENTOLIN HFA) 108 (90 Base) MCG/ACT inhaler    Sig: Inhale 2 puffs into the lungs every 4 (four) hours as needed for wheezing or shortness of breath.    Dispense:  1 Inhaler    Refill:  0  . Spacer/Aero-Holding Chambers (AEROCHAMBER PLUS) inhaler    Sig: Use as instructed    Dispense:  1 each    Refill:  2  . chlorpheniramine-HYDROcodone (TUSSIONEX PENNKINETIC ER) 10-8 MG/5ML SUER    Sig: Take 5 mLs by mouth every 12 (twelve) hours as needed for cough.    Dispense:  120 mL    Refill:  0  . ibuprofen (ADVIL,MOTRIN) 600 MG tablet    Sig: Take 1 tablet (600 mg total) by mouth every 6 (six) hours as needed.    Dispense:  30 tablet    Refill:  0  . azithromycin (ZITHROMAX) 250 MG tablet    Sig: Take 1 tablet (250 mg total) by mouth daily. 2 tabs po on day 1, 1 tab po on days 2-5    Dispense:  6 tablet    Refill:  0    *This clinic note was created using Scientist, clinical (histocompatibility and immunogenetics). Therefore, there may be occasional mistakes despite careful proofreading.  ?   Domenick Gong, MD 05/20/16 1009

## 2016-05-20 NOTE — ED Notes (Signed)
Pt with expiratory wheezing noted post-nebulization

## 2016-05-20 NOTE — ED Triage Notes (Addendum)
Pt reports one week of non-productive cough, chest congestion, low evergy, feels "drunk".  No pain. Concern for pneumonia

## 2016-05-29 ENCOUNTER — Ambulatory Visit (INDEPENDENT_AMBULATORY_CARE_PROVIDER_SITE_OTHER): Payer: PPO | Admitting: Family Medicine

## 2016-05-29 ENCOUNTER — Encounter: Payer: Self-pay | Admitting: Family Medicine

## 2016-05-29 VITALS — BP 122/76 | HR 75 | Temp 98.7°F | Resp 18 | Ht 66.0 in | Wt 227.3 lb

## 2016-05-29 DIAGNOSIS — E1142 Type 2 diabetes mellitus with diabetic polyneuropathy: Secondary | ICD-10-CM | POA: Diagnosis not present

## 2016-05-29 DIAGNOSIS — J189 Pneumonia, unspecified organism: Secondary | ICD-10-CM

## 2016-05-29 DIAGNOSIS — R062 Wheezing: Secondary | ICD-10-CM | POA: Diagnosis not present

## 2016-05-29 DIAGNOSIS — R0989 Other specified symptoms and signs involving the circulatory and respiratory systems: Secondary | ICD-10-CM

## 2016-05-29 MED ORDER — FLUTICASONE FUROATE-VILANTEROL 200-25 MCG/INH IN AEPB: 1.0000 | INHALATION_SPRAY | Freq: Every day | RESPIRATORY_TRACT | 0 refills | Status: DC

## 2016-05-29 MED ORDER — BENZONATATE 100 MG PO CAPS: 100.0000 mg | ORAL_CAPSULE | Freq: Three times a day (TID) | ORAL | 0 refills | Status: DC | PRN

## 2016-05-29 NOTE — Progress Notes (Signed)
Name: Ian Lucas   MRN: [id number]    DOB: Oct 31, 1950   Date:05/29/2016       Progress Note  Subjective  Chief Complaint  Chief Complaint  Patient presents with  . Influenza    Follow up when to [facility] and was diagnosis with Flu and Pneumonia  . Pneumonia    Patient has been taking antibiotic, breathing treatment and cough medication. But Cough still has not gone away. [facility] but no appointment has been made yet.    HPI  CAP: he developed flu symptoms 2 weeks ago, and about one week later symptoms got worse, with worsening of  cough, SOB, dizziness, fatigue, and temperature was raising again.  He was seen at Urgent Care and diagnosed with flu and CAP. He was sent home on Albuterol , Zpack and tussionex. He still has a dry cough. He is wheezing since last CPA in August 2018.  He states glucose has been under control, in the low 100's in the mornings. His appetite is normal .  Patient Active Problem List   Diagnosis Date Noted  . Chronic pain syndrome 03/08/2016  . 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 Elevated prostate specific antigen (PSA) 06/18/2015  . Benign essential HTN 06/18/2015  . Chronic midline low back pain with bilateral sciatica 06/18/2015  . Diabetic neuropathy (HCC) 06/18/2015  . Posttraumatic stress disorder 06/18/2015  . Dyslipidemia 06/18/2015  . Lichen simplex 06/18/2015  . History of herpes zoster 06/18/2015  . Restless leg 06/18/2015  . OSA (obstructive sleep apnea) 06/18/2015  . Polyneuropathy (HCC) 06/18/2015  . 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 Calculus of kidney 08/21/2013  . History of pancreatitis 08/15/2013  . Hydrocele 05/19/2013  . Arthritis of knee, degenerative 10/30/2012  . Lumbar canal stenosis 10/30/2012  . Chronic prostatitis 06/19/2012  . Hypogonadism in male 06/19/2012  . 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 Benign prostatic  hyperplasia with urinary obstruction 06/19/2012    Past Surgical History:  Procedure Laterality Date  . BACK SURGERY    . CIRCUMCISION N/A   . HERNIA REPAIR    . HYDROCELE EXCISION / REPAIR Left     Family History  Problem Relation Age of Onset  . Stroke Mother   . Diabetes Father     Social History   Social History  . Marital status: Married    Spouse name: N/A  . Number of children: N/A  . Years of education: N/A   Occupational History  . Not on file.   Social History Main Topics  . Smoking status: Former Smoker    Packs/day: 0.25    Years: 30.00    Types: Cigarettes    Start date: 05/08/1968    Quit date: 06/17/1998  . Smokeless tobacco: Never Used  . Alcohol use No  . Drug use: No  . Sexual activity: Yes    Partners: Female   Other Topics Concern  . Not on file   Social History Narrative  . No narrative on file     Current Outpatient Prescriptions:  .  albuterol (PROVENTIL HFA;VENTOLIN HFA) 108 (90 Base) MCG/ACT inhaler, Inhale 2 puffs into the lungs every 4 (four) hours as needed for wheezing or shortness of breath., Disp: 1 Inhaler, Rfl: 0 .  amLODipine (NORVASC) 10 MG tablet, Take 1 tablet (10 mg total) by mouth daily., Disp: 90 tablet, Rfl: 3 .  aspirin 81 MG tablet, Take  81 mg by mouth daily., Disp: , Rfl:  .  atorvastatin (LIPITOR) 40 MG tablet, Take 40 mg by mouth daily., Disp: , Rfl:  .  carvedilol (COREG) 25 MG tablet, Take 25 mg by mouth 2 (two) times daily with a meal., Disp: , Rfl:  .  chlorpheniramine-HYDROcodone (TUSSIONEX PENNKINETIC ER) 10-8 MG/5ML SUER, Take 5 mLs by mouth every 12 (twelve) hours as needed for cough., Disp: 120 mL, Rfl: 0 .  co-enzyme Q-10 30 MG capsule, Take 100 mg by mouth daily., Disp: , Rfl:  .  DULoxetine (CYMBALTA) 60 MG capsule, Take 1 capsule (60 mg total) by mouth daily., Disp: 30 capsule, Rfl: 0 .  ibuprofen (ADVIL,MOTRIN) 600 MG tablet, Take 1 tablet (600 mg total) by mouth every 6 (six) hours as needed., Disp: 30  tablet, Rfl: 0 .  insulin glargine (LANTUS) 100 UNIT/ML injection, Inject 40 Units into the skin daily. , Disp: , Rfl:  .  losartan (COZAAR) 100 MG tablet, Take 100 mg by mouth daily., Disp: , Rfl:  .  pregabalin (LYRICA) 200 MG capsule, Take 1 capsule (200 mg total) by mouth 2 (two) times daily., Disp: 60 capsule, Rfl: 5 .  sildenafil (REVATIO) 20 MG tablet, Take 20 mg by mouth as needed., Disp: , Rfl:  .  Spacer/Aero-Holding Chambers (AEROCHAMBER PLUS) inhaler, Use as instructed, Disp: 1 each, Rfl: 2 .  prazosin (MINIPRESS) 2 MG capsule, Take 2 mg by mouth at bedtime., Disp: , Rfl:   Allergies  Allergen Reactions  . Banana Shortness Of Breath  . Codeine Itching  . Iodine Swelling    Pt states 30-40 years ago when he had x-ray dye his lips swelled and he threw up. SPM  . Ace Inhibitors Cough  . Gabapentin     Anxious and jumpy   . Metformin     diarrhea     ROS  Ten systems reviewed and is negative except as mentioned in HPI   Objective  Vitals:   05/29/16 1559  BP: 122/76  Pulse: 75  Resp: 18  Temp: 98.7 F (37.1 C)  TempSrc: Oral  SpO2: 99%  Weight: 227 lb 4.8 oz (103.1 kg)  Height: 5\' 6"  (1.676 m)    Body mass index is 36.69 kg/m.  Physical Exam  Constitutional: Patient appears well-developed and well-nourished. Obese  No distress.  HEENT: head atraumatic, normocephalic, pupils equal and reactive to light, neck supple, throat within normal limits Cardiovascular: Normal rate, regular rhythm and normal heart sounds.  No murmur heard. No BLE edema. Pulmonary/Chest: Effort normal, coarse crackles on both bases.No respiratory distress. Abdominal: Soft.  There is no tenderness. Psychiatric: Patient has a normal mood and affect. behavior is normal. Judgment and thought content normal.  Recent Results (from the past 2160 hour(s))  POCT HgB A1C     Status: None   Collection Time: 03/14/16  9:23 AM  Result Value Ref Range   Hemoglobin A1C 6.8   Rapid Influenza A&B  Antigens ([facility] only)     Status: Abnormal   Collection Time: 05/20/16  8:49 AM  Result Value Ref Range   Influenza A [facility]) POSITIVE (A) NEGATIVE   Influenza B ([facility]) NEGATIVE NEGATIVE     PHQ2/9: Depression screen [facility] 2/9 05/29/2016 03/14/2016 12/06/2015 10/06/2015 09/14/2015  Decreased Interest 0 0 0 0 0  Down, Depressed, Hopeless 0 0 0 0 0  PHQ - 2 Score 0 0 0 0 0     Fall Risk: Fall Risk  05/29/2016 03/14/2016 12/06/2015 10/06/2015  09/14/2015  Falls in the past year? Yes No No No No  Number falls in past yr: 2 or more - - - -  Injury with Fall? No - - - -    Functional Status Survey: Is the patient deaf or have difficulty hearing?: No Does the patient have difficulty seeing, even when wearing glasses/contacts?: No Does the patient have difficulty concentrating, remembering, or making decisions?: No Does the patient have difficulty walking or climbing stairs?: No Does the patient have difficulty dressing or bathing?: No Does the patient have difficulty doing errands alone such as visiting a doctor's office or shopping?: No    Assessment & Plan  1. Community acquired pneumonia, unspecified laterality  Likely viral, still has a cough and wheezing, we will add Breo , keep appointment with pulmonologist, [facility] - benzonatate (TESSALON) 100 MG capsule; Take 1-2 capsules (100-200 mg total) by mouth 3 (three) times daily as needed.  Dispense: 40 capsule; Refill: 0  2. Diabetic polyneuropathy associated with type 2 diabetes mellitus (HCC)  Continue monitoring glucose  3. Wheezing  - fluticasone furoate-vilanterol (BREO ELLIPTA) 200-25 MCG/INH AEPB; Inhale 1 puff into the lungs daily.  Dispense: 28 each; Refill: 0 - Brain natriuretic peptide  4. Bibasilar crackles  Normal CXR during Urgent Care visit  - benzonatate (TESSALON) 100 MG capsule; Take 1-2 capsules (100-200 mg total) by mouth 3 (three) times daily as needed.  Dispense: 40 capsule; Refill: 0

## 2016-06-21 ENCOUNTER — Ambulatory Visit (INDEPENDENT_AMBULATORY_CARE_PROVIDER_SITE_OTHER): Payer: PPO | Admitting: Family Medicine

## 2016-06-21 ENCOUNTER — Encounter: Payer: Self-pay | Admitting: Family Medicine

## 2016-06-21 VITALS — BP 120/78 | HR 76 | Temp 98.3°F | Resp 16 | Ht 66.0 in | Wt 228.6 lb

## 2016-06-21 DIAGNOSIS — J189 Pneumonia, unspecified organism: Secondary | ICD-10-CM

## 2016-06-21 DIAGNOSIS — R202 Paresthesia of skin: Secondary | ICD-10-CM

## 2016-06-21 DIAGNOSIS — E1142 Type 2 diabetes mellitus with diabetic polyneuropathy: Secondary | ICD-10-CM

## 2016-06-21 NOTE — Progress Notes (Signed)
Name: Ian Lucas   MRN: [id number]    DOB: [birth year]   Date:06/21/2016       Progress Note  Subjective  Chief Complaint  Chief Complaint  Patient presents with  . Pneumonia    3 week follow up    HPI  CAP: he was diagnosed at Urgent Care back in January, treated with Z-pack and followed up with me because he was still coughing. We gave him tessalon and also Breo. He denies SOB, cough or wheezing. Pneumonia shots is up to date. He is going [facility]  DMII with neuropathy: he has a long history of diabetic neuropathy and takes Lyrica to control the burning and tingling sensation, however over the past few months he has noticed that both hands have itching and tingling. Glucose has been under control. He goes to the [place]. Continue Lyrica for now, I will check B12 level today, likely from DM. He denies neck pain and has a diagnosis of DM neuropathy of feet already    Patient Active Problem List   Diagnosis Date Noted  . Chronic pain syndrome 03/08/2016  . 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 Elevated prostate specific antigen (PSA) 06/18/2015  . Benign essential HTN 06/18/2015  . Chronic midline low back pain with bilateral sciatica 06/18/2015  . Diabetic neuropathy (HCC) 06/18/2015  . Posttraumatic stress disorder 06/18/2015  . Dyslipidemia 06/18/2015  . Lichen simplex 06/18/2015  . History of herpes zoster 06/18/2015  . Restless leg 06/18/2015  . OSA (obstructive sleep apnea) 06/18/2015  . Polyneuropathy (HCC) 06/18/2015  . 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 Calculus of kidney 08/21/2013  . History of pancreatitis 08/15/2013  . Hydrocele 05/19/2013  . Arthritis of knee, degenerative 10/30/2012  . Lumbar canal stenosis 10/30/2012  . Chronic prostatitis 06/19/2012  . Hypogonadism in male 06/19/2012  . 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 Benign prostatic hyperplasia with urinary obstruction  06/19/2012    Past Surgical History:  Procedure Laterality Date  . BACK SURGERY    . CIRCUMCISION N/A   . HERNIA REPAIR    . HYDROCELE EXCISION / REPAIR Left     Family History  Problem Relation Age of Onset  . Stroke Mother   . Diabetes Father     Social History   Social History  . Marital status: Married    Spouse name: N/A  . Number of children: N/A  . Years of education: N/A   Occupational History  . Not on file.   Social History Main Topics  . Smoking status: Former Smoker    Packs/day: 0.25    Years: 30.00    Types: Cigarettes    Start date: 05/08/1968    Quit date: 06/17/1998  . Smokeless tobacco: Never Used  . Alcohol use No  . Drug use: No  . Sexual activity: Yes    Partners: Female   Other Topics Concern  . Not on file   Social History Narrative  . No narrative on file     Current Outpatient Prescriptions:  .  albuterol (PROVENTIL HFA;VENTOLIN HFA) 108 (90 Base) MCG/ACT inhaler, Inhale 2 puffs into the lungs every 4 (four) hours as needed for wheezing or shortness of breath., Disp: 1 Inhaler, Rfl: 0 .  amLODipine (NORVASC) 10 MG tablet, Take 1 tablet (10 mg total) by mouth daily., Disp: 90 tablet, Rfl: 3 .  aspirin 81 MG tablet, Take 81 mg by mouth daily., Disp: , Rfl:  .  atorvastatin (LIPITOR) 40 MG tablet, Take 40 mg by mouth daily., Disp: , Rfl:  .  benzonatate (TESSALON) 100 MG capsule, Take 1-2 capsules (100-200 mg total) by mouth 3 (three) times daily as needed., Disp: 40 capsule, Rfl: 0 .  carvedilol (COREG) 25 MG tablet, Take 25 mg by mouth 2 (two) times daily with a meal., Disp: , Rfl:  .  co-enzyme Q-10 30 MG capsule, Take 100 mg by mouth daily., Disp: , Rfl:  .  DULoxetine (CYMBALTA) 60 MG capsule, Take 1 capsule (60 mg total) by mouth daily., Disp: 30 capsule, Rfl: 0 .  fluticasone furoate-vilanterol (BREO ELLIPTA) 200-25 MCG/INH AEPB, Inhale 1 puff into the lungs daily., Disp: 28 each, Rfl: 0 .  ibuprofen (ADVIL,MOTRIN) 600 MG tablet,  Take 1 tablet (600 mg total) by mouth every 6 (six) hours as needed., Disp: 30 tablet, Rfl: 0 .  insulin glargine (LANTUS) 100 UNIT/ML injection, Inject 40 Units into the skin daily. , Disp: , Rfl:  .  losartan (COZAAR) 100 MG tablet, Take 100 mg by mouth daily., Disp: , Rfl:  .  prazosin (MINIPRESS) 2 MG capsule, Take 2 mg by mouth at bedtime., Disp: , Rfl:  .  pregabalin (LYRICA) 200 MG capsule, Take 1 capsule (200 mg total) by mouth 2 (two) times daily., Disp: 60 capsule, Rfl: 5 .  sildenafil (REVATIO) 20 MG tablet, Take 20 mg by mouth as needed., Disp: , Rfl:  .  Spacer/Aero-Holding Chambers (AEROCHAMBER PLUS) inhaler, Use as instructed, Disp: 1 each, Rfl: 2  Allergies  Allergen Reactions  . Banana Shortness Of Breath  . Codeine Itching  . Iodine Swelling    Pt states 30-40 years ago when he had x-ray dye his lips swelled and he threw up. SPM  . Ace Inhibitors Cough  . Gabapentin     Anxious and jumpy   . Metformin     diarrhea     ROS  Ten systems reviewed and is negative except as mentioned in HPI   Objective  Vitals:   06/21/16 1044  BP: 120/78  Pulse: 76  Resp: 16  Temp: 98.3 F (36.8 C)  SpO2: 95%  Weight: 228 lb 9 oz (103.7 kg)  Height: 5\' 6"  (1.676 m)    Body mass index is 36.89 kg/m.  Physical Exam  Constitutional: Patient appears well-developed and well-nourished. Obese No distress.  HEENT: head atraumatic, normocephalic, pupils equal and reactive to light,  neck supple, throat within normal limits Cardiovascular: Normal rate, regular rhythm and normal heart sounds.  No murmur heard. No BLE edema. Pulmonary/Chest: Effort normal and breath sounds normal. No respiratory distress. Abdominal: Soft.  There is no tenderness. Psychiatric: Patient has a normal mood and affect. behavior is normal. Judgment and thought content normal.  Recent Results (from the past 2160 hour(s))  Rapid Influenza A&B Antigens ([facility] only)     Status: Abnormal   Collection Time:  05/20/16  8:49 AM  Result Value Ref Range   Influenza A ([facility]) POSITIVE (A) NEGATIVE   Influenza B ([facility]) NEGATIVE NEGATIVE    Diabetic Foot Exam: Diabetic Foot Exam - Simple   Simple Foot Form Diabetic Foot exam was performed with the following findings:  Yes 06/21/2016 11:35 AM  Visual Inspection No deformities, no ulcerations, no other skin breakdown bilaterally:  Yes Sensation Testing See comments:  Yes Pulse Check Posterior Tibialis and Dorsalis pulse intact bilaterally:  Yes Comments Decrease in sensation of 5th toe bilaterally       PHQ2/9: Depression  [facility] 2/9 05/29/2016 03/14/2016 12/06/2015 10/06/2015 09/14/2015  Decreased Interest 0 0 0 0 0  Down, Depressed, Hopeless 0 0 0 0 0  PHQ - 2 Score 0 0 0 0 0     Fall Risk: Fall Risk  05/29/2016 03/14/2016 12/06/2015 10/06/2015 09/14/2015  Falls in the past year? Yes No No No No  Number falls in past yr: 2 or more - - - -  Injury with Fall? No - - - -     Assessment & Plan  1. Community acquired pneumonia, unspecified laterality  Doing well, no longer coughing or having SOB. Stopped Breo. [facility] a [facility]ologist because of recurrent respiratory symptoms. He is currently waiting for the referral   2. Diabetic polyneuropathy associated with type 2 diabetes mellitus (HCC)  Failed monofilament test on right foot, also has noticed tingling sensation on both hands, already on Lyrica and is on [place] disability for DM  3. Paresthesia of both hands  - Vitamin B12

## 2016-06-22 LAB — VITAMIN B12: VITAMIN B 12: 479 pg/mL (ref 200–1100)

## 2016-06-23 ENCOUNTER — Telehealth: Payer: Self-pay | Admitting: Family Medicine

## 2016-06-23 NOTE — Telephone Encounter (Signed)
Returning your call think it is pertaining to test results

## 2016-06-26 ENCOUNTER — Institutional Professional Consult (permissible substitution): Payer: PPO | Admitting: Pulmonary Disease

## 2016-07-17 ENCOUNTER — Ambulatory Visit: Payer: PPO | Admitting: Family Medicine

## 2016-07-30 NOTE — Progress Notes (Signed)
[name] Pulmonary Medicine Consultation      Assessment and Plan:  Restrictive lung disease.  --Review of CT and CXR images shows only some mild change of bronchitis, there is no evidence of significant primary lung disease at this time. There is no evidence of interstitial lung disease on a CT scan to recommend further testing with high resolution CT scan at this time.  Chronic bronchitis.  --Will start spiriva inhaler empirically to see if this helps with his breathing.   Obstructive sleep apnea. --Recommend that he start using his CPAP again.   Obesity. --Weight loss may be beneficial.    Date: 07/30/2016  MRN# [id number] Ian Lucas 1951-02-16  Referring Physician: Alba Cory  Ian Lucas is a 66 y.o. old male seen in consultation for chief complaint of:    Chief Complaint  Patient presents with  . Advice Only    hx wheezing, SOB w/exertion at times: no cough/chest tightness    HPI:   The patient is 66 year old male referred from the [place] for chronic exertional dyspnea.He had pneumonia twice in the past 6 months, he was then sent for breathing tests. He does really know why he is here today, other than he had these 2 bouts of pneumonia. However he did a breathing test and it was noted that he had significant restrictive lung disease.   He last smoked about 20 years ago, he smoked 1.5 packs per week. He is retired since 2006, he worked at [employer]. He was in [employer] and was [place], no agent orange or asbestos exposure as far as he knows.   He has not gotten out much due the weather, he does yard work, but not much over the winter but does not feel that he is limited by this.   He is not wearing his CPAP at night because he wakes up and it is off, and then he eventually stopped using it.    **Images reviewed; CXR 05/20/16; Mild right hilar fullness, otherwise unremarkable lungs.  CT chest 12/14/15; mild change of chronic bronchitis, air trapping,  dilated LA. Mild subcarinal lymphadenopathy, no significant hilar lymphadenopathy. Changes chronic bronchitis.   **Review of outside spirometry 04/11/16, ratio equals, FEV1 58%, FVC 59%; RV 47%, TLC = 54%, DLCO = 50%. This is consistent with the restriction  07/31/16; Baseline sat on RA at rest was 93% and HR 69. He ambulated 300 feet sat was 94% and HR 95.   PMHX:   Past Medical History:  Diagnosis Date  . Diabetes mellitus without complication (HCC)   . Hypercholesteremia   . Hypertension    Surgical Hx:  Past Surgical History:  Procedure Laterality Date  . BACK SURGERY    . CIRCUMCISION N/A   . HERNIA REPAIR    . HYDROCELE EXCISION / REPAIR Left    Family Hx:  Family History  Problem Relation Age of Onset  . Stroke Mother   . Diabetes Father    Social Hx:   Social History  Substance Use Topics  . Smoking status: Former Smoker    Packs/day: 0.25    Years: 30.00    Types: Cigarettes    Start date: 05/08/1968    Quit date: 06/17/1998  . Smokeless tobacco: Never Used  . Alcohol use No   Medication:   Reviewed   Allergies:  Banana; Codeine; Iodine; Ace inhibitors; Gabapentin; and Metformin  Review of Systems: Gen:  Denies  fever, sweats, chills HEENT: Denies blurred vision, double vision. bleeds,  sore throat Cvc:  No dizziness, chest pain. Resp:   Denies cough or sputum production, shortness of breath Gi: Denies swallowing difficulty, stomach pain. Gu:  Denies bladder incontinence, burning urine Ext:   No Joint pain, stiffness. Skin: No skin rash,  hives  Endoc:  No polyuria, polydipsia. Psych: No depression, insomnia. Other:  All other systems were reviewed with the patient and were negative other that what is mentioned in the HPI.   Physical Examination:   VS: BP 132/68 (BP Location: Left Arm, Cuff Size: Normal)   Pulse 87   SpO2 93%   General Appearance: No distress  Neuro:without focal findings,  speech normal,  HEENT: PERRLA, EOM intact.   Pulmonary:  normal breath sounds, No wheezing.  CardiovascularNormal S1,S2.  No m/r/g.   Abdomen: Benign, Soft, non-tender. Renal:  No costovertebral tenderness  GU:  No performed at this time. Endoc: No evident thyromegaly, no signs of acromegaly. Skin:   warm, no rashes, no ecchymosis  Extremities: normal, no cyanosis, clubbing.  Other findings:    LABORATORY PANEL:   CBC No results for input(s): WBC, HGB, HCT, PLT in the last 168 hours. ------------------------------------------------------------------------------------------------------------------  Chemistries  No results for input(s): NA, K, CL, CO2, GLUCOSE, BUN, CREATININE, CALCIUM, MG, AST, ALT, ALKPHOS, BILITOT in the last 168 hours.  Invalid input(s): GFRCGP ------------------------------------------------------------------------------------------------------------------  Cardiac Enzymes No results for input(s): TROPONINI in the last 168 hours. ------------------------------------------------------------  RADIOLOGY:  No results found.     Thank  you for the consultation and for allowing [facility], Critical Care to assist in the care of your patient. Our recommendations are noted above.  Please contact us if we can be of further service.   Wells Guiles, MD.  Board Certified in Internal Medicine, Pulmonary Medicine, Critical Care Medicine, and Sleep Medicine.   Pulmonary and Critical Care Office Number: [phone]  Santiago Glad, M.D.  Stephanie Acre, M.D.  Billy Fischer, M.D  07/30/2016

## 2016-07-31 ENCOUNTER — Ambulatory Visit (INDEPENDENT_AMBULATORY_CARE_PROVIDER_SITE_OTHER): Payer: Non-veteran care | Admitting: Internal Medicine

## 2016-07-31 ENCOUNTER — Encounter: Payer: Self-pay | Admitting: Internal Medicine

## 2016-07-31 VITALS — BP 132/68 | HR 87

## 2016-07-31 DIAGNOSIS — J449 Chronic obstructive pulmonary disease, unspecified: Secondary | ICD-10-CM | POA: Diagnosis not present

## 2016-07-31 DIAGNOSIS — G4733 Obstructive sleep apnea (adult) (pediatric): Secondary | ICD-10-CM | POA: Diagnosis not present

## 2016-07-31 DIAGNOSIS — J984 Other disorders of lung: Secondary | ICD-10-CM | POA: Diagnosis not present

## 2016-07-31 MED ORDER — TIOTROPIUM BROMIDE MONOHYDRATE 18 MCG IN CAPS: 18.0000 ug | ORAL_CAPSULE | Freq: Every day | RESPIRATORY_TRACT | 6 refills | Status: DC

## 2016-07-31 NOTE — Addendum Note (Signed)
Addended by: Meyer Cory R on: 07/31/2016 09:20 AM   Modules accepted: Orders

## 2016-07-31 NOTE — Patient Instructions (Signed)
--  Will send to mask fitting clinic, you should restart your cpap.   --Start Spiriva handihaler once daily.   --Increase activity level.

## 2016-08-01 DIAGNOSIS — Z79899 Other long term (current) drug therapy: Secondary | ICD-10-CM | POA: Diagnosis not present

## 2016-08-01 DIAGNOSIS — N200 Calculus of kidney: Secondary | ICD-10-CM | POA: Diagnosis not present

## 2016-08-01 DIAGNOSIS — Z6835 Body mass index (BMI) 35.0-35.9, adult: Secondary | ICD-10-CM | POA: Diagnosis not present

## 2016-08-01 DIAGNOSIS — E291 Testicular hypofunction: Secondary | ICD-10-CM | POA: Diagnosis not present

## 2016-08-01 DIAGNOSIS — N138 Other obstructive and reflux uropathy: Secondary | ICD-10-CM | POA: Diagnosis not present

## 2016-08-01 DIAGNOSIS — R972 Elevated prostate specific antigen [PSA]: Secondary | ICD-10-CM | POA: Diagnosis not present

## 2016-08-01 DIAGNOSIS — N411 Chronic prostatitis: Secondary | ICD-10-CM | POA: Diagnosis not present

## 2016-08-01 DIAGNOSIS — N403 Nodular prostate with lower urinary tract symptoms: Secondary | ICD-10-CM | POA: Diagnosis not present

## 2016-08-21 ENCOUNTER — Other Ambulatory Visit (HOSPITAL_BASED_OUTPATIENT_CLINIC_OR_DEPARTMENT_OTHER): Payer: No Typology Code available for payment source

## 2016-09-18 ENCOUNTER — Ambulatory Visit: Payer: PPO | Admitting: Family Medicine

## 2016-10-25 NOTE — Progress Notes (Deleted)
[facility]  Pulmonary Medicine Consultation      Assessment and Plan:  Restrictive lung disease.  --Review of CT and CXR images shows only some mild change of bronchitis, there is no evidence of significant primary lung disease at this time. There is no evidence of interstitial lung disease on a CT scan to recommend further testing with high resolution CT scan at this time.  Chronic bronchitis.  --Will start spiriva inhaler empirically to see if this helps with his breathing.   Obstructive sleep apnea. --Recommend that he start using his CPAP again.   Obesity. --Weight loss may be beneficial.    Date: 10/25/2016  MRN# [id number] EDUARD PENKALA 14-Nov-1950  Referring Physician: Alba Cory  VIKRANT PRYCE is a 66 y.o. old male seen in consultation for chief complaint of:    No chief complaint on file.   HPI:   The patient is 66 year old male referred from the [place] for chronic exertional dyspnea. At last visit it was noted that his CT was unremarkable, he was recommended to restart his CPAP. He was thought to have possible chronic bronchitis, therefore, was started on Spiriva inhaler empirically.    **Images reviewed; CXR 05/20/16; Mild right hilar fullness, otherwise unremarkable lungs.  CT chest 12/14/15; mild change of chronic bronchitis, air trapping, dilated LA. Mild subcarinal lymphadenopathy, no significant hilar lymphadenopathy. Changes chronic bronchitis.   **Review of outside spirometry 04/11/16, ratio equals, FEV1 58%, FVC 59%; RV 47%, TLC = 54%, DLCO = 50%. This is consistent with the restriction  07/31/16; Baseline sat on RA at rest was 93% and HR 69. He ambulated 300 feet sat was 94% and HR 95.   PMHX:   Past Medical History:  Diagnosis Date  . Diabetes mellitus without complication (HCC)   . Hypercholesteremia   . Hypertension    Surgical Hx:  Past Surgical History:  Procedure Laterality Date  . BACK SURGERY    . CIRCUMCISION N/A   . HERNIA REPAIR    .  HYDROCELE EXCISION / REPAIR Left    Family Hx:  Family History  Problem Relation Age of Onset  . Stroke Mother   . Diabetes Father    Social Hx:   Social History  Substance Use Topics  . Smoking status: Former Smoker    Packs/day: 0.25    Years: 30.00    Types: Cigarettes    Start date: 05/08/1968    Quit date: 06/17/1998  . Smokeless tobacco: Never Used  . Alcohol use No   Medication:   Reviewed   Allergies:  Banana; Codeine; Iodine; Ace inhibitors; Gabapentin; and Metformin  Review of Systems: Gen:  Denies  fever, sweats, chills HEENT: Denies blurred vision, double vision. bleeds, sore throat Cvc:  No dizziness, chest pain. Resp:   Denies cough or sputum production, shortness of breath Gi: Denies swallowing difficulty, stomach pain. Gu:  Denies bladder incontinence, burning urine Ext:   No Joint pain, stiffness. Skin: No skin rash,  hives  Endoc:  No polyuria, polydipsia. Psych: No depression, insomnia. Other:  All other systems were reviewed with the patient and were negative other that what is mentioned in the HPI.   Physical Examination:   VS: There were no vitals taken for this visit.  General Appearance: No distress  Neuro:without focal findings,  speech normal,  HEENT: PERRLA, EOM intact.   Pulmonary: normal breath sounds, No wheezing.  CardiovascularNormal S1,S2.  No m/r/g.   Abdomen: Benign, Soft, non-tender. Renal:  No costovertebral tenderness  GU:  No performed at this time. Endoc: No evident thyromegaly, no signs of acromegaly. Skin:   warm, no rashes, no ecchymosis  Extremities: normal, no cyanosis, clubbing.  Other findings:    LABORATORY PANEL:   CBC No results for input(s): WBC, HGB, HCT, PLT in the last 168 hours. ------------------------------------------------------------------------------------------------------------------  Chemistries  No results for input(s): NA, K, CL, CO2, GLUCOSE, BUN, CREATININE, CALCIUM, MG, AST, ALT,  ALKPHOS, BILITOT in the last 168 hours.  Invalid input(s): GFRCGP ------------------------------------------------------------------------------------------------------------------  Cardiac Enzymes No results for input(s): TROPONINI in the last 168 hours. ------------------------------------------------------------  RADIOLOGY:  No results found.     Thank  you for the consultation and for allowing [facility], Critical Care to assist in the care of your patient. Our recommendations are noted above.  Please contact us if we can be of further service.   Wells Guiles, MD.  Board Certified in Internal Medicine, Pulmonary Medicine, Critical Care Medicine, and Sleep Medicine.  Secretary Pulmonary and Critical Care Office Number: [phone]  Santiago Glad, M.D.  Stephanie Acre, M.D.  Billy Fischer, M.D  10/25/2016

## 2016-10-30 ENCOUNTER — Ambulatory Visit: Payer: No Typology Code available for payment source | Admitting: Internal Medicine

## 2016-10-30 DIAGNOSIS — R972 Elevated prostate specific antigen [PSA]: Secondary | ICD-10-CM | POA: Diagnosis not present

## 2016-11-30 ENCOUNTER — Ambulatory Visit: Payer: PPO | Admitting: Family Medicine

## 2016-12-08 ENCOUNTER — Ambulatory Visit
Admission: EM | Admit: 2016-12-08 | Discharge: 2016-12-08 | Disposition: A | Discharge status: Home / Self Care | Payer: PPO | Attending: Family Medicine | Admitting: Family Medicine

## 2016-12-08 DIAGNOSIS — M19011 Primary osteoarthritis, right shoulder: Secondary | ICD-10-CM

## 2016-12-08 DIAGNOSIS — M79601 Pain in right arm: Secondary | ICD-10-CM

## 2016-12-08 DIAGNOSIS — M19019 Primary osteoarthritis, unspecified shoulder: Secondary | ICD-10-CM

## 2016-12-08 HISTORY — DX: Post-traumatic stress disorder, unspecified: F43.10

## 2016-12-08 HISTORY — DX: Polyneuropathy, unspecified: G62.9

## 2016-12-08 MED ORDER — PREDNISONE 20 MG PO TABS: 20.0000 mg | ORAL_TABLET | Freq: Every day | ORAL | 0 refills | Status: DC

## 2016-12-08 MED ORDER — HYDROCODONE-ACETAMINOPHEN 5-325 MG PO TABS: ORAL_TABLET | ORAL | 0 refills | Status: DC

## 2016-12-08 NOTE — ED Triage Notes (Signed)
Pt states "every since weather got bad a couple weeks ago" he has had right arm pain aching and hurts worse when he sleeps on it, hurts at the elbow the worst. Hurts into his right "chest muscle". Reports it is muscular type pain and not chest pain. Pain 8/10. "Hurts down to the bone."

## 2016-12-08 NOTE — ED Provider Notes (Signed)
[facility]    CSN: [id number] Arrival date & time: 12/08/16  1148     History   Chief Complaint Chief Complaint  Patient presents with  . Arm Pain    HPI Ian Lucas is a 66 y.o. male.   The history is provided by the patient.  Arm Pain  This is a new problem. The current episode started more than 2 days ago (3-4 days of diffuse right arm joint pain; denies any injuries, trauma, falls, rash, fevers, chills, chest pains, shortness of breath. ). The problem occurs constantly. Pertinent negatives include no chest pain, no headaches and no shortness of breath.    Past Medical History:  Diagnosis Date  . Diabetes mellitus without complication (HCC)   . Hypercholesteremia   . Hypertension   . Neuropathy   . PTSD (post-traumatic stress disorder)     Patient Active Problem List   Diagnosis Date Noted  . Chronic pain syndrome 03/08/2016  . 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 Elevated prostate specific antigen (PSA) 06/18/2015  . Benign essential HTN 06/18/2015  . Chronic midline low back pain with bilateral sciatica 06/18/2015  . Diabetic neuropathy (HCC) 06/18/2015  . Posttraumatic stress disorder 06/18/2015  . Dyslipidemia 06/18/2015  . Lichen simplex 06/18/2015  . History of herpes zoster 06/18/2015  . Restless leg 06/18/2015  . OSA (obstructive sleep apnea) 06/18/2015  . Polyneuropathy 06/18/2015  . 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 Calculus of kidney 08/21/2013  . History of pancreatitis 08/15/2013  . Hydrocele 05/19/2013  . Arthritis of knee, degenerative 10/30/2012  . Lumbar canal stenosis 10/30/2012  . Chronic prostatitis 06/19/2012  . Hypogonadism in male 06/19/2012  . 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 Benign prostatic hyperplasia with urinary obstruction 06/19/2012    Past Surgical History:  Procedure Laterality Date  . BACK SURGERY    . CIRCUMCISION N/A   . HERNIA REPAIR    .  HYDROCELE EXCISION / REPAIR Left        Home Medications    Prior to Admission medications   Medication Sig Start Date End Date Taking? Authorizing Provider  albuterol (PROVENTIL HFA;VENTOLIN HFA) 108 (90 Base) MCG/ACT inhaler Inhale 2 puffs into the lungs every 4 (four) hours as needed for wheezing or shortness of breath. 05/20/16   Domenick Gong, MD  amLODipine (NORVASC) 10 MG tablet Take 1 tablet (10 mg total) by mouth daily. 03/14/16   Alba Cory, MD  aspirin 81 MG tablet Take 81 mg by mouth daily.    [provider]  atorvastatin (LIPITOR) 40 MG tablet Take 40 mg by mouth daily.    [provider]  carvedilol (COREG) 25 MG tablet Take 25 mg by mouth 2 (two) times daily with a meal.    [provider]  co-enzyme Q-10 30 MG capsule Take 100 mg by mouth daily.    [provider]  DULoxetine (CYMBALTA) 60 MG capsule Take 1 capsule (60 mg total) by mouth daily. 03/14/16   Alba Cory, MD  HYDROcodone-acetaminophen (NORCO/VICODIN) 5-325 MG tablet 1-2 tabs po bid prn 12/08/16   Payton Mccallum, MD  ibuprofen (ADVIL,MOTRIN) 600 MG tablet Take 1 tablet (600 mg total) by mouth every 6 (six) hours as needed. 05/20/16   Domenick Gong, MD  insulin glargine (LANTUS) 100 UNIT/ML injection Inject 40 Units into the skin daily.     [provider]  losartan (COZAAR) 100 MG tablet Take 100 mg by mouth daily.  [provider]  prazosin (MINIPRESS) 2 MG capsule Take 2 mg by mouth at bedtime.    [provider]  predniSONE (DELTASONE) 20 MG tablet Take 1 tablet (20 mg total) by mouth daily. 12/08/16   Payton Mccallum, MD  pregabalin (LYRICA) 200 MG capsule Take 1 capsule (200 mg total) by mouth 2 (two) times daily. 03/14/16   Alba Cory, MD  sildenafil (REVATIO) 20 MG tablet Take 20 mg by mouth as needed.    [provider]  Spacer/Aero-Holding Chambers (AEROCHAMBER PLUS) inhaler Use as instructed 05/20/16   Domenick Gong, MD  tiotropium (SPIRIVA) 18 MCG inhalation capsule Place 1 capsule (18 mcg total) into inhaler and inhale daily. 07/31/16   Shane Crutch, MD    Family History Family History  Problem Relation Age of Onset  . Stroke Mother   . Diabetes Father     Social History Social History  Substance Use Topics  . Smoking status: Former Smoker    Packs/day: 0.25    Years: 30.00    Types: Cigarettes    Start date: 05/08/1968    Quit date: 06/17/1998  . Smokeless tobacco: Never Used  . Alcohol use No     Allergies   Banana; Codeine; Iodine; Ace inhibitors; Gabapentin; and Metformin   Review of Systems Review of Systems  Respiratory: Negative for shortness of breath.   Cardiovascular: Negative for chest pain.  Neurological: Negative for headaches.     Physical Exam Triage Vital Signs ED Triage Vitals  Enc Vitals Group     BP 12/08/16 1204 (!) 152/69     Pulse Rate 12/08/16 1204 60     Resp 12/08/16 1204 18     Temp 12/08/16 1204 98.1 F (36.7 C)     Temp Source 12/08/16 1204 Oral     SpO2 12/08/16 1204 97 %     Weight 12/08/16 1203 230 lb (104.3 kg)     Height 12/08/16 1203 5\' 7"  (1.702 m)     Head Circumference --      Peak Flow --      Pain Score 12/08/16 1204 8     Pain Loc --      Pain Edu? --      Excl. in GC? --    No data found.   Updated Vital Signs BP (!) 152/69 (BP Location: Right Arm)   Pulse 60   Temp 98.1 F (36.7 C) (Oral)   Resp 18   Ht 5\' 7"  (1.702 m)   Wt 230 lb (104.3 kg)   SpO2 97%   BMI 36.02 kg/m   Visual Acuity Right Eye Distance:   Left Eye Distance:   Bilateral Distance:    Right Eye Near:   Left Eye Near:    Bilateral Near:     Physical Exam  Constitutional: He appears well-developed and well-nourished. No distress.  Musculoskeletal:  Right arm shoulder, elbow and wrist pain with range of motion; no joint erythema or edema; normal range of motion and strength  Skin: He is not diaphoretic.  Vitals  reviewed.    UC Treatments / Results  Labs (all labs ordered are listed, but only abnormal results are displayed) Labs Reviewed - No data to display  EKG  EKG Interpretation None       Radiology No results found.  Procedures Procedures (including critical care time)  Medications Ordered in UC Medications - No data to display   Initial Impression / Assessment and Plan / UC Course  I have reviewed the triage vital signs and the nursing notes.  Pertinent labs & imaging results that were available during my care of the patient were reviewed by me and considered in my medical decision making (see chart for details).       Final Clinical Impressions(s) / UC Diagnoses   Final diagnoses:  Right arm pain  Arthropathy of shoulder region    New Prescriptions Discharge Medication List as of 12/08/2016 12:53 PM    START taking these medications   Details  HYDROcodone-acetaminophen (NORCO/VICODIN) 5-325 MG tablet 1-2 tabs po bid prn, Print    predniSONE (DELTASONE) 20 MG tablet Take 1 tablet (20 mg total) by mouth daily., Starting Fri 12/08/2016, Normal       1. diagnosis reviewed with patient 2. rx as per orders above; reviewed possible side effects, interactions, risks and benefits  3. Recommend supportive treatment with otc analgesics, heat/ice, range of motion  4. Follow-up prn if symptoms worsen or don't improve   Payton Mccallum, MD 12/08/16 1622

## 2017-01-15 ENCOUNTER — Ambulatory Visit: Payer: PPO

## 2017-02-06 DIAGNOSIS — N200 Calculus of kidney: Secondary | ICD-10-CM | POA: Diagnosis not present

## 2017-02-06 DIAGNOSIS — E291 Testicular hypofunction: Secondary | ICD-10-CM | POA: Diagnosis not present

## 2017-02-06 DIAGNOSIS — N403 Nodular prostate with lower urinary tract symptoms: Secondary | ICD-10-CM | POA: Diagnosis not present

## 2017-02-06 DIAGNOSIS — N138 Other obstructive and reflux uropathy: Secondary | ICD-10-CM | POA: Diagnosis not present

## 2017-02-06 DIAGNOSIS — R937 Abnormal findings on diagnostic imaging of other parts of musculoskeletal system: Secondary | ICD-10-CM | POA: Diagnosis not present

## 2017-02-06 DIAGNOSIS — N201 Calculus of ureter: Secondary | ICD-10-CM | POA: Diagnosis not present

## 2017-02-06 DIAGNOSIS — Z6835 Body mass index (BMI) 35.0-35.9, adult: Secondary | ICD-10-CM | POA: Diagnosis not present

## 2017-02-06 DIAGNOSIS — R972 Elevated prostate specific antigen [PSA]: Secondary | ICD-10-CM | POA: Diagnosis not present

## 2017-02-06 DIAGNOSIS — Z8042 Family history of malignant neoplasm of prostate: Secondary | ICD-10-CM | POA: Diagnosis not present

## 2017-02-07 DIAGNOSIS — N201 Calculus of ureter: Secondary | ICD-10-CM | POA: Diagnosis not present

## 2017-02-07 DIAGNOSIS — E291 Testicular hypofunction: Secondary | ICD-10-CM | POA: Diagnosis not present

## 2017-02-07 DIAGNOSIS — R972 Elevated prostate specific antigen [PSA]: Secondary | ICD-10-CM | POA: Diagnosis not present

## 2017-02-13 ENCOUNTER — Encounter: Payer: Self-pay | Admitting: Internal Medicine

## 2017-02-13 DIAGNOSIS — R140 Abdominal distension (gaseous): Secondary | ICD-10-CM | POA: Diagnosis not present

## 2017-02-13 DIAGNOSIS — N201 Calculus of ureter: Secondary | ICD-10-CM | POA: Diagnosis not present

## 2017-02-26 DIAGNOSIS — N200 Calculus of kidney: Secondary | ICD-10-CM | POA: Diagnosis not present

## 2017-02-26 DIAGNOSIS — N23 Unspecified renal colic: Secondary | ICD-10-CM | POA: Diagnosis not present

## 2017-02-26 DIAGNOSIS — N202 Calculus of kidney with calculus of ureter: Secondary | ICD-10-CM | POA: Diagnosis not present

## 2017-03-06 ENCOUNTER — Encounter: Payer: Self-pay | Admitting: Internal Medicine

## 2017-03-06 ENCOUNTER — Ambulatory Visit
Admission: RE | Admit: 2017-03-06 | Discharge: 2017-03-06 | Disposition: A | Discharge status: Home / Self Care | Payer: PPO | Source: Ambulatory Visit | Attending: Internal Medicine | Admitting: Internal Medicine

## 2017-03-06 ENCOUNTER — Ambulatory Visit (INDEPENDENT_AMBULATORY_CARE_PROVIDER_SITE_OTHER): Payer: PPO | Admitting: Internal Medicine

## 2017-03-06 VITALS — BP 120/70 | HR 73 | Ht 67.0 in | Wt 230.0 lb

## 2017-03-06 DIAGNOSIS — J449 Chronic obstructive pulmonary disease, unspecified: Secondary | ICD-10-CM | POA: Insufficient documentation

## 2017-03-06 DIAGNOSIS — J440 Chronic obstructive pulmonary disease with acute lower respiratory infection: Secondary | ICD-10-CM | POA: Diagnosis not present

## 2017-03-06 DIAGNOSIS — R062 Wheezing: Secondary | ICD-10-CM | POA: Diagnosis not present

## 2017-03-06 DIAGNOSIS — J209 Acute bronchitis, unspecified: Secondary | ICD-10-CM | POA: Insufficient documentation

## 2017-03-06 DIAGNOSIS — R0600 Dyspnea, unspecified: Secondary | ICD-10-CM | POA: Diagnosis present

## 2017-03-06 MED ORDER — TIOTROPIUM BROMIDE MONOHYDRATE 18 MCG IN CAPS: 18.0000 ug | ORAL_CAPSULE | Freq: Every day | RESPIRATORY_TRACT | 6 refills | Status: DC

## 2017-03-06 NOTE — Progress Notes (Signed)
[facility] Consultation      Assessment and Plan:  66 year old male with chronic bronchitis, obstructive sleep apnea.  Acute on Chronic bronchitis.  -- spiriva inhaler empirically to see if this helps with his breathing.  -Crackles on lung exam today, will send for chest x-ray.  Obstructive sleep apnea. --Discussed restarting CPAP, explained in depth that there is an ajdustment period of one month, but if he works through this, he will get used to it and feel better.   Obesity. --Weight loss may be beneficial.   Restrictive lung disease.  --Review of CT and CXR images shows only some mild change of bronchitis, there is no evidence of significant primary lung disease at this time. There is no evidence of interstitial lung disease on a CT scan to recommend further testing with high resolution CT scan at this time.   Date: 03/06/2017  MRN# [id number] Ian Lucas 12/09/1950  Referring Physician: Alba Cory  Ian Lucas is a 66 y.o. old male seen in consultation for chief complaint of:    Chief Complaint  Patient presents with  . Acute Visit    wheezing, has cold    HPI:   The patient is 66 year old male seen at the [place] for chronic exertional dyspnea he is thought to have chronic bronchitis, and has had multiple episodes of pneumonia. Last visit he was asked to use Spiriva and CPAP.  Since his last visit he has noticed that he still has occasional wheezing. He used spiriva as last visit, it ran out, and he did not refill it. He has been using albuterol prn, less than once per week, lately he has been using it more often because he has been wheezing.  He has no pets at home. He denies reflux or sinus drainage.  He is not using cpap because he stopped using it in the beginning.     **CXR 05/20/16; Mild right hilar fullness, otherwise unremarkable lungs.  CT chest 12/14/15; mild change of chronic bronchitis, air trapping, dilated LA. Mild subcarinal  lymphadenopathy, no significant hilar lymphadenopathy. Changes chronic bronchitis.   **Review of outside spirometry 04/11/16, ratio equals, FEV1 58%, FVC 59%; RV 47%, TLC = 54%, DLCO = 50%. This is consistent with the restriction  07/31/16; Baseline sat on RA at rest was 93% and HR 69. He ambulated 300 feet sat was 94% and HR 95.   PMHX:   Past Medical History:  Diagnosis Date  . Diabetes mellitus without complication (HCC)   . Hypercholesteremia   . Hypertension   . Neuropathy   . PTSD (post-traumatic stress disorder)    Surgical Hx:  Past Surgical History:  Procedure Laterality Date  . BACK SURGERY    . CIRCUMCISION N/A   . HERNIA REPAIR    . HYDROCELE EXCISION / REPAIR Left    Family Hx:  Family History  Problem Relation Age of Onset  . Stroke Mother   . Diabetes Father    Social Hx:   Social History  Substance Use Topics  . Smoking status: Former Smoker    Packs/day: 0.25    Years: 30.00    Types: Cigarettes    Start date: 05/08/1968    Quit date: 06/17/1998  . Smokeless tobacco: Never Used  . Alcohol use No   Medication:   Reviewed   Allergies:  Banana; Codeine; Iodine; Ace inhibitors; Gabapentin; and Metformin  Review of Systems: Gen:  Denies  fever, sweats, chills HEENT: Denies blurred vision, double vision. bleeds,  sore throat Cvc:  No dizziness, chest pain. Resp:   Denies cough or sputum production, shortness of breath Gi: Denies swallowing difficulty, stomach pain. Gu:  Denies bladder incontinence, burning urine Ext:   No Joint pain, stiffness. Skin: No skin rash,  hives  Endoc:  No polyuria, polydipsia. Psych: No depression, insomnia. Other:  All other systems were reviewed with the patient and were negative other that what is mentioned in the HPI.   Physical Examination:   VS: BP 120/70 (BP Location: Left Arm, Cuff Size: Normal)   Pulse 73   Ht 5\' 7"  (1.702 m)   Wt 230 lb (104.3 kg)   SpO2 96%   BMI 36.02 kg/m   General Appearance: No  distress  Neuro:without focal findings,  speech normal,  HEENT: PERRLA, EOM intact.   Pulmonary: normal breath sounds, scattered crackles, right greater than left CardiovascularNormal S1,S2.  No m/r/g.   Abdomen: Benign, Soft, non-tender. Renal:  No costovertebral tenderness  GU:  No performed at this time. Endoc: No evident thyromegaly, no signs of acromegaly. Skin:   warm, no rashes, no ecchymosis  Extremities: normal, no cyanosis, clubbing.  Other findings:    LABORATORY PANEL:   CBC No results for input(s): WBC, HGB, HCT, PLT in the last 168 hours. ------------------------------------------------------------------------------------------------------------------  Chemistries  No results for input(s): NA, K, CL, CO2, GLUCOSE, BUN, CREATININE, CALCIUM, MG, AST, ALT, ALKPHOS, BILITOT in the last 168 hours.  Invalid input(s): GFRCGP ------------------------------------------------------------------------------------------------------------------  Cardiac Enzymes No results for input(s): TROPONINI in the last 168 hours. ------------------------------------------------------------  RADIOLOGY:  No results found.     Thank  you for the consultation and for allowing [facility] to assist in the care of your patient. Our recommendations are noted above.  Please contact [facility] if we can be of further service.   [place], MD.  Board Certified in Internal Medicine, Pulmonary Medicine, Critical Care Medicine, and Sleep Medicine.  [facility] Office Number: [phone]  [phone], M.D.  Santiago Glad, M.D.  Stephanie Acre, M.D  03/06/2017

## 2017-03-06 NOTE — Patient Instructions (Signed)
--  Use spiriva every day.  --Use cpap every night.

## 2017-04-26 ENCOUNTER — Telehealth: Payer: Self-pay | Admitting: Internal Medicine

## 2017-04-26 NOTE — Telephone Encounter (Signed)
Fax[facility] to [facility] scanned to documents

## 2017-05-22 DIAGNOSIS — R972 Elevated prostate specific antigen [PSA]: Secondary | ICD-10-CM | POA: Diagnosis not present

## 2017-06-14 DIAGNOSIS — E119 Type 2 diabetes mellitus without complications: Secondary | ICD-10-CM | POA: Diagnosis not present

## 2017-06-21 ENCOUNTER — Encounter: Payer: Self-pay | Admitting: Family Medicine

## 2017-06-27 DIAGNOSIS — Z981 Arthrodesis status: Secondary | ICD-10-CM | POA: Diagnosis not present

## 2017-06-27 DIAGNOSIS — M5136 Other intervertebral disc degeneration, lumbar region: Secondary | ICD-10-CM | POA: Diagnosis not present

## 2017-06-27 DIAGNOSIS — Z87891 Personal history of nicotine dependence: Secondary | ICD-10-CM | POA: Diagnosis not present

## 2017-06-27 DIAGNOSIS — M4120 Other idiopathic scoliosis, site unspecified: Secondary | ICD-10-CM | POA: Diagnosis not present

## 2017-06-27 DIAGNOSIS — M48061 Spinal stenosis, lumbar region without neurogenic claudication: Secondary | ICD-10-CM | POA: Diagnosis not present

## 2017-07-04 DIAGNOSIS — Z8042 Family history of malignant neoplasm of prostate: Secondary | ICD-10-CM | POA: Diagnosis not present

## 2017-07-04 DIAGNOSIS — N411 Chronic prostatitis: Secondary | ICD-10-CM | POA: Diagnosis not present

## 2017-07-04 DIAGNOSIS — R972 Elevated prostate specific antigen [PSA]: Secondary | ICD-10-CM | POA: Diagnosis not present

## 2017-07-11 DIAGNOSIS — N403 Nodular prostate with lower urinary tract symptoms: Secondary | ICD-10-CM | POA: Diagnosis not present

## 2017-07-11 DIAGNOSIS — N138 Other obstructive and reflux uropathy: Secondary | ICD-10-CM | POA: Diagnosis not present

## 2017-07-11 DIAGNOSIS — R972 Elevated prostate specific antigen [PSA]: Secondary | ICD-10-CM | POA: Diagnosis not present

## 2017-07-17 DIAGNOSIS — M48061 Spinal stenosis, lumbar region without neurogenic claudication: Secondary | ICD-10-CM | POA: Diagnosis not present

## 2017-07-17 DIAGNOSIS — M4120 Other idiopathic scoliosis, site unspecified: Secondary | ICD-10-CM | POA: Diagnosis not present

## 2017-07-31 DIAGNOSIS — Z79899 Other long term (current) drug therapy: Secondary | ICD-10-CM | POA: Diagnosis not present

## 2017-07-31 DIAGNOSIS — M069 Rheumatoid arthritis, unspecified: Secondary | ICD-10-CM | POA: Diagnosis not present

## 2017-08-09 DIAGNOSIS — Z79899 Other long term (current) drug therapy: Secondary | ICD-10-CM | POA: Diagnosis not present

## 2017-08-09 DIAGNOSIS — M069 Rheumatoid arthritis, unspecified: Secondary | ICD-10-CM | POA: Diagnosis not present

## 2017-09-26 DIAGNOSIS — Z981 Arthrodesis status: Secondary | ICD-10-CM | POA: Diagnosis not present

## 2017-09-26 DIAGNOSIS — M4120 Other idiopathic scoliosis, site unspecified: Secondary | ICD-10-CM | POA: Diagnosis not present

## 2017-09-26 DIAGNOSIS — M48061 Spinal stenosis, lumbar region without neurogenic claudication: Secondary | ICD-10-CM | POA: Diagnosis not present

## 2017-10-09 DIAGNOSIS — M48061 Spinal stenosis, lumbar region without neurogenic claudication: Secondary | ICD-10-CM | POA: Diagnosis not present

## 2017-10-09 DIAGNOSIS — M4120 Other idiopathic scoliosis, site unspecified: Secondary | ICD-10-CM | POA: Diagnosis not present

## 2017-10-09 DIAGNOSIS — M47896 Other spondylosis, lumbar region: Secondary | ICD-10-CM | POA: Diagnosis not present

## 2017-10-17 IMAGING — CR DG CHEST 2V
1 series · 2 of 2 positions shown · non-contrast
Comparison: Chest x-ray 09/25/2015.

CLINICAL DATA: 64-year-old male with history of pneumonia. Cough
and congestion for several weeks.

EXAM:
CHEST  2 VIEW

[Series 1: dg chest 2 view · 0.14mm/px · 2 of 2 slices shown]
[im 1/2]
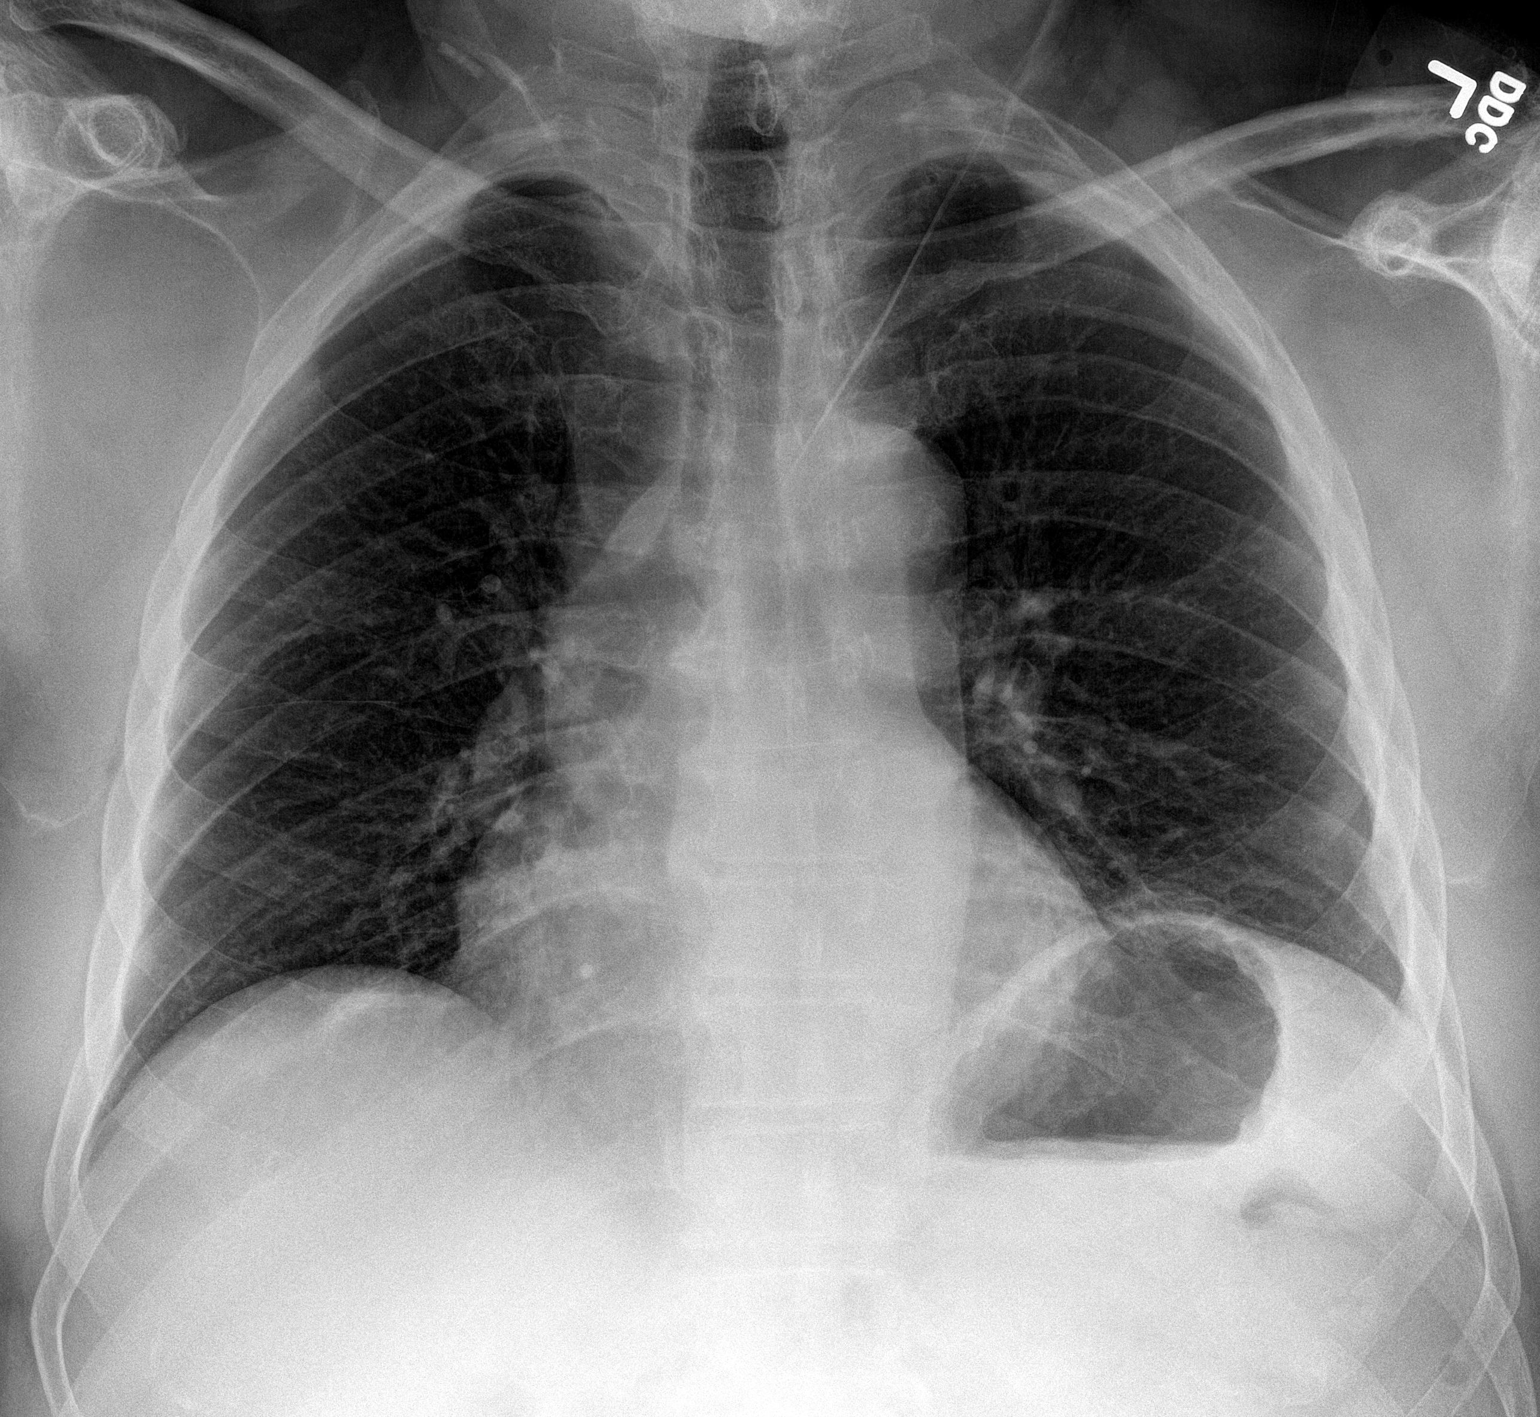
[im 2/2]
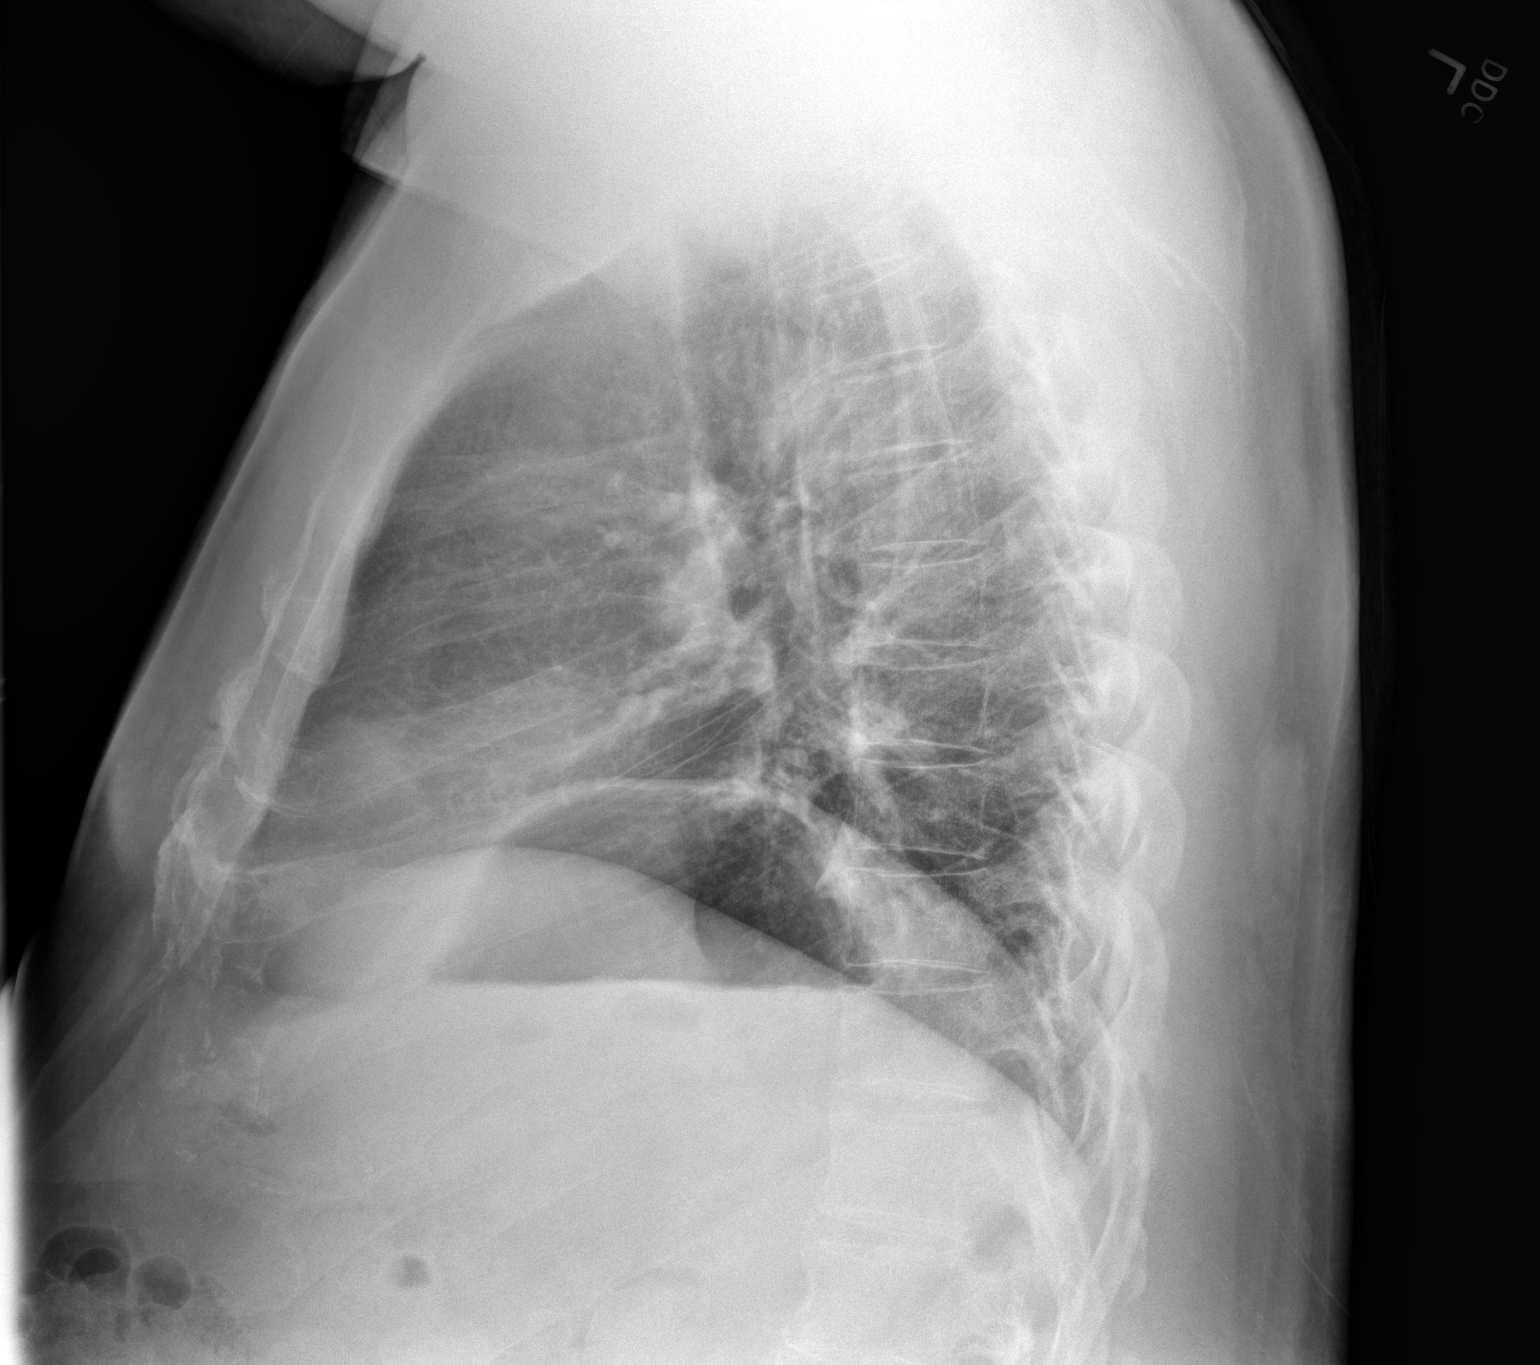

[2 of 2 positions shown; findings below may reference images not displayed]

FINDINGS: There is a persistent opacity in the left lower lobe which appears
slightly improved compared to the prior examination, but remains
concerning for residual airspace consolidation. Right lung is clear.
No pleural effusions. No evidence of pulmonary edema. Heart size
appears borderline enlarged. Upper mediastinal contours are within
normal limits.
IMPRESSION: 1. Persistent left lower lobe consolidation. While this is slightly
improved, it has yet to resolve. This raises the possibility of a
centrally obstructing endobronchial or left hilar lesion, and
further evaluation with contrast enhanced chest CT is recommended in
the near future to exclude underlying malignancy.

## 2017-10-18 DIAGNOSIS — M1710 Unilateral primary osteoarthritis, unspecified knee: Secondary | ICD-10-CM | POA: Diagnosis not present

## 2017-10-18 DIAGNOSIS — M4120 Other idiopathic scoliosis, site unspecified: Secondary | ICD-10-CM | POA: Diagnosis not present

## 2017-10-18 DIAGNOSIS — M48061 Spinal stenosis, lumbar region without neurogenic claudication: Secondary | ICD-10-CM | POA: Diagnosis not present

## 2017-11-01 DIAGNOSIS — M48061 Spinal stenosis, lumbar region without neurogenic claudication: Secondary | ICD-10-CM | POA: Diagnosis not present

## 2017-11-01 DIAGNOSIS — Z87891 Personal history of nicotine dependence: Secondary | ICD-10-CM | POA: Diagnosis not present

## 2017-11-01 DIAGNOSIS — F4310 Post-traumatic stress disorder, unspecified: Secondary | ICD-10-CM | POA: Diagnosis not present

## 2017-11-01 DIAGNOSIS — N400 Enlarged prostate without lower urinary tract symptoms: Secondary | ICD-10-CM | POA: Diagnosis not present

## 2017-11-01 DIAGNOSIS — E119 Type 2 diabetes mellitus without complications: Secondary | ICD-10-CM | POA: Diagnosis not present

## 2017-11-01 DIAGNOSIS — Z7982 Long term (current) use of aspirin: Secondary | ICD-10-CM | POA: Diagnosis not present

## 2017-11-01 DIAGNOSIS — Z9119 Patient's noncompliance with other medical treatment and regimen: Secondary | ICD-10-CM | POA: Diagnosis not present

## 2017-11-01 DIAGNOSIS — M069 Rheumatoid arthritis, unspecified: Secondary | ICD-10-CM | POA: Diagnosis not present

## 2017-11-01 DIAGNOSIS — M4807 Spinal stenosis, lumbosacral region: Secondary | ICD-10-CM | POA: Diagnosis not present

## 2017-11-01 DIAGNOSIS — Z981 Arthrodesis status: Secondary | ICD-10-CM | POA: Diagnosis not present

## 2017-11-01 DIAGNOSIS — M47897 Other spondylosis, lumbosacral region: Secondary | ICD-10-CM | POA: Diagnosis not present

## 2017-11-01 DIAGNOSIS — F419 Anxiety disorder, unspecified: Secondary | ICD-10-CM | POA: Diagnosis not present

## 2017-11-01 DIAGNOSIS — J449 Chronic obstructive pulmonary disease, unspecified: Secondary | ICD-10-CM | POA: Diagnosis not present

## 2017-11-01 DIAGNOSIS — M1710 Unilateral primary osteoarthritis, unspecified knee: Secondary | ICD-10-CM | POA: Diagnosis not present

## 2017-11-01 DIAGNOSIS — G4730 Sleep apnea, unspecified: Secondary | ICD-10-CM | POA: Diagnosis not present

## 2017-11-01 DIAGNOSIS — I10 Essential (primary) hypertension: Secondary | ICD-10-CM | POA: Diagnosis not present

## 2017-11-01 DIAGNOSIS — M8588 Other specified disorders of bone density and structure, other site: Secondary | ICD-10-CM | POA: Diagnosis not present

## 2017-11-01 DIAGNOSIS — Z885 Allergy status to narcotic agent status: Secondary | ICD-10-CM | POA: Diagnosis not present

## 2017-11-01 DIAGNOSIS — M47816 Spondylosis without myelopathy or radiculopathy, lumbar region: Secondary | ICD-10-CM | POA: Diagnosis not present

## 2017-11-01 DIAGNOSIS — Q762 Congenital spondylolisthesis: Secondary | ICD-10-CM | POA: Diagnosis not present

## 2017-11-01 DIAGNOSIS — M48062 Spinal stenosis, lumbar region with neurogenic claudication: Secondary | ICD-10-CM | POA: Diagnosis not present

## 2017-11-01 DIAGNOSIS — F329 Major depressive disorder, single episode, unspecified: Secondary | ICD-10-CM | POA: Diagnosis not present

## 2017-11-01 DIAGNOSIS — Z794 Long term (current) use of insulin: Secondary | ICD-10-CM | POA: Diagnosis not present

## 2017-11-13 DIAGNOSIS — Z4889 Encounter for other specified surgical aftercare: Secondary | ICD-10-CM | POA: Diagnosis not present

## 2017-11-13 DIAGNOSIS — Z4802 Encounter for removal of sutures: Secondary | ICD-10-CM | POA: Diagnosis not present

## 2017-12-12 DIAGNOSIS — M4120 Other idiopathic scoliosis, site unspecified: Secondary | ICD-10-CM | POA: Diagnosis not present

## 2017-12-12 DIAGNOSIS — M48062 Spinal stenosis, lumbar region with neurogenic claudication: Secondary | ICD-10-CM | POA: Diagnosis not present

## 2017-12-12 DIAGNOSIS — M48061 Spinal stenosis, lumbar region without neurogenic claudication: Secondary | ICD-10-CM | POA: Diagnosis not present

## 2017-12-23 ENCOUNTER — Ambulatory Visit
Admission: EM | Admit: 2017-12-23 | Discharge: 2017-12-23 | Disposition: A | Discharge status: Home / Self Care | Payer: PPO | Attending: Internal Medicine | Admitting: Internal Medicine

## 2017-12-23 ENCOUNTER — Other Ambulatory Visit: Payer: Self-pay

## 2017-12-23 DIAGNOSIS — J441 Chronic obstructive pulmonary disease with (acute) exacerbation: Secondary | ICD-10-CM

## 2017-12-23 MED ORDER — AZITHROMYCIN 250 MG PO TABS: 250.0000 mg | ORAL_TABLET | Freq: Every day | ORAL | 0 refills | Status: DC

## 2017-12-23 MED ORDER — ALBUTEROL SULFATE HFA 108 (90 BASE) MCG/ACT IN AERS: 2.0000 | INHALATION_SPRAY | RESPIRATORY_TRACT | 1 refills | Status: DC | PRN

## 2017-12-23 MED ORDER — PREDNISONE 10 MG PO TABS: ORAL_TABLET | ORAL | 0 refills | Status: AC

## 2017-12-23 MED ORDER — METAXALONE 800 MG PO TABS: 800.0000 mg | ORAL_TABLET | Freq: Three times a day (TID) | ORAL | 0 refills | Status: DC | PRN

## 2017-12-23 NOTE — ED Triage Notes (Signed)
Pt with cough and chest congestion for past 5 days.

## 2017-12-23 NOTE — ED Provider Notes (Signed)
[facility]    CSN: [id number] Arrival date & time: 12/23/17  1037     History   Chief Complaint Chief Complaint  Patient presents with  . Cough    HPI Ian Lucas is a 67 y.o. male.   C/o cough x5 days; bringing up thick clear/white phlegm. Denies fever or shortness of breath. Back surgery 2 months ago. Cough worse at night. Reports wheezing when he awakens at night. He has been seeing the [place] for "breathing problems" and reports recent rx for Spiriva.      Past Medical History:  Diagnosis Date  . Diabetes mellitus without complication (HCC)   . Hypercholesteremia   . Hypertension   . Neuropathy   . PTSD (post-traumatic stress disorder)     Patient Active Problem List   Diagnosis Date Noted  . Chronic pain syndrome 03/08/2016  . 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 Elevated prostate specific antigen (PSA) 06/18/2015  . Benign essential HTN 06/18/2015  . Chronic midline low back pain with bilateral sciatica 06/18/2015  . Diabetic neuropathy (HCC) 06/18/2015  . Posttraumatic stress disorder 06/18/2015  . Dyslipidemia 06/18/2015  . Lichen simplex 06/18/2015  . History of herpes zoster 06/18/2015  . Restless leg 06/18/2015  . OSA (obstructive sleep apnea) 06/18/2015  . Polyneuropathy 06/18/2015  . 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 Calculus of kidney 08/21/2013  . History of pancreatitis 08/15/2013  . Hydrocele 05/19/2013  . Arthritis of knee, degenerative 10/30/2012  . Lumbar canal stenosis 10/30/2012  . Chronic prostatitis 06/19/2012  . Hypogonadism in male 06/19/2012  . 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 Benign prostatic hyperplasia with urinary obstruction 06/19/2012    Past Surgical History:  Procedure Laterality Date  . BACK SURGERY    . CIRCUMCISION N/A   . HERNIA REPAIR    . HYDROCELE EXCISION / REPAIR Left        Home Medications    Prior to Admission medications     Medication Sig Start Date End Date Taking? Authorizing Provider  albuterol (PROVENTIL HFA;VENTOLIN HFA) 108 (90 Base) MCG/ACT inhaler Inhale 2 puffs into the lungs every 4 (four) hours as needed for wheezing or shortness of breath. 12/23/17   Arnaldo Natal, MD  amLODipine (NORVASC) 10 MG tablet Take 1 tablet (10 mg total) by mouth daily. 03/14/16   Alba Cory, MD  aspirin 81 MG tablet Take 81 mg by mouth daily.    [provider]  atorvastatin (LIPITOR) 40 MG tablet Take 40 mg by mouth daily.    [provider]  azithromycin (ZITHROMAX) 250 MG tablet Take 1 tablet (250 mg total) by mouth daily. Take first 2 tablets together, then 1 every day until finished. 12/23/17   Arnaldo Natal, MD  carvedilol (COREG) 25 MG tablet Take 25 mg by mouth 2 (two) times daily with a meal.    [provider]  co-enzyme Q-10 30 MG capsule Take 100 mg by mouth daily.    [provider]  DULoxetine (CYMBALTA) 60 MG capsule Take 1 capsule (60 mg total) by mouth daily. 03/14/16   Alba Cory, MD  HYDROcodone-acetaminophen (NORCO/VICODIN) 5-325 MG tablet 1-2 tabs po bid prn 12/08/16   Payton Mccallum, MD  ibuprofen (ADVIL,MOTRIN) 600 MG tablet Take 1 tablet (600 mg total) by mouth every 6 (six) hours as needed. 05/20/16   Domenick Gong, MD  insulin glargine (LANTUS) 100 UNIT/ML injection Inject 40 Units into the skin daily.  [provider]  losartan (COZAAR) 100 MG tablet Take 100 mg by mouth daily.    [provider]  metaxalone (SKELAXIN) 800 MG tablet Take 1 tablet (800 mg total) by mouth 3 (three) times daily as needed for muscle spasms. 12/23/17   Arnaldo Natal, MD  prazosin (MINIPRESS) 2 MG capsule Take 2 mg by mouth at bedtime.    [provider]  predniSONE (DELTASONE) 10 MG tablet Take 5 tablets (50 mg total) by mouth daily for 1 day, THEN 4 tablets (40 mg total) daily for 1 day, THEN 3 tablets (30 mg total) daily for 1 day, THEN 2  tablets (20 mg total) daily for 1 day, THEN 1 tablet (10 mg total) daily for 1 day. 12/23/17 12/28/17  Arnaldo Natal, MD  pregabalin (LYRICA) 200 MG capsule Take 1 capsule (200 mg total) by mouth 2 (two) times daily. 03/14/16   Alba Cory, MD  sildenafil (REVATIO) 20 MG tablet Take 20 mg by mouth as needed.    [provider]  Spacer/Aero-Holding Chambers (AEROCHAMBER PLUS) inhaler Use as instructed 05/20/16   Domenick Gong, MD  tiotropium (SPIRIVA) 18 MCG inhalation capsule Place 1 capsule (18 mcg total) into inhaler and inhale daily. 03/06/17   Shane Crutch, MD    Family History Family History  Problem Relation Age of Onset  . Stroke Mother   . Diabetes Father     Social History Social History   Tobacco Use  . Smoking status: Former Smoker    Packs/day: 0.25    Years: 30.00    Pack years: 7.50    Types: Cigarettes    Start date: 05/08/1968    Last attempt to quit: 06/17/1998    Years since quitting: 19.5  . Smokeless tobacco: Never Used  Substance Use Topics  . Alcohol use: No    Alcohol/week: 0.0 standard drinks  . Drug use: No     Allergies   Banana; Codeine; Iodine; Ace inhibitors; Gabapentin; and Metformin   Review of Systems Review of Systems  Constitutional: Negative for chills and fever.  HENT: Negative for sore throat and tinnitus.   Eyes: Negative for redness.  Respiratory: Positive for cough. Negative for shortness of breath.   Cardiovascular: Negative for chest pain and palpitations.  Gastrointestinal: Negative for abdominal pain, diarrhea, nausea and vomiting.  Genitourinary: Negative for dysuria, frequency and urgency.  Musculoskeletal: Negative for myalgias.  Skin: Negative for rash.       No lesions  Neurological: Negative for weakness.  Hematological: Does not bruise/bleed easily.  Psychiatric/Behavioral: Negative for suicidal ideas.     Physical Exam Triage Vital Signs ED Triage Vitals [12/23/17 1044]  Enc Vitals  Group     BP (!) 170/83     Pulse Rate 76     Resp 20     Temp 98 F (36.7 C)     Temp Source Oral     SpO2 97 %     Weight 225 lb (102.1 kg)     Height 5\' 7"  (1.702 m)     Head Circumference      Peak Flow      Pain Score 0     Pain Loc      Pain Edu?      Excl. in GC?    No data found.  Updated Vital Signs BP (!) 170/83 (BP Location: Left Arm)   Pulse 76   Temp 98 F (36.7 C) (Oral)   Resp 20  Ht 5\' 7"  (1.702 m)   Wt 102.1 kg   SpO2 97%   BMI 35.24 kg/m   Visual Acuity Right Eye Distance:   Left Eye Distance:   Bilateral Distance:    Right Eye Near:   Left Eye Near:    Bilateral Near:     Physical Exam  Constitutional: He is oriented to person, place, and time. He appears well-developed and well-nourished. No distress.  HENT:  Head: Normocephalic and atraumatic.  Mouth/Throat: Oropharynx is clear and moist.  Eyes: Pupils are equal, round, and reactive to light. Conjunctivae and EOM are normal. No scleral icterus.  Neck: Normal range of motion. Neck supple. No JVD present. No tracheal deviation present. No thyromegaly present.  Cardiovascular: Normal rate, regular rhythm and normal heart sounds. Exam reveals no gallop and no friction rub.  No murmur heard. Pulmonary/Chest: Effort normal and breath sounds normal. No respiratory distress.  Abdominal: Soft. Bowel sounds are normal. He exhibits no distension. There is no tenderness.  Musculoskeletal: Normal range of motion. He exhibits no edema.  Lymphadenopathy:    He has no cervical adenopathy.  Neurological: He is alert and oriented to person, place, and time. No cranial nerve deficit.  Skin: Skin is warm and dry. No rash noted. No erythema.  Psychiatric: He has a normal mood and affect. His behavior is normal. Judgment and thought content normal.     UC Treatments / Results  Labs (all labs ordered are listed, but only abnormal results are displayed) Labs Reviewed - No data to  display  EKG None  Radiology No results found.  Procedures Procedures (including critical care time)  Medications Ordered in UC Medications - No data to display  Initial Impression / Assessment and Plan / UC Course  I have reviewed the triage vital signs and the nursing notes.  Pertinent labs & imaging results that were available during my care of the patient were reviewed by me and considered in my medical decision making (see chart for details).     Symptoms consistent with COPD +/- atelectasis following surgery (patient admits to being more sedentary).   Final Clinical Impressions(s) / UC Diagnoses   Final diagnoses:  COPD exacerbation [facility])   Discharge Instructions   None    ED Prescriptions    Medication Sig Dispense Auth. Provider   albuterol (PROVENTIL HFA;VENTOLIN HFA) 108 (90 Base) MCG/ACT inhaler Inhale 2 puffs into the lungs every 4 (four) hours as needed for wheezing or shortness of breath. 1 Inhaler Arnaldo Natal, MD   azithromycin (ZITHROMAX) 250 MG tablet Take 1 tablet (250 mg total) by mouth daily. Take first 2 tablets together, then 1 every day until finished. 6 tablet Arnaldo Natal, MD   predniSONE (DELTASONE) 10 MG tablet Take 5 tablets (50 mg total) by mouth daily for 1 day, THEN 4 tablets (40 mg total) daily for 1 day, THEN 3 tablets (30 mg total) daily for 1 day, THEN 2 tablets (20 mg total) daily for 1 day, THEN 1 tablet (10 mg total) daily for 1 day. 15 tablet Arnaldo Natal, MD   metaxalone (SKELAXIN) 800 MG tablet Take 1 tablet (800 mg total) by mouth 3 (three) times daily as needed for muscle spasms. 21 tablet Arnaldo Natal, MD     Controlled Substance Prescriptions [place] Controlled Substance Registry consulted? Not Applicable   Arnaldo Natal, MD 12/23/17 1230

## 2017-12-24 ENCOUNTER — Ambulatory Visit (INDEPENDENT_AMBULATORY_CARE_PROVIDER_SITE_OTHER): Payer: Non-veteran care | Admitting: Podiatry

## 2017-12-24 ENCOUNTER — Encounter: Payer: Self-pay | Admitting: Podiatry

## 2017-12-24 DIAGNOSIS — M79674 Pain in right toe(s): Secondary | ICD-10-CM

## 2017-12-24 DIAGNOSIS — E1142 Type 2 diabetes mellitus with diabetic polyneuropathy: Secondary | ICD-10-CM

## 2017-12-24 DIAGNOSIS — B351 Tinea unguium: Secondary | ICD-10-CM

## 2017-12-24 DIAGNOSIS — M79675 Pain in left toe(s): Secondary | ICD-10-CM | POA: Diagnosis not present

## 2017-12-24 NOTE — Progress Notes (Signed)
This patient presents to the office with chief complaint of long thick nails and diabetic feet.  This patient  says there  is  no pain and discomfort in his  feet.  This patient says there are long thick painful nails.  These nails are painful walking and wearing shoes.  Patient has no history of infection or drainage from both feet.  Patient is unable to  self treat his own nails .  His wife and salon has been doing his nails.This patient presents  to the office today for treatment of the  long nails and a foot evaluation due to history of  diabetes.  General Appearance  Alert, conversant and in no acute stress.  Vascular  Dorsalis pedis and posterior tibial  pulses are palpable  bilaterally.  Capillary return is within normal limits  bilaterally. Temperature is within normal limits  bilaterally.  Neurologic  Senn-Weinstein monofilament wire test diminished  bilaterally. Muscle power within normal limits bilaterally.  Nails Thick disfigured discolored nails with subungual debris  Both hallux nails and fourth nail right foot.. No evidence of bacterial infection or drainage bilaterally.  Orthopedic  No limitations of motion of motion feet .  No crepitus or effusions noted.  No bony pathology or digital deformities noted.  Skin  normotropic skin with no porokeratosis noted bilaterally.  No signs of infections or ulcers noted.     Onychomycosis  Diabetes with no foot complications  IE  Debride nails x 3.  A diabetic foot exam was performed and there is no evidence of any vascular  Pathology.  Diminished LOPS noted.   RTC 4 months.   Helane Gunther DPM

## 2018-01-14 ENCOUNTER — Telehealth: Payer: Self-pay | Admitting: Podiatry

## 2018-01-14 NOTE — Telephone Encounter (Signed)
[facility] Requesting records be fax[facility] to [place] @ [phone] Attn: Mick Sell

## 2018-02-20 DIAGNOSIS — M48062 Spinal stenosis, lumbar region with neurogenic claudication: Secondary | ICD-10-CM | POA: Diagnosis not present

## 2018-02-20 DIAGNOSIS — M4120 Other idiopathic scoliosis, site unspecified: Secondary | ICD-10-CM | POA: Diagnosis not present

## 2018-02-20 DIAGNOSIS — M4326 Fusion of spine, lumbar region: Secondary | ICD-10-CM | POA: Diagnosis not present

## 2018-05-22 DIAGNOSIS — M48062 Spinal stenosis, lumbar region with neurogenic claudication: Secondary | ICD-10-CM | POA: Diagnosis not present

## 2018-05-22 DIAGNOSIS — M4120 Other idiopathic scoliosis, site unspecified: Secondary | ICD-10-CM | POA: Diagnosis not present

## 2018-08-21 LAB — BASIC METABOLIC PANEL
BUN: 22 — AB (ref 4–21)
Creatinine: 1.3 (ref 0.6–1.3)
Glucose: 170
Potassium: 4.7 (ref 3.4–5.3)
Sodium: 11 — AB (ref 137–147)

## 2018-08-21 LAB — LIPID PANEL
Cholesterol: 213 — AB (ref 0–200)
HDL: 67 (ref 35–70)
LDL Cholesterol: 125
Triglycerides: 181 — AB (ref 40–160)

## 2018-08-21 LAB — MICROALBUMIN, URINE: Microalb, Ur: 15

## 2018-08-21 LAB — HEMOGLOBIN A1C: Hemoglobin A1C: 6.7

## 2018-11-14 ENCOUNTER — Other Ambulatory Visit: Payer: Self-pay

## 2018-11-14 ENCOUNTER — Inpatient Hospital Stay
Admission: EM | Admit: 2018-11-14 | Discharge: 2018-11-17 | DRG: 419 | Disposition: A | Discharge status: Home / Self Care | Payer: No Typology Code available for payment source | Attending: Internal Medicine | Admitting: Internal Medicine

## 2018-11-14 DIAGNOSIS — R109 Unspecified abdominal pain: Secondary | ICD-10-CM | POA: Diagnosis not present

## 2018-11-14 DIAGNOSIS — G894 Chronic pain syndrome: Secondary | ICD-10-CM | POA: Diagnosis not present

## 2018-11-14 DIAGNOSIS — E785 Hyperlipidemia, unspecified: Secondary | ICD-10-CM | POA: Diagnosis present

## 2018-11-14 DIAGNOSIS — J449 Chronic obstructive pulmonary disease, unspecified: Secondary | ICD-10-CM | POA: Diagnosis not present

## 2018-11-14 DIAGNOSIS — Z03818 Encounter for observation for suspected exposure to other biological agents ruled out: Secondary | ICD-10-CM | POA: Diagnosis not present

## 2018-11-14 DIAGNOSIS — R1011 Right upper quadrant pain: Secondary | ICD-10-CM

## 2018-11-14 DIAGNOSIS — Z91018 Allergy to other foods: Secondary | ICD-10-CM

## 2018-11-14 DIAGNOSIS — K8020 Calculus of gallbladder without cholecystitis without obstruction: Secondary | ICD-10-CM | POA: Diagnosis not present

## 2018-11-14 DIAGNOSIS — N400 Enlarged prostate without lower urinary tract symptoms: Secondary | ICD-10-CM | POA: Diagnosis present

## 2018-11-14 DIAGNOSIS — Z91041 Radiographic dye allergy status: Secondary | ICD-10-CM

## 2018-11-14 DIAGNOSIS — K8000 Calculus of gallbladder with acute cholecystitis without obstruction: Principal | ICD-10-CM | POA: Diagnosis present

## 2018-11-14 DIAGNOSIS — E1142 Type 2 diabetes mellitus with diabetic polyneuropathy: Secondary | ICD-10-CM | POA: Diagnosis present

## 2018-11-14 DIAGNOSIS — R1084 Generalized abdominal pain: Secondary | ICD-10-CM | POA: Diagnosis not present

## 2018-11-14 DIAGNOSIS — Z1159 Encounter for screening for other viral diseases: Secondary | ICD-10-CM | POA: Diagnosis not present

## 2018-11-14 DIAGNOSIS — K429 Umbilical hernia without obstruction or gangrene: Secondary | ICD-10-CM | POA: Diagnosis present

## 2018-11-14 DIAGNOSIS — Z79899 Other long term (current) drug therapy: Secondary | ICD-10-CM

## 2018-11-14 DIAGNOSIS — I10 Essential (primary) hypertension: Secondary | ICD-10-CM | POA: Diagnosis not present

## 2018-11-14 DIAGNOSIS — K4020 Bilateral inguinal hernia, without obstruction or gangrene, not specified as recurrent: Secondary | ICD-10-CM | POA: Diagnosis present

## 2018-11-14 DIAGNOSIS — E7800 Pure hypercholesterolemia, unspecified: Secondary | ICD-10-CM | POA: Diagnosis not present

## 2018-11-14 DIAGNOSIS — K858 Other acute pancreatitis without necrosis or infection: Secondary | ICD-10-CM

## 2018-11-14 DIAGNOSIS — G4733 Obstructive sleep apnea (adult) (pediatric): Secondary | ICD-10-CM | POA: Diagnosis not present

## 2018-11-14 DIAGNOSIS — Z87891 Personal history of nicotine dependence: Secondary | ICD-10-CM

## 2018-11-14 DIAGNOSIS — E1140 Type 2 diabetes mellitus with diabetic neuropathy, unspecified: Secondary | ICD-10-CM | POA: Diagnosis not present

## 2018-11-14 DIAGNOSIS — Z91010 Allergy to peanuts: Secondary | ICD-10-CM | POA: Diagnosis not present

## 2018-11-14 DIAGNOSIS — Z885 Allergy status to narcotic agent status: Secondary | ICD-10-CM

## 2018-11-14 DIAGNOSIS — K82A1 Gangrene of gallbladder in cholecystitis: Secondary | ICD-10-CM | POA: Diagnosis not present

## 2018-11-14 DIAGNOSIS — R748 Abnormal levels of other serum enzymes: Secondary | ICD-10-CM | POA: Diagnosis present

## 2018-11-14 DIAGNOSIS — Z833 Family history of diabetes mellitus: Secondary | ICD-10-CM

## 2018-11-14 DIAGNOSIS — Z7982 Long term (current) use of aspirin: Secondary | ICD-10-CM | POA: Diagnosis not present

## 2018-11-14 DIAGNOSIS — Z794 Long term (current) use of insulin: Secondary | ICD-10-CM

## 2018-11-14 DIAGNOSIS — E1165 Type 2 diabetes mellitus with hyperglycemia: Secondary | ICD-10-CM | POA: Diagnosis present

## 2018-11-14 DIAGNOSIS — K810 Acute cholecystitis: Secondary | ICD-10-CM | POA: Diagnosis not present

## 2018-11-14 DIAGNOSIS — R1013 Epigastric pain: Secondary | ICD-10-CM | POA: Diagnosis not present

## 2018-11-14 DIAGNOSIS — Z888 Allergy status to other drugs, medicaments and biological substances status: Secondary | ICD-10-CM

## 2018-11-14 DIAGNOSIS — R079 Chest pain, unspecified: Secondary | ICD-10-CM | POA: Diagnosis not present

## 2018-11-14 DIAGNOSIS — R410 Disorientation, unspecified: Secondary | ICD-10-CM | POA: Diagnosis present

## 2018-11-14 DIAGNOSIS — K4090 Unilateral inguinal hernia, without obstruction or gangrene, not specified as recurrent: Secondary | ICD-10-CM | POA: Diagnosis not present

## 2018-11-14 DIAGNOSIS — F4310 Post-traumatic stress disorder, unspecified: Secondary | ICD-10-CM | POA: Diagnosis present

## 2018-11-14 HISTORY — DX: Acute pancreatitis without necrosis or infection, unspecified: K85.90

## 2018-11-14 LAB — CBC WITH DIFFERENTIAL/PLATELET
Abs Immature Granulocytes: 0.02 10*3/uL (ref 0.00–0.07)
Basophils Absolute: 0 10*3/uL (ref 0.0–0.1)
Basophils Relative: 0 %
Eosinophils Absolute: 0.1 10*3/uL (ref 0.0–0.5)
Eosinophils Relative: 2 %
HCT: 43.3 % (ref 39.0–52.0)
Hemoglobin: 14.4 g/dL (ref 13.0–17.0)
Immature Granulocytes: 0 %
Lymphocytes Relative: 33 %
Lymphs Abs: 1.9 10*3/uL (ref 0.7–4.0)
MCH: 29.2 pg (ref 26.0–34.0)
MCHC: 33.3 g/dL (ref 30.0–36.0)
MCV: 87.8 fL (ref 80.0–100.0)
Monocytes Absolute: 0.5 10*3/uL (ref 0.1–1.0)
Monocytes Relative: 8 %
Neutro Abs: 3.3 10*3/uL (ref 1.7–7.7)
Neutrophils Relative %: 57 %
Platelets: 141 10*3/uL — ABNORMAL LOW (ref 150–400)
RBC: 4.93 MIL/uL (ref 4.22–5.81)
RDW: 14 % (ref 11.5–15.5)
WBC: 5.8 10*3/uL (ref 4.0–10.5)
nRBC: 0 % (ref 0.0–0.2)

## 2018-11-14 LAB — LIPASE, BLOOD: Lipase: 92 U/L — ABNORMAL HIGH (ref 11–51)

## 2018-11-14 LAB — COMPREHENSIVE METABOLIC PANEL
ALT: 17 U/L (ref 0–44)
AST: 22 U/L (ref 15–41)
Albumin: 3.8 g/dL (ref 3.5–5.0)
Alkaline Phosphatase: 61 U/L (ref 38–126)
Anion gap: 9 (ref 5–15)
BUN: 17 mg/dL (ref 8–23)
CO2: 25 mmol/L (ref 22–32)
Calcium: 9.1 mg/dL (ref 8.9–10.3)
Chloride: 106 mmol/L (ref 98–111)
Creatinine, Ser: 1.19 mg/dL (ref 0.61–1.24)
GFR calc Af Amer: >60 mL/min (ref >60)
GFR calc non Af Amer: >60 mL/min (ref >60)
Glucose, Bld: 193 mg/dL — ABNORMAL HIGH (ref 70–99)
Potassium: 4 mmol/L (ref 3.5–5.1)
Sodium: 140 mmol/L (ref 135–145)
Total Bilirubin: 0.7 mg/dL (ref 0.3–1.2)
Total Protein: 7.5 g/dL (ref 6.5–8.1)

## 2018-11-14 LAB — TROPONIN I (HIGH SENSITIVITY): Troponin I (High Sensitivity): 6 ng/L (ref <18)

## 2018-11-14 MED ORDER — MORPHINE SULFATE (PF) 4 MG/ML IV SOLN
4.0000 mg | Freq: Once | INTRAVENOUS | Status: AC
  Administered 2018-11-14: 4 mg via INTRAVENOUS
  Filled 2018-11-14: qty 1

## 2018-11-14 MED ORDER — ONDANSETRON HCL 4 MG/2ML IJ SOLN
4.0000 mg | Freq: Once | INTRAMUSCULAR | Status: AC
  Administered 2018-11-14: 4 mg via INTRAVENOUS
  Filled 2018-11-14: qty 2

## 2018-11-14 NOTE — ED Triage Notes (Signed)
Patient from home c/o of mid epigastric pain that radiates into left side chest. Reports pain began 1 hour ago. Denies cardiac history. States hx of pancreatis. Nitro spray given in route to ed by ems. 324mg  ASA also given in route.

## 2018-11-15 ENCOUNTER — Inpatient Hospital Stay: Payer: No Typology Code available for payment source | Admitting: Anesthesiology

## 2018-11-15 ENCOUNTER — Inpatient Hospital Stay: Payer: No Typology Code available for payment source

## 2018-11-15 ENCOUNTER — Encounter
Admission: EM | Disposition: A | Discharge status: Home / Self Care | Payer: Self-pay | Source: Home / Self Care | Attending: Internal Medicine

## 2018-11-15 ENCOUNTER — Encounter: Payer: Self-pay | Admitting: Internal Medicine

## 2018-11-15 ENCOUNTER — Emergency Department: Payer: No Typology Code available for payment source

## 2018-11-15 DIAGNOSIS — K429 Umbilical hernia without obstruction or gangrene: Secondary | ICD-10-CM | POA: Diagnosis present

## 2018-11-15 DIAGNOSIS — Z794 Long term (current) use of insulin: Secondary | ICD-10-CM | POA: Diagnosis not present

## 2018-11-15 DIAGNOSIS — R1084 Generalized abdominal pain: Secondary | ICD-10-CM

## 2018-11-15 DIAGNOSIS — Z87891 Personal history of nicotine dependence: Secondary | ICD-10-CM | POA: Diagnosis not present

## 2018-11-15 DIAGNOSIS — Z91041 Radiographic dye allergy status: Secondary | ICD-10-CM | POA: Diagnosis not present

## 2018-11-15 DIAGNOSIS — R109 Unspecified abdominal pain: Secondary | ICD-10-CM | POA: Diagnosis not present

## 2018-11-15 DIAGNOSIS — G4733 Obstructive sleep apnea (adult) (pediatric): Secondary | ICD-10-CM | POA: Diagnosis present

## 2018-11-15 DIAGNOSIS — I10 Essential (primary) hypertension: Secondary | ICD-10-CM | POA: Diagnosis not present

## 2018-11-15 DIAGNOSIS — R748 Abnormal levels of other serum enzymes: Secondary | ICD-10-CM | POA: Diagnosis not present

## 2018-11-15 DIAGNOSIS — E1165 Type 2 diabetes mellitus with hyperglycemia: Secondary | ICD-10-CM | POA: Diagnosis present

## 2018-11-15 DIAGNOSIS — K82A1 Gangrene of gallbladder in cholecystitis: Secondary | ICD-10-CM | POA: Diagnosis present

## 2018-11-15 DIAGNOSIS — N400 Enlarged prostate without lower urinary tract symptoms: Secondary | ICD-10-CM | POA: Diagnosis present

## 2018-11-15 DIAGNOSIS — Z7982 Long term (current) use of aspirin: Secondary | ICD-10-CM | POA: Diagnosis not present

## 2018-11-15 DIAGNOSIS — Z79899 Other long term (current) drug therapy: Secondary | ICD-10-CM | POA: Diagnosis not present

## 2018-11-15 DIAGNOSIS — F4310 Post-traumatic stress disorder, unspecified: Secondary | ICD-10-CM | POA: Diagnosis present

## 2018-11-15 DIAGNOSIS — K8000 Calculus of gallbladder with acute cholecystitis without obstruction: Secondary | ICD-10-CM | POA: Diagnosis present

## 2018-11-15 DIAGNOSIS — Z91010 Allergy to peanuts: Secondary | ICD-10-CM | POA: Diagnosis not present

## 2018-11-15 DIAGNOSIS — E7800 Pure hypercholesterolemia, unspecified: Secondary | ICD-10-CM | POA: Diagnosis not present

## 2018-11-15 DIAGNOSIS — R1011 Right upper quadrant pain: Secondary | ICD-10-CM | POA: Diagnosis present

## 2018-11-15 DIAGNOSIS — Z885 Allergy status to narcotic agent status: Secondary | ICD-10-CM | POA: Diagnosis not present

## 2018-11-15 DIAGNOSIS — K4020 Bilateral inguinal hernia, without obstruction or gangrene, not specified as recurrent: Secondary | ICD-10-CM | POA: Diagnosis present

## 2018-11-15 DIAGNOSIS — Z833 Family history of diabetes mellitus: Secondary | ICD-10-CM | POA: Diagnosis not present

## 2018-11-15 DIAGNOSIS — E1142 Type 2 diabetes mellitus with diabetic polyneuropathy: Secondary | ICD-10-CM | POA: Diagnosis present

## 2018-11-15 DIAGNOSIS — E1140 Type 2 diabetes mellitus with diabetic neuropathy, unspecified: Secondary | ICD-10-CM | POA: Diagnosis not present

## 2018-11-15 DIAGNOSIS — K810 Acute cholecystitis: Secondary | ICD-10-CM

## 2018-11-15 DIAGNOSIS — E785 Hyperlipidemia, unspecified: Secondary | ICD-10-CM | POA: Diagnosis present

## 2018-11-15 DIAGNOSIS — K8020 Calculus of gallbladder without cholecystitis without obstruction: Secondary | ICD-10-CM | POA: Diagnosis not present

## 2018-11-15 DIAGNOSIS — G894 Chronic pain syndrome: Secondary | ICD-10-CM | POA: Diagnosis present

## 2018-11-15 DIAGNOSIS — Z888 Allergy status to other drugs, medicaments and biological substances status: Secondary | ICD-10-CM | POA: Diagnosis not present

## 2018-11-15 DIAGNOSIS — Z91018 Allergy to other foods: Secondary | ICD-10-CM | POA: Diagnosis not present

## 2018-11-15 DIAGNOSIS — Z1159 Encounter for screening for other viral diseases: Secondary | ICD-10-CM | POA: Diagnosis not present

## 2018-11-15 DIAGNOSIS — R410 Disorientation, unspecified: Secondary | ICD-10-CM | POA: Diagnosis present

## 2018-11-15 DIAGNOSIS — J449 Chronic obstructive pulmonary disease, unspecified: Secondary | ICD-10-CM | POA: Diagnosis not present

## 2018-11-15 HISTORY — PX: CHOLECYSTECTOMY: SHX55

## 2018-11-15 LAB — GLUCOSE, CAPILLARY
Glucose-Capillary: 131 mg/dL — ABNORMAL HIGH (ref 70–99)
Glucose-Capillary: 143 mg/dL — ABNORMAL HIGH (ref 70–99)
Glucose-Capillary: 157 mg/dL — ABNORMAL HIGH (ref 70–99)
Glucose-Capillary: 168 mg/dL — ABNORMAL HIGH (ref 70–99)

## 2018-11-15 LAB — SARS CORONAVIRUS 2 BY RT PCR (HOSPITAL ORDER, PERFORMED IN ~~LOC~~ HOSPITAL LAB): SARS Coronavirus 2: NEGATIVE

## 2018-11-15 LAB — TSH: TSH: 2.321 u[IU]/mL (ref 0.350–4.500)

## 2018-11-15 LAB — LIPID PANEL
Cholesterol: 202 mg/dL — ABNORMAL HIGH (ref 0–200)
HDL: 62 mg/dL (ref >40)
LDL Cholesterol: 129 mg/dL — ABNORMAL HIGH (ref 0–99)
Total CHOL/HDL Ratio: 3.3 RATIO
Triglycerides: 57 mg/dL (ref <150)
VLDL: 11 mg/dL (ref 0–40)

## 2018-11-15 SURGERY — LAPAROSCOPIC CHOLECYSTECTOMY
Anesthesia: General

## 2018-11-15 MED ORDER — SUGAMMADEX SODIUM 200 MG/2ML IV SOLN
INTRAVENOUS | Status: AC
  Filled 2018-11-15: qty 4

## 2018-11-15 MED ORDER — MORPHINE SULFATE (PF) 2 MG/ML IV SOLN
2.0000 mg | INTRAVENOUS | Status: DC | PRN
  Administered 2018-11-15: 03:00:00 2 mg via INTRAVENOUS
  Filled 2018-11-15: qty 1

## 2018-11-15 MED ORDER — PROPOFOL 10 MG/ML IV BOLUS
INTRAVENOUS | Status: AC
  Filled 2018-11-15: qty 20

## 2018-11-15 MED ORDER — ONDANSETRON HCL 4 MG PO TABS: 4.0000 mg | ORAL_TABLET | Freq: Four times a day (QID) | ORAL | Status: DC | PRN

## 2018-11-15 MED ORDER — LORAZEPAM 2 MG/ML IJ SOLN
INTRAMUSCULAR | Status: AC
  Administered 2018-11-15: 21:00:00 1 mg via INTRAVENOUS
  Filled 2018-11-15: qty 1

## 2018-11-15 MED ORDER — DEXAMETHASONE SODIUM PHOSPHATE 10 MG/ML IJ SOLN
INTRAMUSCULAR | Status: AC
  Filled 2018-11-15: qty 1

## 2018-11-15 MED ORDER — HALOPERIDOL LACTATE 5 MG/ML IJ SOLN
INTRAMUSCULAR | Status: AC
  Filled 2018-11-15: qty 1

## 2018-11-15 MED ORDER — PROPOFOL 10 MG/ML IV BOLUS
INTRAVENOUS | Status: DC | PRN
  Administered 2018-11-15: 150 mg via INTRAVENOUS

## 2018-11-15 MED ORDER — DOCUSATE SODIUM 100 MG PO CAPS
100.0000 mg | ORAL_CAPSULE | Freq: Two times a day (BID) | ORAL | Status: DC
  Administered 2018-11-16 – 2018-11-17 (×3): 100 mg via ORAL
  Filled 2018-11-15 (×3): qty 1

## 2018-11-15 MED ORDER — MORPHINE SULFATE (PF) 4 MG/ML IV SOLN
4.0000 mg | Freq: Once | INTRAVENOUS | Status: AC
  Administered 2018-11-15: 4 mg via INTRAVENOUS
  Filled 2018-11-15: qty 1

## 2018-11-15 MED ORDER — ACETAMINOPHEN 10 MG/ML IV SOLN
INTRAVENOUS | Status: AC
  Filled 2018-11-15: qty 100

## 2018-11-15 MED ORDER — ACETAMINOPHEN 650 MG RE SUPP: 650.0000 mg | Freq: Four times a day (QID) | RECTAL | Status: DC | PRN

## 2018-11-15 MED ORDER — SUGAMMADEX SODIUM 200 MG/2ML IV SOLN
INTRAVENOUS | Status: DC | PRN
  Administered 2018-11-15: 400 mg via INTRAVENOUS

## 2018-11-15 MED ORDER — FENTANYL CITRATE (PF) 250 MCG/5ML IJ SOLN
INTRAMUSCULAR | Status: AC
  Filled 2018-11-15: qty 5

## 2018-11-15 MED ORDER — DEXAMETHASONE SODIUM PHOSPHATE 10 MG/ML IJ SOLN
INTRAMUSCULAR | Status: DC | PRN
  Administered 2018-11-15: 5 mg via INTRAVENOUS

## 2018-11-15 MED ORDER — ACETAMINOPHEN 10 MG/ML IV SOLN
INTRAVENOUS | Status: DC | PRN
  Administered 2018-11-15: 1000 mg via INTRAVENOUS

## 2018-11-15 MED ORDER — ROCURONIUM BROMIDE 100 MG/10ML IV SOLN
INTRAVENOUS | Status: DC | PRN
  Administered 2018-11-15: 30 mg via INTRAVENOUS

## 2018-11-15 MED ORDER — PIPERACILLIN-TAZOBACTAM 3.375 G IVPB
3.3750 g | Freq: Three times a day (TID) | INTRAVENOUS | Status: DC
  Administered 2018-11-15 – 2018-11-17 (×5): 3.375 g via INTRAVENOUS
  Filled 2018-11-15 (×5): qty 50

## 2018-11-15 MED ORDER — FENTANYL CITRATE (PF) 100 MCG/2ML IJ SOLN
25.0000 ug | INTRAMUSCULAR | Status: DC | PRN
  Administered 2018-11-15: 25 ug via INTRAVENOUS
  Administered 2018-11-15: 21:00:00 50 ug via INTRAVENOUS
  Administered 2018-11-15: 25 ug via INTRAVENOUS

## 2018-11-15 MED ORDER — ACETAMINOPHEN 325 MG PO TABS: 650.0000 mg | ORAL_TABLET | Freq: Four times a day (QID) | ORAL | Status: DC | PRN

## 2018-11-15 MED ORDER — LORAZEPAM 2 MG/ML IJ SOLN
1.0000 mg | Freq: Once | INTRAMUSCULAR | Status: AC
  Administered 2018-11-15: 1 mg via INTRAVENOUS

## 2018-11-15 MED ORDER — HYDROMORPHONE HCL 1 MG/ML IJ SOLN
0.5000 mg | INTRAMUSCULAR | Status: DC | PRN
  Administered 2018-11-15 (×2): 0.5 mg via INTRAVENOUS
  Filled 2018-11-15 (×2): qty 0.5

## 2018-11-15 MED ORDER — LIDOCAINE HCL (CARDIAC) PF 100 MG/5ML IV SOSY
PREFILLED_SYRINGE | INTRAVENOUS | Status: DC | PRN
  Administered 2018-11-15: 100 mg via INTRAVENOUS

## 2018-11-15 MED ORDER — INSULIN ASPART 100 UNIT/ML ~~LOC~~ SOLN
0.0000 [IU] | SUBCUTANEOUS | Status: DC
  Administered 2018-11-15 – 2018-11-16 (×3): 3 [IU] via SUBCUTANEOUS
  Filled 2018-11-15 (×3): qty 1

## 2018-11-15 MED ORDER — MORPHINE SULFATE (PF) 2 MG/ML IV SOLN
2.0000 mg | Freq: Once | INTRAVENOUS | Status: AC
  Administered 2018-11-15: 2 mg via INTRAVENOUS
  Filled 2018-11-15: qty 1

## 2018-11-15 MED ORDER — HALOPERIDOL LACTATE 5 MG/ML IJ SOLN
5.0000 mg | Freq: Once | INTRAMUSCULAR | Status: AC
  Administered 2018-11-15: 21:00:00 5 mg via INTRAVENOUS

## 2018-11-15 MED ORDER — BUPIVACAINE-EPINEPHRINE 0.25% -1:200000 IJ SOLN
INTRAMUSCULAR | Status: DC | PRN
  Administered 2018-11-15: 30 mL

## 2018-11-15 MED ORDER — SODIUM CHLORIDE 0.9 % IV SOLN
INTRAVENOUS | Status: DC
  Administered 2018-11-15 – 2018-11-16 (×4): via INTRAVENOUS

## 2018-11-15 MED ORDER — KETOROLAC TROMETHAMINE 30 MG/ML IJ SOLN
30.0000 mg | Freq: Four times a day (QID) | INTRAMUSCULAR | Status: DC | PRN
  Administered 2018-11-16 (×3): 30 mg via INTRAVENOUS
  Filled 2018-11-15 (×3): qty 1

## 2018-11-15 MED ORDER — SUCCINYLCHOLINE CHLORIDE 20 MG/ML IJ SOLN
INTRAMUSCULAR | Status: DC | PRN
  Administered 2018-11-15: 200 mg via INTRAVENOUS

## 2018-11-15 MED ORDER — ONDANSETRON HCL 4 MG/2ML IJ SOLN
INTRAMUSCULAR | Status: DC | PRN
  Administered 2018-11-15: 4 mg via INTRAVENOUS

## 2018-11-15 MED ORDER — ACETAMINOPHEN 500 MG PO TABS
1000.0000 mg | ORAL_TABLET | Freq: Four times a day (QID) | ORAL | Status: DC
  Administered 2018-11-16 – 2018-11-17 (×5): 1000 mg via ORAL
  Filled 2018-11-15 (×6): qty 2

## 2018-11-15 MED ORDER — SUCCINYLCHOLINE CHLORIDE 20 MG/ML IJ SOLN
INTRAMUSCULAR | Status: AC
  Filled 2018-11-15: qty 1

## 2018-11-15 MED ORDER — ONDANSETRON HCL 4 MG/2ML IJ SOLN: 4.0000 mg | Freq: Four times a day (QID) | INTRAMUSCULAR | Status: DC | PRN

## 2018-11-15 MED ORDER — ONDANSETRON HCL 4 MG/2ML IJ SOLN
INTRAMUSCULAR | Status: AC
  Filled 2018-11-15: qty 2

## 2018-11-15 MED ORDER — HALOPERIDOL LACTATE 5 MG/ML IJ SOLN
INTRAMUSCULAR | Status: AC
  Administered 2018-11-15: 5 mg via INTRAVENOUS
  Filled 2018-11-15: qty 1

## 2018-11-15 MED ORDER — FENTANYL CITRATE (PF) 100 MCG/2ML IJ SOLN
INTRAMUSCULAR | Status: DC | PRN
  Administered 2018-11-15: 50 ug via INTRAVENOUS
  Administered 2018-11-15: 100 ug via INTRAVENOUS

## 2018-11-15 MED ORDER — SODIUM CHLORIDE 0.9 % IV SOLN
INTRAVENOUS | Status: DC | PRN
  Administered 2018-11-15 (×2): via INTRAVENOUS

## 2018-11-15 MED ORDER — ENOXAPARIN SODIUM 40 MG/0.4ML ~~LOC~~ SOLN: 40.0000 mg | SUBCUTANEOUS | Status: DC

## 2018-11-15 MED ORDER — LIDOCAINE HCL (PF) 2 % IJ SOLN
INTRAMUSCULAR | Status: AC
  Filled 2018-11-15: qty 10

## 2018-11-15 MED ORDER — FENTANYL CITRATE (PF) 100 MCG/2ML IJ SOLN
INTRAMUSCULAR | Status: AC
  Administered 2018-11-15: 21:00:00 50 ug via INTRAVENOUS
  Filled 2018-11-15: qty 2

## 2018-11-15 MED ORDER — HALOPERIDOL LACTATE 5 MG/ML IJ SOLN
5.0000 mg | Freq: Once | INTRAMUSCULAR | Status: AC
  Administered 2018-11-15: 5 mg via INTRAVENOUS

## 2018-11-15 SURGICAL SUPPLY — 55 items
ADH SKN CLS APL DERMABOND .7 (GAUZE/BANDAGES/DRESSINGS) ×1
APL PRP STRL LF DISP 70% ISPRP (MISCELLANEOUS) ×1
APL SRG 38 LTWT LNG FL B (MISCELLANEOUS) ×1
APL SWBSTK 6 STRL LF DISP (MISCELLANEOUS) ×1
APPLICATOR ARISTA FLEXITIP XL (MISCELLANEOUS) ×2 IMPLANT
APPLICATOR COTTON TIP 6 STRL (MISCELLANEOUS) ×1 IMPLANT
APPLICATOR COTTON TIP 6IN STRL (MISCELLANEOUS) ×3
APPLIER CLIP 5 13 M/L LIGAMAX5 (MISCELLANEOUS)
APR CLP MED LRG 5 ANG JAW (MISCELLANEOUS)
BAG SPEC RTRVL LRG 6X4 10 (ENDOMECHANICALS) ×1
BULB RESERV EVAC DRAIN JP 100C (MISCELLANEOUS) ×2 IMPLANT
CANISTER SUCT 1200ML W/VALVE (MISCELLANEOUS) ×3 IMPLANT
CHLORAPREP W/TINT 26 (MISCELLANEOUS) ×3 IMPLANT
CHOLANGIOGRAM CATH TAUT (CATHETERS) IMPLANT
CLIP APPLIE 5 13 M/L LIGAMAX5 (MISCELLANEOUS) ×1 IMPLANT
COVER WAND RF STERILE (DRAPES) ×3 IMPLANT
DECANTER SPIKE VIAL GLASS SM (MISCELLANEOUS) ×6 IMPLANT
DERMABOND ADVANCED (GAUZE/BANDAGES/DRESSINGS) ×2
DERMABOND ADVANCED .7 DNX12 (GAUZE/BANDAGES/DRESSINGS) ×1 IMPLANT
DRAIN CHANNEL JP 19F (MISCELLANEOUS) ×2 IMPLANT
DRAPE C-ARM XRAY 36X54 (DRAPES) ×2 IMPLANT
ELECT CAUTERY BLADE 6.4 (BLADE) ×3 IMPLANT
ELECT REM PT RETURN 9FT ADLT (ELECTROSURGICAL) ×3
ELECTRODE REM PT RTRN 9FT ADLT (ELECTROSURGICAL) ×1 IMPLANT
GLOVE BIO SURGEON STRL SZ7 (GLOVE) ×7 IMPLANT
GOWN STRL REUS W/ TWL LRG LVL3 (GOWN DISPOSABLE) ×3 IMPLANT
GOWN STRL REUS W/TWL LRG LVL3 (GOWN DISPOSABLE) ×9
HEMOSTAT ARISTA ABSORB 3G PWDR (HEMOSTASIS) ×2 IMPLANT
IRRIGATION STRYKERFLOW (MISCELLANEOUS) ×1 IMPLANT
IRRIGATOR STRYKERFLOW (MISCELLANEOUS) ×3
IV CATH ANGIO 12GX3 LT BLUE (NEEDLE) IMPLANT
IV NS 1000ML (IV SOLUTION) ×3
IV NS 1000ML BAXH (IV SOLUTION) ×1 IMPLANT
L-HOOK LAP DISP 36CM (ELECTROSURGICAL) ×3
LHOOK LAP DISP 36CM (ELECTROSURGICAL) ×1 IMPLANT
NEEDLE HYPO 22GX1.5 SAFETY (NEEDLE) ×3 IMPLANT
NS IRRIG 500ML POUR BTL (IV SOLUTION) ×3 IMPLANT
PACK LAP CHOLECYSTECTOMY (MISCELLANEOUS) ×3 IMPLANT
PENCIL ELECTRO HAND CTR (MISCELLANEOUS) ×3 IMPLANT
POUCH SPECIMEN RETRIEVAL 10MM (ENDOMECHANICALS) ×3 IMPLANT
SCISSORS METZENBAUM CVD 33 (INSTRUMENTS) ×3 IMPLANT
SET TUBE SMOKE EVAC HIGH FLOW (TUBING) ×3 IMPLANT
SLEEVE ENDOPATH XCEL 5M (ENDOMECHANICALS) ×8 IMPLANT
SLEEVE SCD COMPRESS THIGH MED (MISCELLANEOUS) ×2 IMPLANT
SPONGE DRAIN TRACH 4X4 STRL 2S (GAUZE/BANDAGES/DRESSINGS) ×2 IMPLANT
SPONGE LAP 18X18 RF (DISPOSABLE) ×3 IMPLANT
STOPCOCK 4 WAY LG BORE MALE ST (IV SETS) IMPLANT
SUT ETHIBOND 0 MO6 C/R (SUTURE) IMPLANT
SUT ETHILON 3-0 FS-10 30 BLK (SUTURE) ×3
SUT MNCRL AB 4-0 PS2 18 (SUTURE) ×3 IMPLANT
SUT VICRYL 0 AB UR-6 (SUTURE) ×8 IMPLANT
SUTURE EHLN 3-0 FS-10 30 BLK (SUTURE) IMPLANT
SYR 20CC LL (SYRINGE) ×3 IMPLANT
TROCAR XCEL BLUNT TIP 100MML (ENDOMECHANICALS) ×3 IMPLANT
TROCAR XCEL NON-BLD 5MMX100MML (ENDOMECHANICALS) ×3 IMPLANT

## 2018-11-15 NOTE — OR Nursing (Signed)
Patient very agitated.  Trying to get up.  Urinal given, but was not able to void.  Dr Amie Critchley here.  Medicated x2 with Haldol IV.

## 2018-11-15 NOTE — Progress Notes (Signed)
Paged Dr. Benjie Karvonen re: patient disoriented to time and place.

## 2018-11-15 NOTE — Consult Note (Signed)
Ian Miniumarren Anasophia Pecor, MD [facility]  [address] [place], [place] [address] Phone: [phone] Fax : [phone]  Consultation  Referring Provider:     Dr. Juliene PinaMody Primary Care Physician:  Ian CorySowles, Krichna, MD Primary Gastroenterologist:  Ian FitzUnassigned         Reason for Consultation:     Abdominal pain  Date of Admission:  11/14/2018 Date of Consultation:  11/15/2018         HPI:   Ian Lucas is a 68 y.o. male who was admitted with abdominal pain that was reported to be 10 out of 10.  The patient had labs sent off that showed him to have a lipase of 92 with normal being below 51.  The patient has had normal liver enzymes and his CBC showed only slight thrombocytopenia at 141.  The patient has had a large amount of pain medication prior to me coming to see him and he is unable to give any history about how long his symptoms have been present.  In the emergency room the patient had an ultrasound that showed:  IMPRESSION: 1. There is cholelithiasis without secondary signs of acute cholecystitis. 2. Diffuse increased echogenicity with slightly heterogeneous liver. Appearance typically secondary to fatty infiltration. Fibrosis secondary consideration. No secondary findings of cirrhosis noted. No focal hepatic lesion or intrahepatic biliary duct dilatation.  The patient does report significant abdominal pain.  In the ER he was reporting it to be 10 out of 10.  Past Medical History:  Diagnosis Date  . Diabetes mellitus without complication (HCC)   . Hypercholesteremia   . Hypertension   . Neuropathy   . Pancreatitis   . PTSD (post-traumatic stress disorder)     Past Surgical History:  Procedure Laterality Date  . BACK SURGERY    . CIRCUMCISION N/A   . HERNIA REPAIR    . HYDROCELE EXCISION / REPAIR Left     Prior to Admission medications   Medication Sig Start Date End Date Taking? Authorizing Provider  acyclovir ointment (ZOVIRAX) 5 % acyclovir 5 % topical ointment  1 (ONE)  OINTMENT, EXTERNAL, APPLY TO RASH FOUR TIMES DAILY    [provider]  albuterol (PROVENTIL HFA;VENTOLIN HFA) 108 (90 Base) MCG/ACT inhaler Inhale 2 puffs into the lungs every 4 (four) hours as needed for wheezing or shortness of breath. 12/23/17   Arnaldo Nataliamond, Michael S, MD  Alogliptin Benzoate 25 MG TABS Take by mouth.    [provider]  ALPRAZolam Prudy Feeler(XANAX) 0.5 MG tablet Take 1 tablet 30 minutes prior to procedure. 05/25/17   [provider]  amLODipine (NORVASC) 10 MG tablet Take by mouth.    [provider]  aspirin 81 MG tablet Take 81 mg by mouth daily.    [provider]  atorvastatin (LIPITOR) 20 MG tablet Take by mouth.    [provider]  azithromycin (ZITHROMAX) 250 MG tablet Take 1 tablet (250 mg total) by mouth daily. Take first 2 tablets together, then 1 every day until finished. 12/23/17   Arnaldo Nataliamond, Michael S, MD  capsaicin (ZOSTRIX) 0.025 % cream Apply topically.    [provider]  carvedilol (COREG) 25 MG tablet Take 25 mg by mouth 2 (two) times daily with a meal.     [provider]  co-enzyme Q-10 30 MG capsule Take 100 mg by mouth daily.    [provider]  DULoxetine (CYMBALTA) 60 MG capsule Take by mouth.    [provider]  HYDROcodone-acetaminophen (NORCO/VICODIN) 5-325  MG tablet 1-2 tabs po bid prn 12/08/16   Payton Mccallum, MD  hydroxychloroquine (PLAQUENIL) 200 MG tablet Take by mouth.    [provider]  ibuprofen (ADVIL,MOTRIN) 600 MG tablet Take 1 tablet (600 mg total) by mouth every 6 (six) hours as needed. 05/20/16   Domenick Gong, MD  insulin glargine (LANTUS) 100 UNIT/ML injection Inject into the skin. 11/06/17 11/06/18  [provider]  lidocaine (XYLOCAINE) 5 % ointment Apply 2-4 grams(1-2 inch ribbon) to affected areas up to three times a day as needed for pain. 03/08/16   [provider]  losartan (COZAAR) 100 MG tablet Take by mouth. 06/19/12   [provider]  metaxalone (SKELAXIN) 800 MG tablet Take 1 tablet (800 mg total) by mouth 3 (three) times daily as needed for muscle spasms. 12/23/17   Arnaldo Natal, MD  methocarbamol (ROBAXIN) 500 MG tablet TAKE 1 TABLET (500 MG TOTAL) BY MOUTH 3 (THREE) TIMES DAILY AS NEEDED (MUSCLE SPASMS) 11/13/17   [provider]  naproxen (NAPROSYN) 250 MG tablet naproxen 250 mg tablet    [provider]  orphenadrine (NORFLEX) 100 MG tablet orphenadrine citrate ER 100 mg tablet,extended release  TAKE 1 TABLET TWICE A DAY    [provider]  oxyCODONE (OXY IR/ROXICODONE) 5 MG immediate release tablet TAKE 1 TABLET (5 MG TOTAL) BY MOUTH EVERY 4 (FOUR) HOURS AS NEEDED FOR PAIN 12/13/17   [provider]  oxyCODONE-acetaminophen (PERCOCET/ROXICET) 5-325 MG tablet oxycodone-acetaminophen 5 mg-325 mg tablet 05/25/17   [provider]  prazosin (MINIPRESS) 2 MG capsule Take by mouth.    [provider]  pregabalin (LYRICA) 200 MG capsule Take 1 capsule (200 mg total) by mouth 2 (two) times daily. 03/14/16   Ian Cory, MD  senna-docusate (SENOKOT-S) 8.6-50 MG tablet Take 1 tablet by mouth 2 (two) times daily. 11/05/17   [provider]  sildenafil (REVATIO) 20 MG tablet Take 20 mg by mouth as needed.    [provider]  Spacer/Aero-Holding Chambers (AEROCHAMBER PLUS) inhaler Use as instructed 05/20/16   Domenick Gong, MD  sulfamethoxazole-trimethoprim (BACTRIM,SEPTRA) 400-80 MG tablet sulfamethoxazole 400 mg-trimethoprim 80 mg tablet  TAKE 1 TABLET BY MOUTH TWICE A DAY FOR 10 DAYS    [provider]  tamsulosin (FLOMAX) 0.4 MG CAPS capsule tamsulosin 0.4 mg capsule 10/21/12   [provider]  Testosterone 10 MG/ACT (2%) GEL testosterone 10 mg/0.5 gram/actuation transdermal gel pump  APPLY 1 PUMP ONTO EACH INNER THIGH DAILY (2 PUMPS TOTAL A DAY)    [provider]  tiotropium (SPIRIVA) 18 MCG inhalation capsule  Place 1 capsule (18 mcg total) into inhaler and inhale daily. 03/06/17   Shane Crutch, MD  Vitamin D, Ergocalciferol, (DRISDOL) 50000 units CAPS capsule Take by mouth. 10/18/17   [provider]    Family History  Problem Relation Age of Onset  . Stroke Mother   . Diabetes Father      Social History   Tobacco Use  . Smoking status: Former Smoker    Packs/day: 0.25    Years: 30.00    Pack years: 7.50    Types: Cigarettes    Start date: 05/08/1968    Quit date: 06/17/1998    Years since quitting: 20.4  . Smokeless tobacco: Never Used  Substance Use Topics  . Alcohol use: No    Alcohol/week: 0.0 standard drinks  . Drug use: No    Allergies as of 11/14/2018 - Review Complete 11/14/2018  Allergen Reaction  Noted  . Banana Shortness Of Breath 01/21/2015  . Codeine Itching 06/18/2015  . Iodine Swelling 01/21/2015  . Peanut oil Shortness Of Breath 06/27/2017  . Ace inhibitors Cough 06/18/2015  . Gabapentin  06/18/2015  . Iodinated diagnostic agents Other (See Comments) 08/09/2012  . Metformin  06/18/2015    Review of Systems:    All systems reviewed and negative except where noted in HPI.   Physical Exam:  Vital signs in last 24 hours: Temp:  [97.6 F (36.4 C)-98.6 F (37 C)] 98.6 F (37 C) (07/10 1242) Pulse Rate:  [68-85] 85 (07/10 1242) Resp:  [17-24] 20 (07/10 0400) BP: (146-178)/(61-96) 157/61 (07/10 1242) SpO2:  [92 %-98 %] 93 % (07/10 1242) Weight:  [99.8 kg-101 kg] 101 kg (07/10 0400) Last BM Date: 11/15/18 General:   Pleasant, cooperative in NAD Head:  Normocephalic and atraumatic. Eyes:   No icterus.   Conjunctiva pink. PERRLA. Ears:  Normal auditory acuity. Neck:  Supple; no masses or thyroidomegaly Lungs: Respirations even and unlabored. Lungs clear to auscultation bilaterally.   No wheezes, crackles, or rhonchi.  Heart:  Regular rate and rhythm;  Without murmur, clicks, rubs or gallops Abdomen: Tense, positive distention, diffuse  tenderness.  Decreased bowel sounds. No appreciable masses or hepatomegaly.  No rebound or guarding.  Rectal:  Not performed. Msk:  Symmetrical without gross deformities.    Extremities:  Without edema, cyanosis or clubbing. Neurologic:  Alert and oriented x3;  grossly normal neurologically. Skin:  Intact without significant lesions or rashes. Cervical Nodes:  No significant cervical adenopathy. Psych:  Alert and cooperative. Normal affect.  LAB RESULTS: Recent Labs    11/14/18 2248  WBC 5.8  HGB 14.4  HCT 43.3  PLT 141*   BMET Recent Labs    11/14/18 2248  NA 140  K 4.0  CL 106  CO2 25  GLUCOSE 193*  BUN 17  CREATININE 1.19  CALCIUM 9.1   LFT Recent Labs    11/14/18 2248  PROT 7.5  ALBUMIN 3.8  AST 22  ALT 17  ALKPHOS 61  BILITOT 0.7   PT/INR No results for input(s): LABPROT, INR in the last 72 hours.  STUDIES: US Abdomen Limited Ruq  Result Date: 11/15/2018 CLINICAL DATA:  Right upper quadrant abdominal pain EXAM: ULTRASOUND ABDOMEN LIMITED RIGHT UPPER QUADRANT COMPARISON:  None. FINDINGS: Gallbladder: The gallbladder is distended with stones. The gallbladder wall is not thickened and measures approximately 2 mm in thickness. There is gallbladder sludge. The sonographic Eulah Pont sign is negative. Common bile duct: Diameter: 2.7 mm. Liver: Diffuse increased echogenicity with slightly heterogeneous liver. Appearance typically secondary to fatty infiltration. Fibrosis secondary consideration. No secondary findings of cirrhosis noted. No focal hepatic lesion or intrahepatic biliary duct dilatation. Portal vein is patent on color Doppler imaging with normal direction of blood flow towards the liver. IMPRESSION: 1. There is cholelithiasis without secondary signs of acute cholecystitis. 2. Diffuse increased echogenicity with slightly heterogeneous liver. Appearance typically secondary to fatty infiltration. Fibrosis secondary consideration. No secondary findings of cirrhosis  noted. No focal hepatic lesion or intrahepatic biliary duct dilatation. Electronically Signed   By: Katherine Mantle M.D.   On: 11/15/2018 02:33      Impression / Plan:   Assessment: Active Problems:   Elevated lipase   Ian Lucas is a 68 y.o. y/o male with abdominal pain and only mildly elevated lipase without any signs of pancreatitis.  The patient has been given multiple doses of narcotics and is not able  to give much history.  Despite the narcotics the patient still reports some abdominal pain.  Plan:  I would suggest this patient undergo a CT scan of the abdomen pelvis.  The patient is allergic to contrast so this would need to be done without contrast.  Pending the results of the CT scan further recommendations will be made.  Thank you for involving me in the care of this patient.      LOS: 0 days   Lucilla Lame, MD  11/15/2018, 1:09 PM    Note: This dictation was prepared with Dragon dictation along with smaller phrase technology. Any transcriptional errors that result from this process are unintentional.

## 2018-11-15 NOTE — Progress Notes (Signed)
Inpatient Diabetes Program Recommendations  AACE/ADA: New Consensus Statement on Inpatient Glycemic Control   Target Ranges:  Prepandial:   less than 140 mg/dL      Peak postprandial:   less than 180 mg/dL (1-2 hours)      Critically ill patients:  140 - 180 mg/dL  Results for Ian Lucas, Ian Lucas (MRN [id number]) as of 11/15/2018 13:32  Ref. Range 11/14/2018 22:48  Glucose Latest Ref Range: 70 - 99 mg/dL 193 (H)    Review of Glycemic Control  Diabetes history: DM2 Outpatient Diabetes medications: Lantus (no dose noted on home med list) Current orders for Inpatient glycemic control: None  Inpatient Diabetes Program Recommendations:   Insulin-Correction: Please consider ordering CBGs Q4H with Novolog 0-15 units Q4H.  NOTE: Noted Lantus on home medication list but no dosage or frequency. Per chart review, noted an office visit note by Dr. Marvel Plan on 05/22/2018 which has Lantus 25 units daily listed as DM medication. Per RN note at 12:53 today, patient is disoriented so not able to ask patient about Lantus dose.  Thanks, Barnie Alderman, RN, MSN, CDE Diabetes Coordinator Inpatient Diabetes Program [phone] (Team Pager from 8am to 5pm)

## 2018-11-15 NOTE — Anesthesia Preprocedure Evaluation (Signed)
Anesthesia Evaluation  Patient identified by MRN, date of birth, ID band Patient awake and Patient confused    Reviewed: Allergy & Precautions, H&P , NPO status , Patient's Chart, lab work & pertinent test results  History of Anesthesia Complications Negative for: history of anesthetic complications  Airway Mallampati: III  TM Distance: >3 FB Neck ROM: full    Dental  (+) Chipped, Poor Dentition   Pulmonary neg shortness of breath, asthma , sleep apnea , COPD, former smoker,           Cardiovascular Exercise Tolerance: Good hypertension, (-) angina(-) Past MI and (-) DOE      Neuro/Psych PSYCHIATRIC DISORDERS  Neuromuscular disease    GI/Hepatic negative GI ROS, Neg liver ROS, neg GERD  ,  Endo/Other  diabetes, Type 2, Insulin Dependent  Renal/GU Renal disease     Musculoskeletal   Abdominal   Peds  Hematology negative hematology ROS (+)   Anesthesia Other Findings Past Medical History: No date: Diabetes mellitus without complication (HCC) No date: Hypercholesteremia No date: Hypertension No date: Neuropathy No date: Pancreatitis No date: PTSD (post-traumatic stress disorder)  Past Surgical History: No date: BACK SURGERY No date: CIRCUMCISION; N/A No date: HERNIA REPAIR No date: HYDROCELE EXCISION / REPAIR; Left  BMI    Body Mass Index: 34.87 kg/m      Reproductive/Obstetrics negative OB ROS                             Anesthesia Physical Anesthesia Plan  ASA: III  Anesthesia Plan: General ETT   Post-op Pain Management:    Induction: Intravenous  PONV Risk Score and Plan: Ondansetron, Dexamethasone, Midazolam and Treatment may vary due to age or medical condition  Airway Management Planned: Oral ETT  Additional Equipment:   Intra-op Plan:   Post-operative Plan: Extubation in OR  Informed Consent: I have reviewed the patients History and Physical, chart, labs  and discussed the procedure including the risks, benefits and alternatives for the proposed anesthesia with the patient or authorized representative who has indicated his/her understanding and acceptance.     Dental Advisory Given  Plan Discussed with: Anesthesiologist, CRNA and Surgeon  Anesthesia Plan Comments: (Phone consent from his wife [place] Ballo,[place] [phone]   Wife consented for risks of anesthesia including but not limited to:  - adverse reactions to medications - damage to teeth, lips or other oral mucosa - sore throat or hoarseness - Damage to heart, brain, lungs or loss of life  Wife voiced understanding.)        Anesthesia Quick Evaluation

## 2018-11-15 NOTE — Progress Notes (Signed)
Pharmacy Antibiotic Note  Ian Lucas is a 68 y.o. male admitted on 11/14/2018 with IAI.  Pharmacy has been consulted for Zosyn dosing.  Plan: Zosyn 3.375g IV q8h (4 hour infusion).  Height: 5\' 7"  (170.2 cm) Weight: 222 lb 10.6 oz (101 kg) IBW/kg (Calculated) : 66.1  Temp (24hrs), Avg:98.4 F (36.9 C), Min:97.6 F (36.4 C), Max:98.6 F (37 C)  Recent Labs  Lab 11/14/18 2248  WBC 5.8  CREATININE 1.19    Estimated Creatinine Clearance: 66.4 mL/min (by C-G formula based on SCr of 1.19 mg/dL).    Allergies  Allergen Reactions  . Banana Shortness Of Breath  . Codeine Itching  . Iodine Swelling    Pt states 30-40 years ago when he had x-ray dye his lips swelled and he threw up. SPM  . Peanut Oil Shortness Of Breath  . Ace Inhibitors Cough  . Gabapentin Other (See Comments)    Anxious and jumpy   . Iodinated Diagnostic Agents Other (See Comments)  . Metformin Diarrhea    Antimicrobials this admission: Zosyn 7/10 >>  Dose adjustments this admission:   Microbiology results:   Thank you for allowing pharmacy to be a part of this patient's care.  Rocky Morel 11/15/2018 4:07 PM

## 2018-11-15 NOTE — Consult Note (Signed)
Patient ID: Ian Lucas, male   DOB: December 22, 1950, 68 y.o.   MRN: [id number]  HPI Ian Lucas is a 68 y.o. male seen in consultation at the request of Dr. Juliene Pina, case d/w her in detail.  He was admitted today with abdominal pain that was severe constant sharp  located in the right upper quadrant.  He does have associated nausea but no vomiting.  No fevers no chills.  He continues to have severe abdominal pain the right upper quadrant despite narcotic use.  Denies any biliary obstruction no obstructive jaundice.  No fevers no chills.  CT scan of the abdomen pelvis as well as ultrasound personally reviewed showing evidence of cholecystitis with distended gallbladder.  Evidence of cholelithiasis with normal common bile duct.  Lab work showed only mild reactive elevated lipase, normal CBC and a normal CMP except hyperglycemia.  Lipase is mildly elevated. He also had bilateral inguinal hernias and umbilical hernia on the CT  HPI  Past Medical History:  Diagnosis Date  . Diabetes mellitus without complication (HCC)   . Hypercholesteremia   . Hypertension   . Neuropathy   . Pancreatitis   . PTSD (post-traumatic stress disorder)     Past Surgical History:  Procedure Laterality Date  . BACK SURGERY    . CIRCUMCISION N/A   . HERNIA REPAIR    . HYDROCELE EXCISION / REPAIR Left     Family History  Problem Relation Age of Onset  . Stroke Mother   . Diabetes Father     Social History Social History   Tobacco Use  . Smoking status: Former Smoker    Packs/day: 0.25    Years: 30.00    Pack years: 7.50    Types: Cigarettes    Start date: 05/08/1968    Quit date: 06/17/1998    Years since quitting: 20.4  . Smokeless tobacco: Never Used  Substance Use Topics  . Alcohol use: No    Alcohol/week: 0.0 standard drinks  . Drug use: No    Allergies  Allergen Reactions  . Banana Shortness Of Breath  . Codeine Itching  . Iodine Swelling    Pt states 30-40 years ago when he had x-ray dye  his lips swelled and he threw up. SPM  . Peanut Oil Shortness Of Breath  . Ace Inhibitors Cough  . Gabapentin Other (See Comments)    Anxious and jumpy   . Iodinated Diagnostic Agents Other (See Comments)  . Metformin Diarrhea    Current Facility-Administered Medications  Medication Dose Route Frequency Provider Last Rate Last Dose  . 0.9 %  sodium chloride infusion   Intravenous Continuous Arnaldo Natal, MD 125 mL/hr at 11/15/18 1734    . acetaminophen (TYLENOL) tablet 650 mg  650 mg Oral Q6H PRN Arnaldo Natal, MD       Or  . acetaminophen (TYLENOL) suppository 650 mg  650 mg Rectal Q6H PRN Arnaldo Natal, MD      . docusate sodium (COLACE) capsule 100 mg  100 mg Oral BID Arnaldo Natal, MD      . HYDROmorphone (DILAUDID) injection 0.5 mg  0.5 mg Intravenous Q3H PRN Arnaldo Natal, MD   0.5 mg at 11/15/18 1025  . insulin aspart (novoLOG) injection 0-15 Units  0-15 Units Subcutaneous Q4H Mody, Sital, MD      . ketorolac (TORADOL) 30 MG/ML injection 30 mg  30 mg Intravenous Q6H PRN Jayke Caul, Merri Ray, MD      . morphine  2 MG/ML injection 2 mg  2 mg Intravenous Q4H PRN Harrie Foreman, MD   2 mg at 11/15/18 0324  . ondansetron (ZOFRAN) tablet 4 mg  4 mg Oral Q6H PRN Harrie Foreman, MD       Or  . ondansetron [facility]) injection 4 mg  4 mg Intravenous Q6H PRN Harrie Foreman, MD      . piperacillin-tazobactam (ZOSYN) IVPB 3.375 g  3.375 g Intravenous Q8H Rocky Morel, RPH 12.5 mL/hr at 11/15/18 1735 3.375 g at 11/15/18 1735     Review of Systems Full ROS  was asked and was negative except for the information on the HPI  Physical Exam Blood pressure (!) 153/89, pulse 83, temperature 98.6 F (37 C), temperature source Oral, resp. rate 16, height 5\' 7"  (1.702 m), weight 101 kg, SpO2 97 %. CONSTITUTIONAL: NAD, alert  EYES: Pupils are equal, round, and reactive to light, Sclera are non-icteric. EARS, NOSE, MOUTH AND THROAT: The oropharynx is clear. The oral  mucosa is pink and moist. Hearing is intact to voice. LYMPH NODES:  Lymph nodes in the neck are normal. RESPIRATORY:  Lungs are clear. There is normal respiratory effort, with equal breath sounds bilaterally, and without pathologic use of accessory muscles. CARDIOVASCULAR: Heart is regular without murmurs, gallops, or rubs. GI: The abdomen is  soft, tender to palpation RUQ, + Murphy sign. No peritonitis, Diastasis recti and small UH GU: Rectal deferred.   MUSCULOSKELETAL: Normal muscle strength and tone. No cyanosis or edema.   SKIN: Turgor is good and there are no pathologic skin lesions or ulcers. NEUROLOGIC: Motor and sensation is grossly normal. Cranial nerves are grossly intact. PSYCH:  Oriented to person, place and time. Affect is normal.  Data Reviewed  I have personally reviewed the patient's imaging, laboratory findings and medical records.    Assessment/Plan 68 year old male with clinical findings consistent with acute cholecystitis.  Lipase might be more reactive than anything else.  There is no evidence of necrotizing peritonitis or evidence of pancreatitis on the CT.  I have discussed in detail with Dr. Allen Norris GI and he agrees with me that this is mainly a gallbladder issue rather than pancreas. Given the significant pain I do recommend proceeding with laparoscopic cholecystectomy.The risks, benefits, complications, treatment options, and expected outcomes were discussed with the patient. The possibilities of bleeding, recurrent infection, finding a normal gallbladder, perforation of viscus organs, damage to surrounding structures, bile leak, abscess formation, needing a drain placed, the need for additional procedures, reaction to medication, pulmonary aspiration,  failure to diagnose a condition, the possible need to convert to an open procedure, and creating a complication requiring transfusion or operation were discussed with the patient. The patient and/or family concurred with the  proposed plan, giving informed consent.  D/W wife in detail and she understands and agrees  Caroleen Hamman, MD [place] Surgeon 11/15/2018, 6:04 PM

## 2018-11-15 NOTE — Op Note (Signed)
Laparoscopic Cholecystectomy  Pre-operative Diagnosis: Cholecystitis  Post-operative Diagnosis: same  Procedure: lap cholecystectomy  Surgeon: Caroleen Hamman, MD FACS  Anesthesia: Gen. with endotracheal tube  Findings: Gangrenous Cholecystitis   Estimated Blood Loss: 75cc         Drains: 15FR RUQ         Specimens: Gallbladder           Complications: none   Procedure Details  The patient was seen again in the Holding Room. The benefits, complications, treatment options, and expected outcomes were discussed with the patient. The risks of bleeding, infection, recurrence of symptoms, failure to resolve symptoms, bile duct damage, bile duct leak, retained common bile duct stone, bowel injury, any of which could require further surgery and/or ERCP, stent, or papillotomy were reviewed with the patient. The likelihood of improving the patient's symptoms with return to their baseline status is good.  The patient and/or family concurred with the proposed plan, giving informed consent.  The patient was taken to Operating Room, identified as Ian Lucas and the procedure verified as Laparoscopic Cholecystectomy.  A Time Out was held and the above information confirmed.  Prior to the induction of general anesthesia, antibiotic prophylaxis was administered. VTE prophylaxis was in place. General endotracheal anesthesia was then administered and tolerated well. After the induction, the abdomen was prepped with Chloraprep and draped in the sterile fashion. The patient was positioned in the supine position.  Cut down technique was used to enter the abdominal cavity and a Hasson trochar was placed after two vicryl stitches were anchored to the fascia. Pneumoperitoneum was then created with CO2 and tolerated well without any adverse changes in the patient's vital signs.  Three 5-mm ports were placed in the right upper quadrant all under direct vision. All skin incisions  were infiltrated with a local  anesthetic agent before making the incision and placing the trocars.  Due to inflammation and difficult case I placed a fifth laparoscopic 5 mm port.  The patient was positioned  in reverse Trendelenburg, tilted slightly to the patient's left.  The gallbladder was identified, the fundus grasped and retracted cephalad. GB was gangrenous, We aspirated bile to be able to manipulate the GB. Adhesions were lysed bluntly. The infundibulum was grasped and retracted laterally, exposing the peritoneum overlying the triangle of Calot. This was then divided and exposed in a blunt fashion. An extended critical view of the cystic duct and cystic artery was obtained.  The cystic duct was clearly identified and bluntly dissected.   Artery and duct were double clipped and divided.  The gallbladder was taken from the gallbladder fossa in a retrograde fashion with the electrocautery. The gallbladder was removed and placed in an Endocatch bag. The liver bed was irrigated and inspected. Hemostasis was achieved with the electrocautery, Raw surfaces visualized due to severe inflammatory response. Arista was placed..   The gallbladder and Endocatch sac were then removed through a port site.    15 FR drain placed GB fossa.  Inspection of the right upper quadrant was performed. No bleeding, bile duct injury or leak, or bowel injury was noted. Pneumoperitoneum was released.  The periumbilical port site was closed with interrumpted 0 Vicryl sutures. 4-0 subcuticular Monocryl was used to close the skin. Dermabond was  applied.  The patient was then extubated and brought to the recovery room in stable condition. Sponge, lap, and needle counts were correct at closure and at the conclusion of the case.  Caroleen Hamman, MD, FACS

## 2018-11-15 NOTE — H&P (Signed)
Ian Lucas is an 68 y.o. male.   Chief Complaint: Abdominal pain HPI: The patient with past medical history of diabetes, hypercholesterolemia and hypertension presents to the emergency department complaining of abdominal pain.  The patient states that his pain began earlier this afternoon after working in his yard.  He denies chest pain or shortness of breath but admits to nausea.  The patient reports that this pain is the worst he has experienced.  He denies diarrhea or blood in his stool.  Amatory evaluation revealed lipase of 92 and an ultrasound of his right upper quadrant showed pancreatitis as well as cholelithiasis without cholecystitis.  Despite multiple doses of IV narcotic and antiemetic the patient continued to have pain and nausea which prompted the emergency department staff to call the hospitalist service for admission.  Past Medical History:  Diagnosis Date  . Diabetes mellitus without complication (HCC)   . Hypercholesteremia   . Hypertension   . Neuropathy   . PTSD (post-traumatic stress disorder)     Past Surgical History:  Procedure Laterality Date  . BACK SURGERY    . CIRCUMCISION N/A   . HERNIA REPAIR    . HYDROCELE EXCISION / REPAIR Left     Family History  Problem Relation Age of Onset  . Stroke Mother   . Diabetes Father    Social History:  reports that he quit smoking about 20 years ago. His smoking use included cigarettes. He started smoking about 50 years ago. He has a 7.50 pack-year smoking history. He has never used smokeless tobacco. He reports that he does not drink alcohol or use drugs.  Allergies:  Allergies  Allergen Reactions  . Banana Shortness Of Breath  . Codeine Itching  . Iodine Swelling    Pt states 30-40 years ago when he had x-ray dye his lips swelled and he threw up. SPM  . Peanut Oil Shortness Of Breath  . Ace Inhibitors Cough  . Gabapentin Other (See Comments)    Anxious and jumpy   . Iodinated Diagnostic Agents Other (See  Comments)  . Metformin Diarrhea    Medications Prior to Admission  Medication Sig Dispense Refill  . acyclovir ointment (ZOVIRAX) 5 % acyclovir 5 % topical ointment  1 (ONE) OINTMENT, EXTERNAL, APPLY TO RASH FOUR TIMES DAILY    . albuterol (PROVENTIL HFA;VENTOLIN HFA) 108 (90 Base) MCG/ACT inhaler Inhale 2 puffs into the lungs every 4 (four) hours as needed for wheezing or shortness of breath. 1 Inhaler 1  . Alogliptin Benzoate 25 MG TABS Take by mouth.    . ALPRAZolam (XANAX) 0.5 MG tablet Take 1 tablet 30 minutes prior to procedure.    Marland Kitchen. amLODipine (NORVASC) 10 MG tablet Take by mouth.    Marland Kitchen. aspirin 81 MG tablet Take 81 mg by mouth daily.    Marland Kitchen. atorvastatin (LIPITOR) 20 MG tablet Take by mouth.    Marland Kitchen. azithromycin (ZITHROMAX) 250 MG tablet Take 1 tablet (250 mg total) by mouth daily. Take first 2 tablets together, then 1 every day until finished. 6 tablet 0  . capsaicin (ZOSTRIX) 0.025 % cream Apply topically.    . carvedilol (COREG) 25 MG tablet Take 25 mg by mouth 2 (two) times daily with a meal.     . co-enzyme Q-10 30 MG capsule Take 100 mg by mouth daily.    . DULoxetine (CYMBALTA) 60 MG capsule Take by mouth.    Marland Kitchen. HYDROcodone-acetaminophen (NORCO/VICODIN) 5-325 MG tablet 1-2 tabs po bid prn 8 tablet 0  .  hydroxychloroquine (PLAQUENIL) 200 MG tablet Take by mouth.    Marland Kitchen ibuprofen (ADVIL,MOTRIN) 600 MG tablet Take 1 tablet (600 mg total) by mouth every 6 (six) hours as needed. 30 tablet 0  . insulin glargine (LANTUS) 100 UNIT/ML injection Inject into the skin.    Marland Kitchen lidocaine (XYLOCAINE) 5 % ointment Apply 2-4 grams(1-2 inch ribbon) to affected areas up to three times a day as needed for pain.    Marland Kitchen losartan (COZAAR) 100 MG tablet Take by mouth.    . metaxalone (SKELAXIN) 800 MG tablet Take 1 tablet (800 mg total) by mouth 3 (three) times daily as needed for muscle spasms. 21 tablet 0  . methocarbamol (ROBAXIN) 500 MG tablet TAKE 1 TABLET (500 MG TOTAL) BY MOUTH 3 (THREE) TIMES DAILY AS  NEEDED (MUSCLE SPASMS)  0  . naproxen (NAPROSYN) 250 MG tablet naproxen 250 mg tablet    . orphenadrine (NORFLEX) 100 MG tablet orphenadrine citrate ER 100 mg tablet,extended release  TAKE 1 TABLET TWICE A DAY    . oxyCODONE (OXY IR/ROXICODONE) 5 MG immediate release tablet TAKE 1 TABLET (5 MG TOTAL) BY MOUTH EVERY 4 (FOUR) HOURS AS NEEDED FOR PAIN  0  . oxyCODONE-acetaminophen (PERCOCET/ROXICET) 5-325 MG tablet oxycodone-acetaminophen 5 mg-325 mg tablet    . prazosin (MINIPRESS) 2 MG capsule Take by mouth.    . pregabalin (LYRICA) 200 MG capsule Take 1 capsule (200 mg total) by mouth 2 (two) times daily. 60 capsule 5  . senna-docusate (SENOKOT-S) 8.6-50 MG tablet Take 1 tablet by mouth 2 (two) times daily.  0  . sildenafil (REVATIO) 20 MG tablet Take 20 mg by mouth as needed.    Marland Kitchen Spacer/Aero-Holding Chambers (AEROCHAMBER PLUS) inhaler Use as instructed 1 each 2  . sulfamethoxazole-trimethoprim (BACTRIM,SEPTRA) 400-80 MG tablet sulfamethoxazole 400 mg-trimethoprim 80 mg tablet  TAKE 1 TABLET BY MOUTH TWICE A DAY FOR 10 DAYS    . tamsulosin (FLOMAX) 0.4 MG CAPS capsule tamsulosin 0.4 mg capsule    . Testosterone 10 MG/ACT (2%) GEL testosterone 10 mg/0.5 gram/actuation transdermal gel pump  APPLY 1 PUMP ONTO EACH INNER THIGH DAILY (2 PUMPS TOTAL A DAY)    . tiotropium (SPIRIVA) 18 MCG inhalation capsule Place 1 capsule (18 mcg total) into inhaler and inhale daily. 30 capsule 6  . Vitamin D, Ergocalciferol, (DRISDOL) 50000 units CAPS capsule Take by mouth.      Results for orders placed or performed during the hospital encounter of 11/14/18 (from the past 48 hour(s))  CBC with Differential     Status: Abnormal   Collection Time: 11/14/18 10:48 PM  Result Value Ref Range   WBC 5.8 4.0 - 10.5 K/uL   RBC 4.93 4.22 - 5.81 MIL/uL   Hemoglobin 14.4 13.0 - 17.0 g/dL   HCT 40.9 81.1 - 91.4 %   MCV 87.8 80.0 - 100.0 fL   MCH 29.2 26.0 - 34.0 pg   MCHC 33.3 30.0 - 36.0 g/dL   RDW 78.2 95.6 - 21.3  %   Platelets 141 (L) 150 - 400 K/uL   nRBC 0.0 0.0 - 0.2 %   Neutrophils Relative % 57 %   Neutro Abs 3.3 1.7 - 7.7 K/uL   Lymphocytes Relative 33 %   Lymphs Abs 1.9 0.7 - 4.0 K/uL   Monocytes Relative 8 %   Monocytes Absolute 0.5 0.1 - 1.0 K/uL   Eosinophils Relative 2 %   Eosinophils Absolute 0.1 0.0 - 0.5 K/uL   Basophils Relative 0 %  Basophils Absolute 0.0 0.0 - 0.1 K/uL   Immature Granulocytes 0 %   Abs Immature Granulocytes 0.02 0.00 - 0.07 K/uL    Comment: Performed at [facility], [address] [place], [place] [address]  Comprehensive metabolic panel     Status: Abnormal   Collection Time: 11/14/18 10:48 PM  Result Value Ref Range   Sodium 140 135 - 145 mmol/L   Potassium 4.0 3.5 - 5.1 mmol/L   Chloride 106 98 - 111 mmol/L   CO2 25 22 - 32 mmol/L   Glucose, Bld 193 (H) 70 - 99 mg/dL   BUN 17 8 - 23 mg/dL   Creatinine, Ser 1.19 0.61 - 1.24 mg/dL   Calcium 9.1 8.9 - 10.3 mg/dL   Total Protein 7.5 6.5 - 8.1 g/dL   Albumin 3.8 3.5 - 5.0 g/dL   AST 22 15 - 41 U/L   ALT 17 0 - 44 U/L   Alkaline Phosphatase 61 38 - 126 U/L   Total Bilirubin 0.7 0.3 - 1.2 mg/dL   GFR calc non Af Amer >60 >60 mL/min   GFR calc Af Amer >60 >60 mL/min   Anion gap 9 5 - 15    Comment: Performed at [facility], [address] [place], [place] [address]  Lipase, blood     Status: Abnormal   Collection Time: 11/14/18 10:48 PM  Result Value Ref Range   Lipase 92 (H) 11 - 51 U/L    Comment: Performed at [facility], [place], [place] [address]  Troponin I (High Sensitivity)     Status: None   Collection Time: 11/14/18 10:48 PM  Result Value Ref Range   Troponin I (High Sensitivity) 6 <18 ng/L    Comment: (NOTE) Elevated high sensitivity troponin I (hsTnI) values and significant  changes across serial measurements may suggest ACS but many other  chronic and acute conditions are known to elevate hsTnI results.  Refer to the "Links" section  for chest pain algorithms and additional  guidance. Performed at [facility], [address], [place], [place] [address]   SARS Coronavirus 2 (CEPHEID - Performed in [facility] hospital lab), Hosp Order     Status: None   Collection Time: 11/14/18 10:48 PM   Specimen: Nasopharyngeal Swab  Result Value Ref Range   SARS Coronavirus 2 NEGATIVE NEGATIVE    Comment: (NOTE) If result is NEGATIVE SARS-CoV-2 target nucleic acids are NOT DETECTED. The SARS-CoV-2 RNA is generally detectable in upper and lower  respiratory specimens during the acute phase of infection. The lowest  concentration of SARS-CoV-2 viral copies this assay can detect is 250  copies / mL. A negative result does not preclude SARS-CoV-2 infection  and should not be used as the sole basis for treatment or other  patient management decisions.  A negative result may occur with  improper specimen collection / handling, submission of specimen other  than nasopharyngeal swab, presence of viral mutation(s) within the  areas targeted by this assay, and inadequate number of viral copies  (<250 copies / mL). A negative result must be combined with clinical  observations, patient history, and epidemiological information. If result is POSITIVE SARS-CoV-2 target nucleic acids are DETECTED. The SARS-CoV-2 RNA is generally detectable in upper and lower  respiratory specimens dur ing the acute phase of infection.  Positive  results are indicative of active infection with SARS-CoV-2.  Clinical  correlation with patient history and other diagnostic information is  necessary to determine patient infection status.  Positive results do  not rule out bacterial infection or co-infection with other viruses. If result is PRESUMPTIVE POSTIVE SARS-CoV-2 nucleic acids MAY BE PRESENT.   A presumptive positive result was obtained on the submitted specimen  and confirmed on repeat testing.  While 2019 novel coronavirus  (SARS-CoV-2)  nucleic acids may be present in the submitted sample  additional confirmatory testing may be necessary for epidemiological  and / or clinical management purposes  to differentiate between  SARS-CoV-2 and other Sarbecovirus currently known to infect humans.  If clinically indicated additional testing with an alternate test  methodology [phone]) is advised. The SARS-CoV-2 RNA is generally  detectable in upper and lower respiratory sp ecimens during the acute  phase of infection. The expected result is Negative. Fact Sheet for Patients:  BoilerBrush.com.cy Fact Sheet for Healthcare Providers: https://pope.com/ This test is not yet approved or cleared by the [place] FDA and has been authorized for detection and/or diagnosis of SARS-CoV-2 by FDA under an Emergency Use Authorization (EUA).  This EUA will remain in effect (meaning this test can be used) for the duration of the COVID-19 declaration under Section 564(b)(1) of the Act, 21 U.S.C. section 360bbb-3(b)(1), unless the authorization is terminated or revoked sooner. Performed at [facility], [address] [place], [place] [address]   Lipid panel     Status: Abnormal   Collection Time: 11/15/18  5:44 AM  Result Value Ref Range   Cholesterol 202 (H) 0 - 200 mg/dL   Triglycerides 57 <956 mg/dL   HDL 62 >21 mg/dL   Total CHOL/HDL Ratio 3.3 RATIO   VLDL 11 0 - 40 mg/dL   LDL Cholesterol 308 (H) 0 - 99 mg/dL    Comment:        Total Cholesterol/HDL:CHD Risk Coronary Heart Disease Risk Table                     Men   Women  1/2 Average Risk   3.4   3.3  Average Risk       5.0   4.4  2 X Average Risk   9.6   7.1  3 X Average Risk  23.4   11.0        Use the calculated Patient Ratio above and the CHD Risk Table to determine the patient's CHD Risk.        ATP III CLASSIFICATION (LDL):  <100     mg/dL   Optimal  657-846  mg/dL   Near or Above                     Optimal  130-159  mg/dL   Borderline  962-952  mg/dL   High  >841     mg/dL   Very High Performed at [facility], [address] [place], [place] [address]   TSH     Status: None   Collection Time: 11/15/18  5:44 AM  Result Value Ref Range   TSH 2.321 0.350 - 4.500 uIU/mL    Comment: Performed by a 3rd Generation assay with a functional sensitivity of <=0.01 uIU/mL. Performed at [facility], [address] [place], [place] [address]    US Abdomen Limited Ruq  Result Date: 11/15/2018 CLINICAL DATA:  Right upper quadrant abdominal pain EXAM: ULTRASOUND ABDOMEN LIMITED RIGHT UPPER QUADRANT COMPARISON:  None. FINDINGS: Gallbladder: The gallbladder is distended with stones. The gallbladder wall is not  thickened and measures approximately 2 mm in thickness. There is gallbladder sludge. The sonographic Eulah PontMurphy sign is negative. Common bile duct: Diameter: 2.7 mm. Liver: Diffuse increased echogenicity with slightly heterogeneous liver. Appearance typically secondary to fatty infiltration. Fibrosis secondary consideration. No secondary findings of cirrhosis noted. No focal hepatic lesion or intrahepatic biliary duct dilatation. Portal vein is patent on color Doppler imaging with normal direction of blood flow towards the liver. IMPRESSION: 1. There is cholelithiasis without secondary signs of acute cholecystitis. 2. Diffuse increased echogenicity with slightly heterogeneous liver. Appearance typically secondary to fatty infiltration. Fibrosis secondary consideration. No secondary findings of cirrhosis noted. No focal hepatic lesion or intrahepatic biliary duct dilatation. Electronically Signed   By: Katherine Mantlehristopher  Green M.D.   On: 11/15/2018 02:33    Review of Systems  Constitutional: Negative for chills and fever.  HENT: Negative for sore throat and tinnitus.   Eyes: Negative for blurred vision and redness.  Respiratory: Negative for cough and shortness of breath.    Cardiovascular: Negative for chest pain, palpitations, orthopnea and PND.  Gastrointestinal: Positive for abdominal pain and nausea. Negative for diarrhea and vomiting.  Genitourinary: Negative for dysuria, frequency and urgency.  Musculoskeletal: Negative for joint pain and myalgias.  Skin: Negative for rash.       No lesions  Neurological: Negative for speech change, focal weakness and weakness.  Endo/Heme/Allergies: Does not bruise/bleed easily.       No temperature intolerance  Psychiatric/Behavioral: Negative for depression and suicidal ideas.    Blood pressure (!) 172/96, pulse 81, temperature 97.6 F (36.4 C), temperature source Oral, resp. rate 20, height 5\' 7"  (1.702 m), weight 101 kg, SpO2 96 %. Physical Exam  Vitals reviewed. Constitutional: He is oriented to person, place, and time. He appears well-developed and well-nourished. No distress.  HENT:  Head: Normocephalic and atraumatic.  Mouth/Throat: Oropharynx is clear and moist.  Eyes: Pupils are equal, round, and reactive to light. Conjunctivae and EOM are normal. No scleral icterus.  Neck: Normal range of motion. Neck supple. No JVD present. No tracheal deviation present. No thyromegaly present.  Cardiovascular: Normal rate, regular rhythm and normal heart sounds. Exam reveals no gallop and no friction rub.  No murmur heard. Respiratory: Effort normal and breath sounds normal. No respiratory distress.  GI: He exhibits no distension and no mass. There is abdominal tenderness. There is no rebound and no guarding.  Genitourinary:    Genitourinary Comments: Deferred   Musculoskeletal: Normal range of motion.        General: No edema.  Lymphadenopathy:    He has no cervical adenopathy.  Neurological: He is alert and oriented to person, place, and time. No cranial nerve deficit.  Skin: Skin is warm and dry. No rash noted. No erythema.  Psychiatric: He has a normal mood and affect. His behavior is normal. Judgment and  thought content normal.     Assessment/Plan This is a 68 year old male admitted for abdominal pain. 1.  Abdominal pain: Rule out pancreatitis.  Review ultrasound to evaluate common bile duct.  Consult gastroenterology.  IV morphine as needed for severe pain. 2.  Cholelithiasis: Without cholecystitis.  Liver enzymes normal; continue to monitor. 3.  Elevated lipase: The patient does not yet meet strict criteria for pancreatitis.  Elevated lipase may indicate a passed gallstone.  Continue to monitor. 4.  Hypertension: Uncontrolled; continue amlodipine, carvedilol and losartan.  I believe the patient has missed some doses of medication due to abdominal pain and nausea.  Labetalol as needed  5.  Diabetes mellitus type 2: Continue basal insulin and add sliding scale insulin. 6.  Hyperlipidemia: Continue statin therapy 7.  BPH: Continue tamsulosin and prazosin 8.  DVT prophylaxis: Start Lovenox once we are sure the patient will not require invasive procedures 9.  GI prophylaxis: None The patient is a full code.  Time spent on admission orders and patient care approximately 45 minutes  Arnaldo Natal, MD 11/15/2018, 7:19 AM

## 2018-11-15 NOTE — ED Provider Notes (Signed)
[facility] Emergency Department Provider Note  ____________________________________________   First MD Initiated Contact with Patient 11/14/18 2256     (approximate)  I have reviewed the triage vital signs and the nursing notes.   HISTORY  Chief Complaint Abdominal Pain and Chest Pain    HPI Ian Lucas is a 68 y.o. male with below list of previous medical conditions including pancreatitis presents to the emergency department with 10 out of 10 epigastric abdominal pain with associated nausea and vomiting with onset 1 hour before arrival.  Patient denies any fever.  Patient denies any diarrhea no constipation.  Patient denies any EtOH ingestion.        Past Medical History:  Diagnosis Date  . Diabetes mellitus without complication (HCC)   . Hypercholesteremia   . Hypertension   . Neuropathy   . PTSD (post-traumatic stress disorder)     Patient Active Problem List   Diagnosis Date Noted  . Elevated lipase 11/15/2018  . Chronic pain syndrome 03/08/2016  . 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 Elevated prostate specific antigen (PSA) 06/18/2015  . Benign essential HTN 06/18/2015  . Chronic midline low back pain with bilateral sciatica 06/18/2015  . Diabetic neuropathy (HCC) 06/18/2015  . Posttraumatic stress disorder 06/18/2015  . Dyslipidemia 06/18/2015  . Lichen simplex 06/18/2015  . History of herpes zoster 06/18/2015  . Restless leg 06/18/2015  . OSA (obstructive sleep apnea) 06/18/2015  . Polyneuropathy 06/18/2015  . 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 Calculus of kidney 08/21/2013  . History of pancreatitis 08/15/2013  . Hydrocele 05/19/2013  . Arthritis of knee, degenerative 10/30/2012  . Lumbar canal stenosis 10/30/2012  . Chronic prostatitis 06/19/2012  . Hypogonadism in male 06/19/2012  . 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 Benign prostatic hyperplasia with urinary  obstruction 06/19/2012    Past Surgical History:  Procedure Laterality Date  . BACK SURGERY    . CIRCUMCISION N/A   . HERNIA REPAIR    . HYDROCELE EXCISION / REPAIR Left     Prior to Admission medications   Medication Sig Start Date End Date Taking? Authorizing Provider  acyclovir ointment (ZOVIRAX) 5 % acyclovir 5 % topical ointment  1 (ONE) OINTMENT, EXTERNAL, APPLY TO RASH FOUR TIMES DAILY    [provider]  albuterol (PROVENTIL HFA;VENTOLIN HFA) 108 (90 Base) MCG/ACT inhaler Inhale 2 puffs into the lungs every 4 (four) hours as needed for wheezing or shortness of breath. 12/23/17   Arnaldo Natal, MD  Alogliptin Benzoate 25 MG TABS Take by mouth.    [provider]  ALPRAZolam Prudy Feeler) 0.5 MG tablet Take 1 tablet 30 minutes prior to procedure. 05/25/17   [provider]  amLODipine (NORVASC) 10 MG tablet Take by mouth.    [provider]  aspirin 81 MG tablet Take 81 mg by mouth daily.    [provider]  atorvastatin (LIPITOR) 20 MG tablet Take by mouth.    [provider]  azithromycin (ZITHROMAX) 250 MG tablet Take 1 tablet (250 mg total) by mouth daily. Take first 2 tablets together, then 1 every day until finished. 12/23/17   Arnaldo Natal, MD  capsaicin (ZOSTRIX) 0.025 % cream Apply topically.    [provider]  carvedilol (COREG) 25 MG tablet Take 25 mg by mouth 2 (two) times daily with a meal.     [provider]  co-enzyme Q-10 30 MG capsule Take 100 mg by mouth daily.  [provider]  DULoxetine (CYMBALTA) 60 MG capsule Take by mouth.    [provider]  HYDROcodone-acetaminophen (NORCO/VICODIN) 5-325 MG tablet 1-2 tabs po bid prn 12/08/16   Payton Mccallumonty, [place], MD  hydroxychloroquine (PLAQUENIL) 200 MG tablet Take by mouth.    [provider]  ibuprofen (ADVIL,MOTRIN) 600 MG tablet Take 1 tablet (600 mg total) by mouth every 6 (six) hours as needed. 05/20/16   Domenick GongMortenson,  Ashley, MD  insulin glargine (LANTUS) 100 UNIT/ML injection Inject into the skin. 11/06/17 11/06/18  [provider]  lidocaine (XYLOCAINE) 5 % ointment Apply 2-4 grams(1-2 inch ribbon) to affected areas up to three times a day as needed for pain. 03/08/16   [provider]  losartan (COZAAR) 100 MG tablet Take by mouth. 06/19/12   [provider]  metaxalone (SKELAXIN) 800 MG tablet Take 1 tablet (800 mg total) by mouth 3 (three) times daily as needed for muscle spasms. 12/23/17   Arnaldo Nataliamond, Michael S, MD  methocarbamol (ROBAXIN) 500 MG tablet TAKE 1 TABLET (500 MG TOTAL) BY MOUTH 3 (THREE) TIMES DAILY AS NEEDED (MUSCLE SPASMS) 11/13/17   [provider]  naproxen (NAPROSYN) 250 MG tablet naproxen 250 mg tablet    [provider]  orphenadrine (NORFLEX) 100 MG tablet orphenadrine citrate ER 100 mg tablet,extended release  TAKE 1 TABLET TWICE A DAY    [provider]  oxyCODONE (OXY IR/ROXICODONE) 5 MG immediate release tablet TAKE 1 TABLET (5 MG TOTAL) BY MOUTH EVERY 4 (FOUR) HOURS AS NEEDED FOR PAIN 12/13/17   [provider]  oxyCODONE-acetaminophen (PERCOCET/ROXICET) 5-325 MG tablet oxycodone-acetaminophen 5 mg-325 mg tablet 05/25/17   [provider]  prazosin (MINIPRESS) 2 MG capsule Take by mouth.    [provider]  pregabalin (LYRICA) 200 MG capsule Take 1 capsule (200 mg total) by mouth 2 (two) times daily. 03/14/16   Alba CorySowles, Krichna, MD  senna-docusate (SENOKOT-S) 8.6-50 MG tablet Take 1 tablet by mouth 2 (two) times daily. 11/05/17   [provider]  sildenafil (REVATIO) 20 MG tablet Take 20 mg by mouth as needed.    [provider]  Spacer/Aero-Holding Chambers (AEROCHAMBER PLUS) inhaler Use as instructed 05/20/16   Domenick GongMortenson, Ashley, MD  sulfamethoxazole-trimethoprim (BACTRIM,SEPTRA) 400-80 MG tablet sulfamethoxazole 400 mg-trimethoprim 80 mg tablet  TAKE 1 TABLET BY MOUTH TWICE A DAY FOR 10 DAYS     [provider]  tamsulosin (FLOMAX) 0.4 MG CAPS capsule tamsulosin 0.4 mg capsule 10/21/12   [provider]  Testosterone 10 MG/ACT (2%) GEL testosterone 10 mg/0.5 gram/actuation transdermal gel pump  APPLY 1 PUMP ONTO EACH INNER THIGH DAILY (2 PUMPS TOTAL A DAY)    [provider]  tiotropium (SPIRIVA) 18 MCG inhalation capsule Place 1 capsule (18 mcg total) into inhaler and inhale daily. 03/06/17   Shane Crutchamachandran, Pradeep, MD  Vitamin D, Ergocalciferol, (DRISDOL) 50000 units CAPS capsule Take by mouth. 10/18/17   [provider]    Allergies Banana, Codeine, Iodine, Peanut oil, Ace inhibitors, Gabapentin, Iodinated diagnostic agents, and Metformin  Family History  Problem Relation Age of Onset  . Stroke Mother   . Diabetes Father     Social History Social History   Tobacco Use  . Smoking status: Former Smoker    Packs/day: 0.25    Years: 30.00    Pack years: 7.50    Types: Cigarettes    Start date: 05/08/1968    Quit date: 06/17/1998    Years since quitting: 20.4  . Smokeless  tobacco: Never Used  Substance Use Topics  . Alcohol use: No    Alcohol/week: 0.0 standard drinks  . Drug use: No    Review of Systems Constitutional: No fever/chills Eyes: No visual changes. ENT: No sore throat. Cardiovascular: Denies chest pain. Respiratory: Denies shortness of breath. Gastrointestinal: Positive for abdominal pain nausea and vomiting no diarrhea.  No constipation. Genitourinary: Negative for dysuria. Musculoskeletal: Negative for neck pain.  Negative for back pain. Integumentary: Negative for rash. Neurological: Negative for headaches, focal weakness or numbness. ___________   PHYSICAL EXAM:  VITAL SIGNS: ED Triage Vitals  Enc Vitals Group     BP 11/14/18 2242 (!) 178/88     Pulse Rate 11/14/18 2242 72     Resp 11/14/18 2242 17     Temp 11/14/18 2242 98.6 F (37 C)     Temp Source 11/14/18 2242 Oral     SpO2 11/14/18 2237 98 %      Weight 11/14/18 2243 99.8 kg (220 lb)     Height 11/14/18 2243 1.676 m (5\' 6" )     Head Circumference --      Peak Flow --      Pain Score 11/14/18 2243 8     Pain Loc --      Pain Edu? --      Excl. in GC? --     Constitutional: Alert and oriented. Well appearing and in no acute distress. Eyes: Conjunctivae are normal. Mouth/Throat: Mucous membranes are moist.  Oropharynx non-erythematous. Neck: No stridor.   Cardiovascular: Normal rate, regular rhythm. Good peripheral circulation. Grossly normal heart sounds. Respiratory: Normal respiratory effort.  No retractions. No audible wheezing. Gastrointestinal: Epigastric tenderness to palpation..  Musculoskeletal: No lower extremity tenderness nor edema. No gross deformities of extremities. Neurologic:  Normal speech and language. No gross focal neurologic deficits are appreciated.  Skin:  Skin is warm, dry and intact. No rash noted. Psychiatric: Mood and affect are normal. Speech and behavior are normal.  ____________________________________________   LABS (all labs ordered are listed, but only abnormal results are displayed)  Labs Reviewed  CBC WITH DIFFERENTIAL/PLATELET - Abnormal; Notable for the following components:      Result Value   Platelets 141 (*)    All other components within normal limits  COMPREHENSIVE METABOLIC PANEL - Abnormal; Notable for the following components:   Glucose, Bld 193 (*)    All other components within normal limits  LIPASE, BLOOD - Abnormal; Notable for the following components:   Lipase 92 (*)    All other components within normal limits  TROPONIN I (HIGH SENSITIVITY)   ____________________________________________  EKG  ED ECG REPORT I, [place] N BROWN, the attending physician, personally viewed and interpreted this ECG.   Date: 11/15/2018  EKG Time: 10:39 PM  Rate: 74  Rhythm: Sinus rhythm  Axis: Normal  Intervals: Normal  ST&T Change:  None   Procedures   ____________________________________________   INITIAL IMPRESSION / MDM / ASSESSMENT AND PLAN / ED COURSE  As part of my medical decision making, I reviewed the following data within the electronic MEDICAL RECORD NUMBER   68 year old male presenting with above-stated history and physical exam secondary to epigastric abdominal pain.  Patient received multiple doses of IV morphine with initial improvement in pain however pain always recurs.  Patient's laboratory data consistent with pancreatitis as evident by an elevated lipase of 92.  Patient discussed with Dr. Anne HahnWillis for hospital admission for further evaluation and management. ____________________________________________  FINAL CLINICAL IMPRESSION(S) /  ED DIAGNOSES  Final diagnoses:  RUQ pain  Other acute pancreatitis, unspecified complication status     MEDICATIONS GIVEN DURING THIS VISIT:  Medications  morphine 2 MG/ML injection 2 mg (has no administration in time range)  morphine 4 MG/ML injection 4 mg (4 mg Intravenous Given 11/14/18 2338)  ondansetron (ZOFRAN) injection 4 mg (4 mg Intravenous Given 11/14/18 2339)  morphine 4 MG/ML injection 4 mg (4 mg Intravenous Given 11/15/18 0057)     ED Discharge Orders    None      *Please note:  RYKEN PASCHAL was evaluated in Emergency Department on 11/15/2018 for the symptoms described in the history of present illness. He was evaluated in the context of the global COVID-19 pandemic, which necessitated consideration that the patient might be at risk for infection with the SARS-CoV-2 virus that causes COVID-19. Institutional protocols and algorithms that pertain to the evaluation of patients at risk for COVID-19 are in a state of rapid change based on information released by regulatory bodies including the CDC and federal and state organizations. These policies and algorithms were followed during the patient's care in the ED.  Some ED evaluations and interventions may be  delayed as a result of limited staffing during the pandemic.*  Note:  This document was prepared using Dragon voice recognition software and may include unintentional dictation errors.   Gregor Hams, MD 11/15/18 0230

## 2018-11-15 NOTE — Anesthesia Procedure Notes (Signed)
Procedure Name: Intubation Date/Time: 11/15/2018 7:02 PM Performed by: Clinton Sawyer, CRNA Pre-anesthesia Checklist: Patient identified, Emergency Drugs available, Suction available, Patient being monitored and Timeout performed Patient Re-evaluated:Patient Re-evaluated prior to induction Oxygen Delivery Method: Circle system utilized Preoxygenation: Pre-oxygenation with 100% oxygen Induction Type: IV induction Ventilation: Mask ventilation without difficulty Laryngoscope Size: Mac and 4 Grade View: Grade I Tube type: Oral Tube size: 7.5 mm Number of attempts: 1 Airway Equipment and Method: Stylet Placement Confirmation: ETT inserted through vocal cords under direct vision,  positive ETCO2 and breath sounds checked- equal and bilateral Secured at: 23 cm Tube secured with: Tape

## 2018-11-15 NOTE — OR Nursing (Signed)
Ativan now given with better relief noted for agitation.

## 2018-11-15 NOTE — ED Notes (Signed)
ED TO INPATIENT HANDOFF REPORT  ED Nurse Name and Phone #:    S Name/Age/Gender Ian Lucas 68 y.o. male Room/Bed: ED01A/ED01A  Code Status   Code Status: Not on file  Home/SNF/Other Home Patient oriented to: self, place, time and situation Is this baseline? Yes   Triage Complete: Triage complete  Chief Complaint Chest Pain  Triage Note Patient from home c/o of mid epigastric pain that radiates into left side chest. Reports pain began 1 hour ago. Denies cardiac history. States hx of pancreatis. Nitro spray given in route to ed by ems. 324mg  ASA also given in route.    Allergies Allergies  Allergen Reactions  . Banana Shortness Of Breath  . Codeine Itching  . Iodine Swelling    Pt states 30-40 years ago when he had x-ray dye his lips swelled and he threw up. SPM  . Peanut Oil Shortness Of Breath  . Ace Inhibitors Cough  . Gabapentin Other (See Comments)    Anxious and jumpy   . Iodinated Diagnostic Agents Other (See Comments)  . Metformin Diarrhea    Level of Care/Admitting Diagnosis ED Disposition    ED Disposition Condition [facility]: [place] [100120]  Level of Care: Med-Surg [16]  Covid Evaluation: Asymptomatic Screening Protocol (No Symptoms)  Diagnosis: Elevated lipase [371062]  Admitting Physician: Harrie Foreman [[id number]]  Attending Physician: Harrie Foreman [phone]  PT Class (Do Not Modify): Observation [104]  PT Acc Code (Do Not Modify): Observation [10022]       B Medical/Surgery History Past Medical History:  Diagnosis Date  . Diabetes mellitus without complication ([place])   . Hypercholesteremia   . Hypertension   . Neuropathy   . PTSD (post-traumatic stress disorder)    Past Surgical History:  Procedure Laterality Date  . BACK SURGERY    . CIRCUMCISION N/A   . HERNIA REPAIR    . HYDROCELE EXCISION / REPAIR Left      A IV Location/Drains/Wounds Patient Lines/Drains/Airways  Status   Active Line/Drains/Airways    Name:   Placement date:   Placement time:   Site:   Days:   Peripheral IV 12/14/15 Left Antecubital   12/14/15    0752    Antecubital   1067          Intake/Output Last 24 hours No intake or output data in the 24 hours ending 11/15/18 0240  Labs/Imaging Results for orders placed or performed during the hospital encounter of 11/14/18 (from the past 48 hour(s))  CBC with Differential     Status: Abnormal   Collection Time: 11/14/18 10:48 PM  Result Value Ref Range   WBC 5.8 4.0 - 10.5 K/uL   RBC 4.93 4.22 - 5.81 MIL/uL   Hemoglobin 14.4 13.0 - 17.0 g/dL   HCT 43.3 39.0 - 52.0 %   MCV 87.8 80.0 - 100.0 fL   MCH 29.2 26.0 - 34.0 pg   MCHC 33.3 30.0 - 36.0 g/dL   RDW 14.0 11.5 - 15.5 %   Platelets 141 (L) 150 - 400 K/uL   nRBC 0.0 0.0 - 0.2 %   Neutrophils Relative % 57 %   Neutro Abs 3.3 1.7 - 7.7 K/uL   Lymphocytes Relative 33 %   Lymphs Abs 1.9 0.7 - 4.0 K/uL   Monocytes Relative 8 %   Monocytes Absolute 0.5 0.1 - 1.0 K/uL   Eosinophils Relative 2 %   Eosinophils Absolute 0.1 0.0 - 0.5  K/uL   Basophils Relative 0 %   Basophils Absolute 0.0 0.0 - 0.1 K/uL   Immature Granulocytes 0 %   Abs Immature Granulocytes 0.02 0.00 - 0.07 K/uL    Comment: Performed at [facility], [address] [place], [place] [address]  Comprehensive metabolic panel     Status: Abnormal   Collection Time: 11/14/18 10:48 PM  Result Value Ref Range   Sodium 140 135 - 145 mmol/L   Potassium 4.0 3.5 - 5.1 mmol/L   Chloride 106 98 - 111 mmol/L   CO2 25 22 - 32 mmol/L   Glucose, Bld 193 (H) 70 - 99 mg/dL   BUN 17 8 - 23 mg/dL   Creatinine, Ser 1.77 0.61 - 1.24 mg/dL   Calcium 9.1 8.9 - 93.9 mg/dL   Total Protein 7.5 6.5 - 8.1 g/dL   Albumin 3.8 3.5 - 5.0 g/dL   AST 22 15 - 41 U/L   ALT 17 0 - 44 U/L   Alkaline Phosphatase 61 38 - 126 U/L   Total Bilirubin 0.7 0.3 - 1.2 mg/dL   GFR calc non Af Amer >60 >60 mL/min   GFR calc Af Amer >60 >60  mL/min   Anion gap 9 5 - 15    Comment: Performed at [facility], [address] [place], [place] [address]  Lipase, blood     Status: Abnormal   Collection Time: 11/14/18 10:48 PM  Result Value Ref Range   Lipase 92 (H) 11 - 51 U/L    Comment: Performed at [facility], [address], [place], [place] [address]  Troponin I (High Sensitivity)     Status: None   Collection Time: 11/14/18 10:48 PM  Result Value Ref Range   Troponin I (High Sensitivity) 6 <18 ng/L    Comment: (NOTE) Elevated high sensitivity troponin I (hsTnI) values and significant  changes across serial measurements may suggest ACS but many other  chronic and acute conditions are known to elevate hsTnI results.  Refer to the "Links" section for chest pain algorithms and additional  guidance. Performed at [facility], [address] [place], [place] [address]    US Abdomen Limited Ruq  Result Date: 11/15/2018 CLINICAL DATA:  Right upper quadrant abdominal pain EXAM: ULTRASOUND ABDOMEN LIMITED RIGHT UPPER QUADRANT COMPARISON:  None. FINDINGS: Gallbladder: The gallbladder is distended with stones. The gallbladder wall is not thickened and measures approximately 2 mm in thickness. There is gallbladder sludge. The sonographic Eulah Pont sign is negative. Common bile duct: Diameter: 2.7 mm. Liver: Diffuse increased echogenicity with slightly heterogeneous liver. Appearance typically secondary to fatty infiltration. Fibrosis secondary consideration. No secondary findings of cirrhosis noted. No focal hepatic lesion or intrahepatic biliary duct dilatation. Portal vein is patent on color Doppler imaging with normal direction of blood flow towards the liver. IMPRESSION: 1. There is cholelithiasis without secondary signs of acute cholecystitis. 2. Diffuse increased echogenicity with slightly heterogeneous liver. Appearance typically secondary to fatty infiltration. Fibrosis secondary consideration. No  secondary findings of cirrhosis noted. No focal hepatic lesion or intrahepatic biliary duct dilatation. Electronically Signed   By: Katherine Mantle M.D.   On: 11/15/2018 02:33    Pending Labs Unresulted Labs (From admission, onward)    Start     Ordered   Signed and Held  Creatinine, serum  (enoxaparin (LOVENOX)    CrCl >/= 30 ml/min)  Weekly,   R    Comments: while on enoxaparin therapy    Signed and  Held   Signed and Held  TSH  Add-on,   R     Signed and Held   Signed and Held  Hemoglobin A1c  Add-on,   R     Signed and Held          Vitals/Pain Today's Vitals   11/15/18 0130 11/15/18 0150 11/15/18 0216 11/15/18 0232  BP: (!) 161/85   (!) 146/86  Pulse: 73   68  Resp: 17   17  Temp:    98.6 F (37 C)  TempSrc:    Oral  SpO2: 96%   97%  Weight:      Height:      PainSc:  5  0-No pain 7     Isolation Precautions No active isolations  Medications Medications  morphine 4 MG/ML injection 4 mg (4 mg Intravenous Given 11/14/18 2338)  ondansetron (ZOFRAN) injection 4 mg (4 mg Intravenous Given 11/14/18 2339)  morphine 4 MG/ML injection 4 mg (4 mg Intravenous Given 11/15/18 0057)  morphine 2 MG/ML injection 2 mg (2 mg Intravenous Given 11/15/18 0236)    Mobility walks Low fall risk   Focused Assessments medicine    R Recommendations: See Admitting Provider Note  Report given to:   Additional Notes:

## 2018-11-15 NOTE — Anesthesia Postprocedure Evaluation (Signed)
Anesthesia Post Note  Patient: Ian Lucas  Procedure(s) Performed: LAPAROSCOPIC CHOLECYSTECTOMY (N/A )  Patient location during evaluation: PACU Anesthesia Type: General Level of consciousness: awake and alert Pain management: pain level controlled Vital Signs Assessment: post-procedure vital signs reviewed and stable Respiratory status: spontaneous breathing, nonlabored ventilation, respiratory function stable and patient connected to nasal cannula oxygen Cardiovascular status: blood pressure returned to baseline and stable Postop Assessment: no apparent nausea or vomiting Anesthetic complications: no     Last Vitals:  Vitals:   11/15/18 2100 11/15/18 2105  BP:  (!) 186/102  Pulse: (!) 103 96  Resp: (!) 21   Temp:    SpO2: 97% 95%    Last Pain:  Vitals:   11/15/18 2025  TempSrc:   PainSc: 0-No pain                 Precious Haws Piscitello

## 2018-11-15 NOTE — Transfer of Care (Signed)
Immediate Anesthesia Transfer of Care Note  Patient: Ian Lucas  Procedure(s) Performed: LAPAROSCOPIC CHOLECYSTECTOMY (N/A )  Patient Location: PACU  Anesthesia Type:General  Level of Consciousness: sedated  Airway & Oxygen Therapy: Patient Spontanous Breathing and Patient connected to face mask oxygen  Post-op Assessment: Report given to RN and Post -op Vital signs reviewed and stable  Post vital signs: Reviewed and stable  Last Vitals:  Vitals Value Taken Time  BP 158/97 11/15/18 2026  Temp 36.5 C 11/15/18 2025  Pulse 93 11/15/18 2027  Resp 16 11/15/18 2027  SpO2 100 % 11/15/18 2027  Vitals shown include unvalidated device data.  Last Pain:  Vitals:   11/15/18 1314  TempSrc: Oral  PainSc:       Patients Stated Pain Goal: 0 (53/64/68 0321)  Complications: No apparent anesthesia complications

## 2018-11-15 NOTE — Progress Notes (Signed)
Patient admitted this morning by Dr. Marcille Blanco.  Patient with nausea and vomiting and abdominal pain.  Lipase is elevated.  Ultrasound shows no acute cholecystitis.  Continue with current management  Agree with admitting MD plan

## 2018-11-15 NOTE — Anesthesia Post-op Follow-up Note (Signed)
Anesthesia QCDR form completed.        

## 2018-11-15 NOTE — TOC Initial Note (Signed)
Transition of Care [facility]) - Initial/Assessment Note    Patient Details  Name: Ian Lucas MRN: [id number] Date of Birth: 1950-05-20  Transition of Care [facility]) CM/SW Contact:    Beverly Sessions, RN Phone Number: 11/15/2018, 4:38 PM  Clinical Narrative:                 Notified by Hassan Rowan from [facility] that they have received confirmation documentation that patient declined [facility] transfer.  Per Hassan Rowan if patient changes his mind and wishes to be transferred packet will need to be completed  Email notification of admission sent to [email or web address]        Patient Goals and CMS Choice        Expected Discharge Plan and Services                                                Prior Living Arrangements/Services                       Activities of Daily Living Home Assistive Devices/Equipment: None ADL Screening (condition at time of admission) Patient's cognitive ability adequate to safely complete daily activities?: Yes Is the patient deaf or have difficulty hearing?: No Does the patient have difficulty seeing, even when wearing glasses/contacts?: No Does the patient have difficulty concentrating, remembering, or making decisions?: No Patient able to express need for assistance with ADLs?: No Does the patient have difficulty dressing or bathing?: No Independently performs ADLs?: Yes (appropriate for developmental age) Does the patient have difficulty walking or climbing stairs?: No Weakness of Legs: None Weakness of Arms/Hands: None  Permission Sought/Granted                  Emotional Assessment              Admission diagnosis:  RUQ pain [R10.11] Other acute pancreatitis, unspecified complication status [P37.90] Elevated lipase [R74.8] Patient Active Problem List   Diagnosis Date Noted  . Elevated lipase 11/15/2018  . Generalized abdominal pain   . Chronic pain syndrome 03/08/2016  . Ilioinguinal neuralgia of left  side 03/08/2016  . Elevated prostate specific antigen (PSA) 06/18/2015  . Benign essential HTN 06/18/2015  . Chronic midline low back pain with bilateral sciatica 06/18/2015  . Diabetic neuropathy ([place]) 06/18/2015  . Posttraumatic stress disorder 06/18/2015  . Dyslipidemia 06/18/2015  . Lichen simplex 24/01/7352  . History of herpes zoster 06/18/2015  . Restless leg 06/18/2015  . OSA (obstructive sleep apnea) 06/18/2015  . Polyneuropathy 06/18/2015  . 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 Calculus of kidney 08/21/2013  . History of pancreatitis 08/15/2013  . Hydrocele 05/19/2013  . Arthritis of knee, degenerative 10/30/2012  . Lumbar canal stenosis 10/30/2012  . Chronic prostatitis 06/19/2012  . Hypogonadism in male 06/19/2012  . 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 Benign prostatic hyperplasia with urinary obstruction 06/19/2012   PCP:  Steele Sizer, MD Pharmacy:   [id number] [place], [place] [address] [address] Phone: [phone] Fax: [phone]     Social Determinants of Health (SDOH) Interventions    Readmission Risk Interventions No flowsheet data found.

## 2018-11-16 ENCOUNTER — Encounter: Payer: Self-pay | Admitting: Surgery

## 2018-11-16 LAB — GLUCOSE, CAPILLARY
Glucose-Capillary: 171 mg/dL — ABNORMAL HIGH (ref 70–99)
Glucose-Capillary: 181 mg/dL — ABNORMAL HIGH (ref 70–99)
Glucose-Capillary: 165 mg/dL — ABNORMAL HIGH (ref 70–99)
Glucose-Capillary: 104 mg/dL — ABNORMAL HIGH (ref 70–99)
Glucose-Capillary: 132 mg/dL — ABNORMAL HIGH (ref 70–99)

## 2018-11-16 LAB — COMPREHENSIVE METABOLIC PANEL
ALT: 82 U/L — ABNORMAL HIGH (ref 0–44)
AST: 86 U/L — ABNORMAL HIGH (ref 15–41)
Albumin: 3.7 g/dL (ref 3.5–5.0)
Alkaline Phosphatase: 52 U/L (ref 38–126)
Anion gap: 11 (ref 5–15)
BUN: 13 mg/dL (ref 8–23)
CO2: 23 mmol/L (ref 22–32)
Calcium: 8.6 mg/dL — ABNORMAL LOW (ref 8.9–10.3)
Chloride: 105 mmol/L (ref 98–111)
Creatinine, Ser: 0.97 mg/dL (ref 0.61–1.24)
GFR calc Af Amer: >60 mL/min (ref >60)
GFR calc non Af Amer: >60 mL/min (ref >60)
Glucose, Bld: 196 mg/dL — ABNORMAL HIGH (ref 70–99)
Potassium: 4 mmol/L (ref 3.5–5.1)
Sodium: 139 mmol/L (ref 135–145)
Total Bilirubin: 1 mg/dL (ref 0.3–1.2)
Total Protein: 7.6 g/dL (ref 6.5–8.1)

## 2018-11-16 LAB — CBC
HCT: 44.3 % (ref 39.0–52.0)
Hemoglobin: 14.6 g/dL (ref 13.0–17.0)
MCH: 29.3 pg (ref 26.0–34.0)
MCHC: 33 g/dL (ref 30.0–36.0)
MCV: 89 fL (ref 80.0–100.0)
Platelets: 125 10*3/uL — ABNORMAL LOW (ref 150–400)
RBC: 4.98 MIL/uL (ref 4.22–5.81)
RDW: 14.2 % (ref 11.5–15.5)
WBC: 13.5 10*3/uL — ABNORMAL HIGH (ref 4.0–10.5)
nRBC: 0 % (ref 0.0–0.2)

## 2018-11-16 LAB — HEMOGLOBIN A1C
Hgb A1c MFr Bld: 6 % — ABNORMAL HIGH (ref 4.8–5.6)
Mean Plasma Glucose: 126 mg/dL

## 2018-11-16 MED ORDER — CARVEDILOL 12.5 MG PO TABS
12.5000 mg | ORAL_TABLET | Freq: Two times a day (BID) | ORAL | Status: DC
  Administered 2018-11-16 – 2018-11-17 (×2): 12.5 mg via ORAL
  Filled 2018-11-16 (×2): qty 1

## 2018-11-16 MED ORDER — ALPRAZOLAM 0.25 MG PO TABS
0.2500 mg | ORAL_TABLET | Freq: Three times a day (TID) | ORAL | Status: DC | PRN
  Administered 2018-11-16 – 2018-11-17 (×3): 0.25 mg via ORAL
  Filled 2018-11-16 (×3): qty 1

## 2018-11-16 MED ORDER — LOSARTAN POTASSIUM 50 MG PO TABS
100.0000 mg | ORAL_TABLET | Freq: Every day | ORAL | Status: DC
  Administered 2018-11-16 – 2018-11-17 (×2): 100 mg via ORAL
  Filled 2018-11-16 (×2): qty 2

## 2018-11-16 MED ORDER — TAMSULOSIN HCL 0.4 MG PO CAPS
0.4000 mg | ORAL_CAPSULE | Freq: Two times a day (BID) | ORAL | Status: DC
  Administered 2018-11-16 – 2018-11-17 (×2): 0.4 mg via ORAL
  Filled 2018-11-16 (×2): qty 1

## 2018-11-16 MED ORDER — INSULIN GLARGINE 100 UNIT/ML ~~LOC~~ SOLN
20.0000 [IU] | Freq: Every day | SUBCUTANEOUS | Status: DC
  Administered 2018-11-16: 20 [IU] via SUBCUTANEOUS
  Filled 2018-11-16 (×2): qty 0.2

## 2018-11-16 MED ORDER — ALOGLIPTIN BENZOATE 25 MG PO TABS: 25.0000 mg | ORAL_TABLET | Freq: Every day | ORAL | Status: DC

## 2018-11-16 MED ORDER — MELATONIN 5 MG PO TABS
15.0000 mg | ORAL_TABLET | Freq: Every day | ORAL | Status: DC
  Administered 2018-11-16: 15 mg via ORAL
  Filled 2018-11-16 (×2): qty 3

## 2018-11-16 MED ORDER — DULOXETINE HCL 30 MG PO CPEP
60.0000 mg | ORAL_CAPSULE | Freq: Every day | ORAL | Status: DC
  Administered 2018-11-16 – 2018-11-17 (×2): 60 mg via ORAL
  Filled 2018-11-16 (×2): qty 2

## 2018-11-16 MED ORDER — BUPROPION HCL ER (XL) 150 MG PO TB24
150.0000 mg | ORAL_TABLET | Freq: Every day | ORAL | Status: DC
  Administered 2018-11-16 – 2018-11-17 (×2): 150 mg via ORAL
  Filled 2018-11-16 (×2): qty 1

## 2018-11-16 MED ORDER — INSULIN ASPART 100 UNIT/ML ~~LOC~~ SOLN
0.0000 [IU] | Freq: Three times a day (TID) | SUBCUTANEOUS | Status: DC
  Administered 2018-11-16: 2 [IU] via SUBCUTANEOUS
  Filled 2018-11-16: qty 1

## 2018-11-16 MED ORDER — HALOPERIDOL LACTATE 5 MG/ML IJ SOLN
5.0000 mg | Freq: Once | INTRAMUSCULAR | Status: AC
  Administered 2018-11-16: 5 mg via INTRAVENOUS
  Filled 2018-11-16: qty 1

## 2018-11-16 MED ORDER — ASPIRIN EC 81 MG PO TBEC
81.0000 mg | DELAYED_RELEASE_TABLET | Freq: Every day | ORAL | Status: DC
  Administered 2018-11-16 – 2018-11-17 (×2): 81 mg via ORAL
  Filled 2018-11-16 (×2): qty 1

## 2018-11-16 MED ORDER — TIOTROPIUM BROMIDE MONOHYDRATE 18 MCG IN CAPS
18.0000 ug | ORAL_CAPSULE | Freq: Every day | RESPIRATORY_TRACT | Status: DC
  Filled 2018-11-16: qty 5

## 2018-11-16 MED ORDER — SODIUM CHLORIDE 0.9 % IV SOLN
INTRAVENOUS | Status: DC | PRN
  Administered 2018-11-16: 250 mL via INTRAVENOUS

## 2018-11-16 MED ORDER — HYDROXYCHLOROQUINE SULFATE 200 MG PO TABS
400.0000 mg | ORAL_TABLET | Freq: Every day | ORAL | Status: DC
  Administered 2018-11-16 – 2018-11-17 (×2): 400 mg via ORAL
  Filled 2018-11-16 (×2): qty 2

## 2018-11-16 MED ORDER — ENOXAPARIN SODIUM 40 MG/0.4ML ~~LOC~~ SOLN
40.0000 mg | SUBCUTANEOUS | Status: DC
  Administered 2018-11-16: 40 mg via SUBCUTANEOUS
  Filled 2018-11-16: qty 0.4

## 2018-11-16 MED ORDER — OXYCODONE HCL 5 MG PO TABS: 5.0000 mg | ORAL_TABLET | ORAL | Status: DC | PRN

## 2018-11-16 MED ORDER — PRAVASTATIN SODIUM 20 MG PO TABS
80.0000 mg | ORAL_TABLET | Freq: Every day | ORAL | Status: DC
  Administered 2018-11-16: 22:00:00 80 mg via ORAL
  Filled 2018-11-16: qty 4

## 2018-11-16 MED ORDER — INSULIN ASPART 100 UNIT/ML ~~LOC~~ SOLN: 0.0000 [IU] | Freq: Every day | SUBCUTANEOUS | Status: DC

## 2018-11-16 MED ORDER — AMLODIPINE BESYLATE 10 MG PO TABS
10.0000 mg | ORAL_TABLET | Freq: Every day | ORAL | Status: DC
  Administered 2018-11-16 – 2018-11-17 (×2): 10 mg via ORAL
  Filled 2018-11-16 (×2): qty 1

## 2018-11-16 NOTE — Progress Notes (Signed)
Prime doc paged concerning patients agitation

## 2018-11-16 NOTE — Progress Notes (Signed)
S/p cholecystectomy- GI will sign out   I will sign off.  Please call me if any further GI concerns or questions.  We would like to thank you for the opportunity to participate in the care of Ian Lucas.    Dr Jonathon Bellows MD,MRCP [facility]) Gastroenterology/Hepatology Pager: [phone]

## 2018-11-16 NOTE — Progress Notes (Signed)
POD #1  Delirium much improved AVSS Drain 50cc No complaints and feeling much better Taking PO  PE NAD Abd: soft, incision c/d/i. No infection. JP serosanguinous  A/P Doing well Needs antibiotics for a total of 10 days DC in am, keep drain

## 2018-11-16 NOTE — Progress Notes (Signed)
[employer] - [place] at [facility]   PATIENT NAME: Ian Lucas    MR#:  [id number]  DATE OF BIRTH:  12-03-1950  SUBJECTIVE:  CHIEF COMPLAINT:   Chief Complaint  Patient presents with  . Abdominal Pain  . Chest Pain   The patient has no complaints. REVIEW OF SYSTEMS:  Review of Systems  Constitutional: Negative for chills, fever and malaise/fatigue.  HENT: Negative for sore throat.   Eyes: Negative for blurred vision and double vision.  Respiratory: Negative for cough, hemoptysis, shortness of breath, wheezing and stridor.   Cardiovascular: Negative for chest pain, palpitations, orthopnea and leg swelling.  Gastrointestinal: Negative for abdominal pain, blood in stool, diarrhea, melena, nausea and vomiting.  Genitourinary: Negative for dysuria, flank pain and hematuria.  Musculoskeletal: Negative for back pain and joint pain.  Neurological: Negative for dizziness, sensory change, focal weakness, seizures, loss of consciousness, weakness and headaches.  Endo/Heme/Allergies: Negative for polydipsia.  Psychiatric/Behavioral: Negative for depression. The patient is not nervous/anxious.     DRUG ALLERGIES:   Allergies  Allergen Reactions  . Banana Shortness Of Breath  . Codeine Itching  . Iodine Swelling    Pt states 30-40 years ago when he had x-ray dye his lips swelled and he threw up. SPM  . Peanut Oil Shortness Of Breath  . Ace Inhibitors Cough  . Gabapentin Other (See Comments)    Anxious and jumpy   . Iodinated Diagnostic Agents Other (See Comments)  . Metformin Diarrhea   VITALS:  Blood pressure (!) 157/83, pulse 91, temperature 97.9 F (36.6 C), temperature source Oral, resp. rate (!) 22, height 5\' 7"  (1.702 m), weight 101.4 kg, SpO2 97 %. PHYSICAL EXAMINATION:  Physical Exam Constitutional:      General: He is not in acute distress.    Appearance: He is obese.  HENT:     Head: Normocephalic.     Mouth/Throat:     Mouth: Mucous  membranes are moist.  Eyes:     General: No scleral icterus.    Conjunctiva/sclera: Conjunctivae normal.     Pupils: Pupils are equal, round, and reactive to light.  Neck:     Musculoskeletal: Normal range of motion and neck supple.     Vascular: No JVD.     Trachea: No tracheal deviation.  Cardiovascular:     Rate and Rhythm: Normal rate and regular rhythm.     Heart sounds: Normal heart sounds. No murmur. No gallop.   Pulmonary:     Effort: Pulmonary effort is normal. No respiratory distress.     Breath sounds: Normal breath sounds. No wheezing or rales.  Abdominal:     General: Bowel sounds are normal. There is no distension.     Palpations: Abdomen is soft.     Tenderness: There is no abdominal tenderness. There is no rebound.  Musculoskeletal: Normal range of motion.        General: No tenderness.     Right lower leg: No edema.     Left lower leg: No edema.  Skin:    Findings: No erythema or rash.  Neurological:     General: No focal deficit present.     Mental Status: He is alert and oriented to person, place, and time.     Cranial Nerves: No cranial nerve deficit.  Psychiatric:        Mood and Affect: Mood normal.    LABORATORY PANEL:  Male CBC Recent Labs  Lab 11/16/18 0515  WBC 13.5*  HGB 14.6  HCT 44.3  PLT 125*   ------------------------------------------------------------------------------------------------------------------ Chemistries  Recent Labs  Lab 11/16/18 0515  NA 139  K 4.0  CL 105  CO2 23  GLUCOSE 196*  BUN 13  CREATININE 0.97  CALCIUM 8.6*  AST 86*  ALT 82*  ALKPHOS 52  BILITOT 1.0   RADIOLOGY:  No results found. ASSESSMENT AND PLAN:   This is a 68 year old male admitted for abdominal pain. 1.    Acute cholecystitis with cholelithiasis and leukocytosis. S/p lap cholecystectomy, continue Zosyn IV, Zofran as needed, pain control. Advanced diet. Per Dr. Dahlia Byes, Needs antibiotics for a total of 10 days DC in am, keep drain.  2.   Cholelithiasis: As above. 3.  Elevated lipase and abnormal liver function test: Due to above.   4.  Hypertension: continue amlodipine, carvedilol and losartan.  5.  Diabetes mellitus type 2: Continue lantus and sliding scale insulin. 6.  Hyperlipidemia: Continue statin therapy 7.  BPH: Continue tamsulosin and prazosin 8.  DVT prophylaxis: Start Lovenox once we are sure the patient will not require invasive procedures  All the records are reviewed and case discussed with Care Management/Social Worker. Management plans discussed with the patient, family and they are in agreement.  CODE STATUS: Full Code  TOTAL TIME TAKING CARE OF THIS PATIENT: 27 minutes.   More than 50% of the time was spent in counseling/coordination of care: YES  POSSIBLE D/C IN 1- 2 DAYS, DEPENDING ON CLINICAL CONDITION.   Demetrios Loll M.D on 11/16/2018 at 3:37 PM  Between 7am to 6pm - Pager - [phone]  After 6pm go to [email or web address] - [place] [facility]

## 2018-11-17 LAB — COMPREHENSIVE METABOLIC PANEL
ALT: 56 U/L — ABNORMAL HIGH (ref 0–44)
AST: 51 U/L — ABNORMAL HIGH (ref 15–41)
Albumin: 3.2 g/dL — ABNORMAL LOW (ref 3.5–5.0)
Alkaline Phosphatase: 41 U/L (ref 38–126)
Anion gap: 7 (ref 5–15)
BUN: 14 mg/dL (ref 8–23)
CO2: 27 mmol/L (ref 22–32)
Calcium: 8.5 mg/dL — ABNORMAL LOW (ref 8.9–10.3)
Chloride: 106 mmol/L (ref 98–111)
Creatinine, Ser: 0.88 mg/dL (ref 0.61–1.24)
GFR calc Af Amer: >60 mL/min (ref >60)
GFR calc non Af Amer: >60 mL/min (ref >60)
Glucose, Bld: 111 mg/dL — ABNORMAL HIGH (ref 70–99)
Potassium: 3.5 mmol/L (ref 3.5–5.1)
Sodium: 140 mmol/L (ref 135–145)
Total Bilirubin: 1 mg/dL (ref 0.3–1.2)
Total Protein: 6.7 g/dL (ref 6.5–8.1)

## 2018-11-17 LAB — GLUCOSE, CAPILLARY
Glucose-Capillary: 104 mg/dL — ABNORMAL HIGH (ref 70–99)
Glucose-Capillary: 108 mg/dL — ABNORMAL HIGH (ref 70–99)
Glucose-Capillary: 114 mg/dL — ABNORMAL HIGH (ref 70–99)

## 2018-11-17 LAB — LIPASE, BLOOD: Lipase: 56 U/L — ABNORMAL HIGH (ref 11–51)

## 2018-11-17 LAB — CBC
HCT: 39.3 % (ref 39.0–52.0)
Hemoglobin: 12.8 g/dL — ABNORMAL LOW (ref 13.0–17.0)
MCH: 29.2 pg (ref 26.0–34.0)
MCHC: 32.6 g/dL (ref 30.0–36.0)
MCV: 89.5 fL (ref 80.0–100.0)
Platelets: 120 10*3/uL — ABNORMAL LOW (ref 150–400)
RBC: 4.39 MIL/uL (ref 4.22–5.81)
RDW: 14 % (ref 11.5–15.5)
WBC: 9.2 10*3/uL (ref 4.0–10.5)
nRBC: 0 % (ref 0.0–0.2)

## 2018-11-17 LAB — MAGNESIUM: Magnesium: 1.8 mg/dL (ref 1.7–2.4)

## 2018-11-17 MED ORDER — POTASSIUM CHLORIDE CRYS ER 20 MEQ PO TBCR
40.0000 meq | EXTENDED_RELEASE_TABLET | Freq: Once | ORAL | Status: AC
  Administered 2018-11-17: 07:00:00 40 meq via ORAL
  Filled 2018-11-17: qty 2

## 2018-11-17 MED ORDER — AMOXICILLIN-POT CLAVULANATE 875-125 MG PO TABS: 1.0000 | ORAL_TABLET | Freq: Two times a day (BID) | ORAL | 0 refills | Status: DC

## 2018-11-17 NOTE — Discharge Summary (Signed)
[employer] - [place] at [facility]   PATIENT NAME: Ian Lucas    MR#:  [id number]  DATE OF BIRTH:  12/16/1950  DATE OF ADMISSION:  11/14/2018   ADMITTING PHYSICIAN: Arnaldo NatalMichael S Diamond, MD  DATE OF DISCHARGE: 11/17/2018  PRIMARY CARE PHYSICIAN: Alba CorySowles, Krichna, MD   ADMISSION DIAGNOSIS:  RUQ pain [R10.11] Other acute pancreatitis, unspecified complication status [K85.80] Elevated lipase [R74.8] DISCHARGE DIAGNOSIS:  Active Problems:   Elevated lipase   Generalized abdominal pain  SECONDARY DIAGNOSIS:   Past Medical History:  Diagnosis Date  . Diabetes mellitus without complication (HCC)   . Hypercholesteremia   . Hypertension   . Neuropathy   . Pancreatitis   . PTSD (post-traumatic stress disorder)    HOSPITAL COURSE:  This is a 68 year old male admitted for abdominal pain. 1.   Acute cholecystitis with cholelithiasis and leukocytosis. S/p lap cholecystectomy POD2, continue Zosyn IV, Zofran as needed, pain control. Advanced diet. Per Dr. Everlene FarrierPabon, Needs antibiotics for a total of 10 days DC in am, keep drain.  Change to Augmentin p.o. twice daily.  2. Cholelithiasis: As above. 3. Elevated lipase and abnormal liver function test: Due to above.    Improved. 4. Hypertension: continue amlodipine, carvedilol and losartan.  5. Diabetes mellitus type 2: Continue lantus and sliding scale insulin. 6. Hyperlipidemia: Continue statin therapy 7. BPH: Continue tamsulosin and prazosin DISCHARGE CONDITIONS:  Stable, discharge to home today. CONSULTS OBTAINED:  Treatment Team:  Leafy RoPabon, Diego F, MD DRUG ALLERGIES:   Allergies  Allergen Reactions  . Banana Shortness Of Breath  . Codeine Itching  . Iodine Swelling    Pt states 30-40 years ago when he had x-ray dye his lips swelled and he threw up. SPM  . Peanut Oil Shortness Of Breath  . Ace Inhibitors Cough  . Gabapentin Other (See Comments)    Anxious and jumpy   . Iodinated Diagnostic Agents  Other (See Comments)  . Metformin Diarrhea   DISCHARGE MEDICATIONS:   Allergies as of 11/17/2018      Reactions   Banana Shortness Of Breath   Codeine Itching   Iodine Swelling   Pt states 30-40 years ago when he had x-ray dye his lips swelled and he threw up. SPM   Peanut Oil Shortness Of Breath   Ace Inhibitors Cough   Gabapentin Other (See Comments)   Anxious and jumpy    Iodinated Diagnostic Agents Other (See Comments)   Metformin Diarrhea      Medication List    TAKE these medications   Alogliptin Benzoate 25 MG Tabs Take 25 mg by mouth daily.   amLODipine 10 MG tablet Commonly known as: NORVASC Take 10 mg by mouth daily.   amoxicillin-clavulanate 875-125 MG tablet Commonly known as: Augmentin Take 1 tablet by mouth 2 (two) times daily for 8 days.   aspirin 81 MG tablet Take 81 mg by mouth daily.   buPROPion 150 MG 24 hr tablet Commonly known as: WELLBUTRIN XL Take 150 mg by mouth daily.   carvedilol 12.5 MG tablet Commonly known as: COREG Take 12.5 mg by mouth 2 (two) times daily with a meal.   diclofenac sodium 1 % Gel Commonly known as: VOLTAREN Apply 2 g topically every 6 (six) hours.   DULoxetine 60 MG capsule Commonly known as: CYMBALTA Take 60 mg by mouth daily.   hydroxychloroquine 200 MG tablet Commonly known as: PLAQUENIL Take 400 mg by mouth daily.   ibuprofen 600 MG tablet Commonly known as: ADVIL  Take 1 tablet (600 mg total) by mouth every 6 (six) hours as needed. What changed:   how much to take  when to take this   Lantus 100 UNIT/ML injection Generic drug: insulin glargine Inject 40 Units into the skin daily.   losartan 100 MG tablet Commonly known as: COZAAR Take 100 mg by mouth daily.   Melatonin 5 MG Tabs Take 15 mg by mouth at bedtime.   pravastatin 80 MG tablet Commonly known as: PRAVACHOL Take 80 mg by mouth at bedtime.   sildenafil 20 MG tablet Commonly known as: REVATIO Take 20 mg by mouth as needed.    tamsulosin 0.4 MG Caps capsule Commonly known as: FLOMAX Take 0.4 mg by mouth 2 (two) times a day.   tiotropium 18 MCG inhalation capsule Commonly known as: SPIRIVA Place 1 capsule (18 mcg total) into inhaler and inhale daily.        DISCHARGE INSTRUCTIONS:  See AVS.  If you experience worsening of your admission symptoms, develop shortness of breath, life threatening emergency, suicidal or homicidal thoughts you must seek medical attention immediately by calling 911 or calling your MD immediately  if symptoms less severe.  You Must read complete instructions/literature along with all the possible adverse reactions/side effects for all the Medicines you take and that have been prescribed to you. Take any new Medicines after you have completely understood and accpet all the possible adverse reactions/side effects.   Please note  You were cared for by a hospitalist during your hospital stay. If you have any questions about your discharge medications or the care you received while you were in the hospital after you are discharged, you can call the unit and asked to speak with the hospitalist on call if the hospitalist that took care of you is not available. Once you are discharged, your primary care physician will handle any further medical issues. Please note that NO REFILLS for any discharge medications will be authorized once you are discharged, as it is imperative that you return to your primary care physician (or establish a relationship with a primary care physician if you do not have one) for your aftercare needs so that they can reassess your need for medications and monitor your lab values.    On the day of Discharge:  VITAL SIGNS:  Blood pressure (!) 153/92, pulse 76, temperature 98.1 F (36.7 C), temperature source Oral, resp. rate 20, height 5\' 7"  (1.702 m), weight 101.5 kg, SpO2 95 %. PHYSICAL EXAMINATION:  GENERAL:  68 y.o.-year-old patient lying in the bed with no acute  distress.  Obesity. EYES: Pupils equal, round, reactive to light and accommodation. No scleral icterus. Extraocular muscles intact.  HEENT: Head atraumatic, normocephalic. Oropharynx and nasopharynx clear.  NECK:  Supple, no jugular venous distention. No thyroid enlargement, no tenderness.  LUNGS: Normal breath sounds bilaterally, no wheezing, rales,rhonchi or crepitation. No use of accessory muscles of respiration.  CARDIOVASCULAR: S1, S2 normal. No murmurs, rubs, or gallops.  ABDOMEN: Soft, non-tender, non-distended. Bowel sounds present. No organomegaly or mass.  EXTREMITIES: No pedal edema, cyanosis, or clubbing.  NEUROLOGIC: Cranial nerves II through XII are intact. Muscle strength 5/5 in all extremities. Sensation intact. Gait not checked.  PSYCHIATRIC: The patient is alert and oriented x 3.  SKIN: No obvious rash, lesion, or ulcer.  DATA REVIEW:   CBC Recent Labs  Lab 11/17/18 0502  WBC 9.2  HGB 12.8*  HCT 39.3  PLT 120*    Chemistries  Recent Labs  Lab 11/17/18 0502  NA 140  K 3.5  CL 106  CO2 27  GLUCOSE 111*  BUN 14  CREATININE 0.88  CALCIUM 8.5*  MG 1.8  AST 51*  ALT 56*  ALKPHOS 41  BILITOT 1.0     Microbiology Results  Results for orders placed or performed during the hospital encounter of 11/14/18  SARS Coronavirus 2 (CEPHEID - Performed in [facility] hospital lab), Hosp Order     Status: None   Collection Time: 11/14/18 10:48 PM   Specimen: Nasopharyngeal Swab  Result Value Ref Range Status   SARS Coronavirus 2 NEGATIVE NEGATIVE Final    Comment: (NOTE) If result is NEGATIVE SARS-CoV-2 target nucleic acids are NOT DETECTED. The SARS-CoV-2 RNA is generally detectable in upper and lower  respiratory specimens during the acute phase of infection. The lowest  concentration of SARS-CoV-2 viral copies this assay can detect is 250  copies / mL. A negative result does not preclude SARS-CoV-2 infection  and should not be used as the sole basis for  treatment or other  patient management decisions.  A negative result may occur with  improper specimen collection / handling, submission of specimen other  than nasopharyngeal swab, presence of viral mutation(s) within the  areas targeted by this assay, and inadequate number of viral copies  (<250 copies / mL). A negative result must be combined with clinical  observations, patient history, and epidemiological information. If result is POSITIVE SARS-CoV-2 target nucleic acids are DETECTED. The SARS-CoV-2 RNA is generally detectable in upper and lower  respiratory specimens dur ing the acute phase of infection.  Positive  results are indicative of active infection with SARS-CoV-2.  Clinical  correlation with patient history and other diagnostic information is  necessary to determine patient infection status.  Positive results do  not rule out bacterial infection or co-infection with other viruses. If result is PRESUMPTIVE POSTIVE SARS-CoV-2 nucleic acids MAY BE PRESENT.   A presumptive positive result was obtained on the submitted specimen  and confirmed on repeat testing.  While 2019 novel coronavirus  (SARS-CoV-2) nucleic acids may be present in the submitted sample  additional confirmatory testing may be necessary for epidemiological  and / or clinical management purposes  to differentiate between  SARS-CoV-2 and other Sarbecovirus currently known to infect humans.  If clinically indicated additional testing with an alternate test  methodology [phone]) is advised. The SARS-CoV-2 RNA is generally  detectable in upper and lower respiratory sp ecimens during the acute  phase of infection. The expected result is Negative. Fact Sheet for Patients:  BoilerBrush.com.cy Fact Sheet for Healthcare Providers: https://pope.com/ This test is not yet approved or cleared by the [place] FDA and has been authorized for detection and/or  diagnosis of SARS-CoV-2 by FDA under an Emergency Use Authorization (EUA).  This EUA will remain in effect (meaning this test can be used) for the duration of the COVID-19 declaration under Section 564(b)(1) of the Act, 21 U.S.C. section 360bbb-3(b)(1), unless the authorization is terminated or revoked sooner. Performed at [facility], [address] [place], [place] [address]     RADIOLOGY:  No results found.   Management plans discussed with the patient, family and they are in agreement.  CODE STATUS: Full Code   TOTAL TIME TAKING CARE OF THIS PATIENT: 33 minutes.    Shaune Pollack M.D on 11/17/2018 at 10:50 AM  Between 7am to 6pm - Pager - [phone]  After 6pm go to [email or web address] - password [email or web address]  [facility]  Office  [phone]  CC: Primary care physician; Steele Sizer, MD   Note: This dictation was prepared with Dragon dictation along with smaller phrase technology. Any transcriptional errors that result from this process are unintentional.

## 2018-11-17 NOTE — Progress Notes (Signed)
Pt seen and examined AVSS JP serosanguinous Taking PO  PE NAD Abd: soft, nt, incisions c/d/i, no infection  A/p Doing well Keep drain A/bs for 10 days f/u w me next wednesday

## 2018-11-18 ENCOUNTER — Telehealth: Payer: Self-pay

## 2018-11-18 NOTE — Telephone Encounter (Signed)
First attempt to reach patient for TCM call and schedule hospital follow up appt. Called mobile number and patient answered and hung up. Tried to call house number as well, no answer.

## 2018-11-19 LAB — SURGICAL PATHOLOGY

## 2018-11-20 ENCOUNTER — Ambulatory Visit (INDEPENDENT_AMBULATORY_CARE_PROVIDER_SITE_OTHER): Payer: Non-veteran care | Admitting: Surgery

## 2018-11-20 ENCOUNTER — Encounter: Payer: Self-pay | Admitting: Surgery

## 2018-11-20 ENCOUNTER — Other Ambulatory Visit: Payer: Self-pay

## 2018-11-20 VITALS — BP 171/89 | HR 78 | Temp 98.6°F | Resp 16 | Ht 67.0 in | Wt 221.0 lb

## 2018-11-20 DIAGNOSIS — Z09 Encounter for follow-up examination after completed treatment for conditions other than malignant neoplasm: Secondary | ICD-10-CM

## 2018-11-20 NOTE — Patient Outreach (Signed)
[facility]) Care Management  11/20/2018  Ian Lucas 02-21-1951 [id number]  EMMI: general discharge Referral date: 11/19/18 Referral reason: know who to call about changes in conditions Insurance: [place] health care Day # 1  Attempt #2  Telephone call to patient regarding EMMI general discharge red alert. Unable to reach patient. HIPAA compliant voice message left with call back phone number.    PLAN: RNCm will attempt 2nd telephone call to patient within 4 business day.  RNCM will send outreach letter to attempt contact.   Quinn Plowman RN,BSN,CCM [facility] Telephonic  [phone]

## 2018-11-20 NOTE — Patient Instructions (Addendum)
   Maintain a healthy weight. Eat with moderation.   Walk daily. Please call our office if you have any questions or concerns.   GENERAL POST-OPERATIVE PATIENT INSTRUCTIONS   WOUND CARE INSTRUCTIONS:  Keep a dry clean dressing on the wound if there is drainage. The initial bandage may be removed after 24 hours.  Once the wound has quit draining you may leave it open to air.  If clothing rubs against the wound or causes irritation and the wound is not draining you may cover it with a dry dressing during the daytime.  Try to keep the wound dry and avoid ointments on the wound unless directed to do so.  If the wound becomes bright red and painful or starts to drain infected material that is not clear, please contact your physician immediately.  If the wound is mildly pink and has a thick firm ridge underneath it, this is normal, and is referred to as a healing ridge.  This will resolve over the next 4-6 weeks.  BATHING: You may shower if you have been informed of this by your surgeon. However, Please do not submerge in a tub, hot tub, or pool until incisions are completely sealed or have been told by your surgeon that you may do so.  DIET:  You may eat any foods that you can tolerate.  It is a good idea to eat a high fiber diet and take in plenty of fluids to prevent constipation.  If you do become constipated you may want to take a mild laxative or take ducolax tablets on a daily basis until your bowel habits are regular.  Constipation can be very uncomfortable, along with straining, after recent surgery.  ACTIVITY:  You are encouraged to cough and deep breath or use your incentive spirometer if you were given one, every 15-30 minutes when awake.  This will help prevent respiratory complications and low grade fevers post-operatively if you had a general anesthetic.  You may want to hug a pillow when coughing and sneezing to add additional support to the surgical area, if you had abdominal or chest  surgery, which will decrease pain during these times.  You are encouraged to walk and engage in light activity for the next two weeks.  You should not lift more than 20 pounds, until 12/27/2018 as it could put you at increased risk for complications.  Twenty pounds is roughly equivalent to a plastic bag of groceries. At that time- Listen to your body when lifting, if you have pain when lifting, stop and then try again in a few days. Soreness after doing exercises or activities of daily living is normal as you get back in to your normal routine.  MEDICATIONS:  Try to take narcotic medications and anti-inflammatory medications, such as tylenol, ibuprofen, naprosyn, etc., with food.  This will minimize stomach upset from the medication.  Should you develop nausea and vomiting from the pain medication, or develop a rash, please discontinue the medication and contact your physician.  You should not drive, make important decisions, or operate machinery when taking narcotic pain medication.  SUNBLOCK Use sun block to incision area over the next year if this area will be exposed to sun. This helps decrease scarring and will allow you avoid a permanent darkened area over your incision.  QUESTIONS:  Please feel free to call our office if you have any questions, and we will be glad to assist you. [phone]

## 2018-11-20 NOTE — Patient Outreach (Signed)
[facility]) Care Management  11/20/2018  Ian Lucas [birth year] [id number]   EMMI: general discharge red alert Referral date: 11/19/18 Referral reason: know who to call about changes in conditions Insurance: [place] health care Day # 1  Telephone call to patient regarding EMMI general discharge red alert. HIPAA verified with patient. RNCM introduced herself and explained reason for call. Patient gave verbal permission to speak to his wife, [name] regarding his medical information. Wife and patient on call.   Patient states he is scheduled to follow up with the surgeon on today and have drains pulled out. RNCM advised patient to call the surgeon's office if he has any problems/ symptoms related to his recent surgery. Patient verbalized understanding.  Patient states he has transportation to his appointment this afternoon with his doctor. He states he is taking his medications as prescribed. Patient denies any new problems/ symptoms post discharge. Patient states, " I am doing pretty good."  Patient reports his pain level at a 6.  He states he will take medication as needed for his pain.  Patient denies any further needs or concerns at this.time.  Patient verbally agreed to ongoing EMMI general automated calls.   RNCM provided patient contact phone number for 24 hour nurse advise line.  RNCM advised patient to notify MD of any changes in condition prior to scheduled appointment. RNCM verified patient aware of 911 services for urgent/ emergent needs.  RNCM discussed COVID 19 precautions and symptoms.  Advised patient to contact her doctor for minor symptoms. If symptoms more severe call 911.  Patient verbalized understanding.   PLAN; RNCM will close case due to patient being assessed and having no further needs.   Quinn Plowman RN,BSN,CCM [facility] Telephonic  [phone]

## 2018-11-20 NOTE — Progress Notes (Signed)
S/p lap chole for gangrenous cholecystitis Doing very well No fevers or chills Taking PO About 30-45 c day from drain serous  PE NAD Abd: soft, nt, incisions c/d/i, no infection. Drain removed ( serous)  A/ p Doing very well No heavy lifting RTC prn

## 2018-11-21 ENCOUNTER — Ambulatory Visit: Payer: Self-pay

## 2018-11-25 ENCOUNTER — Ambulatory Visit (INDEPENDENT_AMBULATORY_CARE_PROVIDER_SITE_OTHER): Payer: Medicare Other | Admitting: Family Medicine

## 2018-11-25 ENCOUNTER — Encounter: Payer: Self-pay | Admitting: Family Medicine

## 2018-11-25 ENCOUNTER — Other Ambulatory Visit: Payer: Self-pay

## 2018-11-25 VITALS — BP 114/62 | HR 83 | Temp 97.3°F | Resp 16 | Ht 66.0 in | Wt 220.1 lb

## 2018-11-25 DIAGNOSIS — D696 Thrombocytopenia, unspecified: Secondary | ICD-10-CM

## 2018-11-25 DIAGNOSIS — I700 Atherosclerosis of aorta: Secondary | ICD-10-CM

## 2018-11-25 DIAGNOSIS — E1142 Type 2 diabetes mellitus with diabetic polyneuropathy: Secondary | ICD-10-CM

## 2018-11-25 DIAGNOSIS — Z23 Encounter for immunization: Secondary | ICD-10-CM | POA: Diagnosis not present

## 2018-11-25 DIAGNOSIS — E785 Hyperlipidemia, unspecified: Secondary | ICD-10-CM | POA: Diagnosis not present

## 2018-11-25 DIAGNOSIS — I10 Essential (primary) hypertension: Secondary | ICD-10-CM

## 2018-11-25 LAB — CBC WITH DIFFERENTIAL/PLATELET
Absolute Monocytes: 433 cells/uL (ref 200–950)
Basophils Absolute: 18 cells/uL (ref 0–200)
Basophils Relative: 0.3 %
Eosinophils Absolute: 232 cells/uL (ref 15–500)
Eosinophils Relative: 3.8 %
HCT: 43.1 % (ref 38.5–50.0)
Hemoglobin: 14.2 g/dL (ref 13.2–17.1)
Lymphs Abs: 1720 cells/uL (ref 850–3900)
MCH: 29.4 pg (ref 27.0–33.0)
MCHC: 32.9 g/dL (ref 32.0–36.0)
MCV: 89.2 fL (ref 80.0–100.0)
MPV: 10.8 fL (ref 7.5–12.5)
Monocytes Relative: 7.1 %
Neutro Abs: 3697 cells/uL (ref 1500–7800)
Neutrophils Relative %: 60.6 %
Platelets: 269 10*3/uL (ref 140–400)
RBC: 4.83 10*6/uL (ref 4.20–5.80)
RDW: 13.3 % (ref 11.0–15.0)
Total Lymphocyte: 28.2 %
WBC: 6.1 10*3/uL (ref 3.8–10.8)

## 2018-11-25 NOTE — Progress Notes (Signed)
Name: Ian Lucas   MRN: [id number]    DOB: 08/14/1950   Date:11/25/2018       Progress Note  Subjective  Chief Complaint  Chief Complaint  Patient presents with  . Follow-up    Cholecystectomy was on July 9 due to abdominal pain. States it is still sore.     HPI  S/p cholecystectomy: bp at surgeon's office last week was high but today it is normal , normal bowel movements, no fever or chills.   DMII: he has neuropathy and dyslipidemia, on statin therapy, on metformin and lantus. Last A1C at goal, [facility]. Denies polyphagia, polydipsia or polyuria.   HTN: bp is at goal, denies chest pain of palpitation   Thrombocytopenia: we will recheck labs  Atherosclerosis of Aorta: discussed results from CT, takes aspirin and statin    Patient Active Problem List   Diagnosis Date Noted  . Elevated lipase 11/15/2018  . Generalized abdominal pain   . Chronic pain syndrome 03/08/2016  . 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 Elevated prostate specific antigen (PSA) 06/18/2015  . Benign essential HTN 06/18/2015  . Chronic midline low back pain with bilateral sciatica 06/18/2015  . Diabetic neuropathy (HCC) 06/18/2015  . Posttraumatic stress disorder 06/18/2015  . Dyslipidemia 06/18/2015  . Lichen simplex 06/18/2015  . History of herpes zoster 06/18/2015  . Restless leg 06/18/2015  . OSA (obstructive sleep apnea) 06/18/2015  . Polyneuropathy 06/18/2015  . 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 Calculus of kidney 08/21/2013  . History of pancreatitis 08/15/2013  . Hydrocele 05/19/2013  . Arthritis of knee, degenerative 10/30/2012  . Lumbar canal stenosis 10/30/2012  . Chronic prostatitis 06/19/2012  . Hypogonadism in male 06/19/2012  . 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 Benign prostatic hyperplasia with urinary obstruction 06/19/2012    Past Surgical History:  Procedure Laterality Date  . BACK SURGERY    .  CHOLECYSTECTOMY N/A 11/15/2018   Procedure: LAPAROSCOPIC CHOLECYSTECTOMY;  Surgeon: Leafy RoPabon, Diego F, MD;  Location: [facility] ORS;  Service: General;  Laterality: N/A;  . CIRCUMCISION N/A   . HERNIA REPAIR    . HYDROCELE EXCISION / REPAIR Left     Family History  Problem Relation Age of Onset  . Stroke Mother   . Diabetes Father     Social History   Socioeconomic History  . Marital status: Married    Spouse name: [name]   . Number of children: 2  . Years of education: Not on file  . Highest education level: Not on file  Occupational History  . 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 Financial resource strain: Not hard at all  . Food insecurity    Worry: Never true    Inability: Never true  . Transportation needs    Medical: No    Non-medical: No  Tobacco Use  . Smoking status: Former Smoker    Packs/day: 0.25    Years: 30.00    Pack years: 7.50    Types: Cigarettes    Start date: 05/08/1968    Quit date: 06/17/1998    Years since quitting: 20.4  . Smokeless tobacco: Never Used  Substance and Sexual Activity  . Alcohol use: No    Alcohol/week: 0.0 standard drinks  . Drug use: No  . Sexual activity: Yes    Partners: Female  Lifestyle  . Physical activity    Days per week:  0 days    Minutes per session: 0 min  . Stress: Not at all  Relationships  . Social Musician on phone: Not on file    Gets together: Not on file    Attends religious service: Not on file    Active member of club or organization: Not on file    Attends meetings of clubs or organizations: Not on file    Relationship status: Not on file  . Intimate partner violence    Fear of current or ex partner: No    Emotionally abused: No    Physically abused: No    Forced sexual activity: No  Other Topics Concern  . Not on file  Social History Narrative   Married , 28 th anniversary was in 2020   Two grown children a son and a daughter      Current  Outpatient Medications:  .  Alogliptin Benzoate 25 MG TABS, Take 25 mg by mouth daily. , Disp: , Rfl:  .  amLODipine (NORVASC) 10 MG tablet, Take 10 mg by mouth daily. , Disp: , Rfl:  .  aspirin 81 MG tablet, Take 81 mg by mouth daily., Disp: , Rfl:  .  carvedilol (COREG) 12.5 MG tablet, Take 12.5 mg by mouth 2 (two) times daily with a meal. , Disp: , Rfl:  .  DULoxetine (CYMBALTA) 60 MG capsule, Take 60 mg by mouth daily. , Disp: , Rfl:  .  ibuprofen (ADVIL,MOTRIN) 600 MG tablet, Take 1 tablet (600 mg total) by mouth every 6 (six) hours as needed. (Patient taking differently: Take 800 mg by mouth 2 (two) times daily as needed. ), Disp: 30 tablet, Rfl: 0 .  insulin glargine (LANTUS) 100 UNIT/ML injection, Inject 40 Units into the skin daily. , Disp: , Rfl:  .  losartan (COZAAR) 100 MG tablet, Take 100 mg by mouth daily. , Disp: , Rfl:  .  metFORMIN (GLUCOPHAGE) 500 MG tablet, Take 500 mg by mouth 2 (two) times daily with a meal., Disp: , Rfl:  .  pravastatin (PRAVACHOL) 80 MG tablet, Take 80 mg by mouth at bedtime., Disp: , Rfl:  .  prazosin (MINIPRESS) 1 MG capsule, Take 1 mg by mouth 3 (three) times daily., Disp: , Rfl:  .  pregabalin (LYRICA) 300 MG capsule, Take 300 mg by mouth 2 (two) times daily., Disp: , Rfl:  .  tamsulosin (FLOMAX) 0.4 MG CAPS capsule, Take 0.4 mg by mouth 2 (two) times a day. , Disp: , Rfl:  .  tiotropium (SPIRIVA) 18 MCG inhalation capsule, Place 1 capsule (18 mcg total) into inhaler and inhale daily., Disp: 30 capsule, Rfl: 6 .  buPROPion (WELLBUTRIN XL) 150 MG 24 hr tablet, Take 150 mg by mouth daily., Disp: , Rfl:  .  diclofenac sodium (VOLTAREN) 1 % GEL, Apply 2 g topically every 6 (six) hours., Disp: , Rfl:  .  hydroxychloroquine (PLAQUENIL) 200 MG tablet, Take 400 mg by mouth daily. , Disp: , Rfl:  .  Melatonin 5 MG TABS, Take 15 mg by mouth at bedtime., Disp: , Rfl:  .  sildenafil (REVATIO) 20 MG tablet, Take 20 mg by mouth as needed., Disp: , Rfl:   Allergies   Allergen Reactions  . Banana Shortness Of Breath  . Codeine Itching  . Iodine Swelling    Pt states 30-40 years ago when he had x-ray dye his lips swelled and he threw up. SPM  . Peanut Oil Shortness Of Breath  .  Ace Inhibitors Cough  . Gabapentin Other (See Comments)    Anxious and jumpy   . Iodinated Diagnostic Agents Other (See Comments)  . Metformin Diarrhea    I personally reviewed active problem list, medication list, allergies, family history, social history with the patient/caregiver today.   ROS  Constitutional: Negative for fever or weight change.  Respiratory: Negative for cough and shortness of breath.   Cardiovascular: Negative for chest pain or palpitations.  Gastrointestinal: Negative for abdominal pain, no bowel changes.  Musculoskeletal: Negative for gait problem or joint swelling.  Skin: Negative for rash.   Neurological: Negative for dizziness or headache.  No other specific complaints in a complete review of systems (except as listed in HPI above).  Objective  Vitals:   11/25/18 1114  BP: 114/62  Pulse: 83  Resp: 16  Temp: (!) 97.3 F (36.3 C)  TempSrc: Temporal  SpO2: 99%  Weight: 220 lb 1.6 oz (99.8 kg)  Height: 5\' 6"  (1.676 m)    Body mass index is 35.53 kg/m.  Physical Exam  Constitutional: Patient appears well-developed and well-nourished. Obese  No distress.  HEENT: head atraumatic, normocephalic, pupils equal and reactive to light, neck supple Cardiovascular: Normal rate, regular rhythm and normal heart sounds.  No murmur heard. No BLE edema. Pulmonary/Chest: Effort normal and breath sounds normal. No respiratory distress. Abdominal: Soft.  There is no tenderness. Psychiatric: Patient has a normal mood and affect. behavior is normal. Judgment and thought content normal.   Recent Results (from the past 2160 hour(s))  CBC with Differential     Status: Abnormal   Collection Time: 11/14/18 10:48 PM  Result Value Ref Range   WBC 5.8  4.0 - 10.5 K/uL   RBC 4.93 4.22 - 5.81 MIL/uL   Hemoglobin 14.4 13.0 - 17.0 g/dL   HCT 43.3 39.0 - 52.0 %   MCV 87.8 80.0 - 100.0 fL   MCH 29.2 26.0 - 34.0 pg   MCHC 33.3 30.0 - 36.0 g/dL   RDW 14.0 11.5 - 15.5 %   Platelets 141 (L) 150 - 400 K/uL   nRBC 0.0 0.0 - 0.2 %   Neutrophils Relative % 57 %   Neutro Abs 3.3 1.7 - 7.7 K/uL   Lymphocytes Relative 33 %   Lymphs Abs 1.9 0.7 - 4.0 K/uL   Monocytes Relative 8 %   Monocytes Absolute 0.5 0.1 - 1.0 K/uL   Eosinophils Relative 2 %   Eosinophils Absolute 0.1 0.0 - 0.5 K/uL   Basophils Relative 0 %   Basophils Absolute 0.0 0.0 - 0.1 K/uL   Immature Granulocytes 0 %   Abs Immature Granulocytes 0.02 0.00 - 0.07 K/uL    Comment: Performed at [facility], [address] [place], [place] [address]  Comprehensive metabolic panel     Status: Abnormal   Collection Time: 11/14/18 10:48 PM  Result Value Ref Range   Sodium 140 135 - 145 mmol/L   Potassium 4.0 3.5 - 5.1 mmol/L   Chloride 106 98 - 111 mmol/L   CO2 25 22 - 32 mmol/L   Glucose, Bld 193 (H) 70 - 99 mg/dL   BUN 17 8 - 23 mg/dL   Creatinine, Ser 1.19 0.61 - 1.24 mg/dL   Calcium 9.1 8.9 - 10.3 mg/dL   Total Protein 7.5 6.5 - 8.1 g/dL   Albumin 3.8 3.5 - 5.0 g/dL   AST 22 15 - 41 U/L   ALT 17 0 - 44 U/L   Alkaline  Phosphatase 61 38 - 126 U/L   Total Bilirubin 0.7 0.3 - 1.2 mg/dL   GFR calc non Af Amer >60 >60 mL/min   GFR calc Af Amer >60 >60 mL/min   Anion gap 9 5 - 15    Comment: Performed at [facility], [address] [place], [place] [address]  Lipase, blood     Status: Abnormal   Collection Time: 11/14/18 10:48 PM  Result Value Ref Range   Lipase 92 (H) 11 - 51 U/L    Comment: Performed at [facility], [address] [place], [place] [address]  Troponin I (High Sensitivity)     Status: None   Collection Time: 11/14/18 10:48 PM  Result Value Ref Range   Troponin I (High Sensitivity) 6 <18 ng/L    Comment: (NOTE) Elevated high  sensitivity troponin I (hsTnI) values and significant  changes across serial measurements may suggest ACS but many other  chronic and acute conditions are known to elevate hsTnI results.  Refer to the "Links" section for chest pain algorithms and additional  guidance. Performed at [facility], [address], [place], [place] [address]   SARS Coronavirus 2 (CEPHEID - Performed in [facility] hospital lab), Hosp Order     Status: None   Collection Time: 11/14/18 10:48 PM   Specimen: Nasopharyngeal Swab  Result Value Ref Range   SARS Coronavirus 2 NEGATIVE NEGATIVE    Comment: (NOTE) If result is NEGATIVE SARS-CoV-2 target nucleic acids are NOT DETECTED. The SARS-CoV-2 RNA is generally detectable in upper and lower  respiratory specimens during the acute phase of infection. The lowest  concentration of SARS-CoV-2 viral copies this assay can detect is 250  copies / mL. A negative result does not preclude SARS-CoV-2 infection  and should not be used as the sole basis for treatment or other  patient management decisions.  A negative result may occur with  improper specimen collection / handling, submission of specimen other  than nasopharyngeal swab, presence of viral mutation(s) within the  areas targeted by this assay, and inadequate number of viral copies  (<250 copies / mL). A negative result must be combined with clinical  observations, patient history, and epidemiological information. If result is POSITIVE SARS-CoV-2 target nucleic acids are DETECTED. The SARS-CoV-2 RNA is generally detectable in upper and lower  respiratory specimens dur ing the acute phase of infection.  Positive  results are indicative of active infection with SARS-CoV-2.  Clinical  correlation with patient history and other diagnostic information is  necessary to determine patient infection status.  Positive results do  not rule out bacterial infection or co-infection with other viruses. If result  is PRESUMPTIVE POSTIVE SARS-CoV-2 nucleic acids MAY BE PRESENT.   A presumptive positive result was obtained on the submitted specimen  and confirmed on repeat testing.  While 2019 novel coronavirus  (SARS-CoV-2) nucleic acids may be present in the submitted sample  additional confirmatory testing may be necessary for epidemiological  and / or clinical management purposes  to differentiate between  SARS-CoV-2 and other Sarbecovirus currently known to infect humans.  If clinically indicated additional testing with an alternate test  methodology [phone]) is advised. The SARS-CoV-2 RNA is generally  detectable in upper and lower respiratory sp ecimens during the acute  phase of infection. The expected result is Negative. Fact Sheet for Patients:  BoilerBrush.com.cy Fact Sheet for Healthcare Providers: https://pope.com/ This test is not yet approved or cleared by the [place] FDA and  has been authorized for detection and/or diagnosis of SARS-CoV-2 by FDA under an Emergency Use Authorization (EUA).  This EUA will remain in effect (meaning this test can be used) for the duration of the COVID-19 declaration under Section 564(b)(1) of the Act, 21 U.S.C. section 360bbb-3(b)(1), unless the authorization is terminated or revoked sooner. Performed at [facility], [address] [place], [place] [address]   Lipid panel     Status: Abnormal   Collection Time: 11/15/18  5:44 AM  Result Value Ref Range   Cholesterol 202 (H) 0 - 200 mg/dL   Triglycerides 57 <604 mg/dL   HDL 62 >54 mg/dL   Total CHOL/HDL Ratio 3.3 RATIO   VLDL 11 0 - 40 mg/dL   LDL Cholesterol 098 (H) 0 - 99 mg/dL    Comment:        Total Cholesterol/HDL:CHD Risk Coronary Heart Disease Risk Table                     Men   Women  1/2 Average Risk   3.4   3.3  Average Risk       5.0   4.4  2 X Average Risk   9.6   7.1  3 X Average Risk  23.4   11.0        Use  the calculated Patient Ratio above and the CHD Risk Table to determine the patient's CHD Risk.        ATP III CLASSIFICATION (LDL):  <100     mg/dL   Optimal  119-147  mg/dL   Near or Above                    Optimal  130-159  mg/dL   Borderline  829-562  mg/dL   High  >130     mg/dL   Very High Performed at [facility], [address] [place], [place] [address]   TSH     Status: None   Collection Time: 11/15/18  5:44 AM  Result Value Ref Range   TSH 2.321 0.350 - 4.500 uIU/mL    Comment: Performed by a 3rd Generation assay with a functional sensitivity of <=0.01 uIU/mL. Performed at [facility], [address] [place], [place] [address]   Hemoglobin A1c     Status: Abnormal   Collection Time: 11/15/18  5:44 AM  Result Value Ref Range   Hgb A1c MFr Bld 6.0 (H) 4.8 - 5.6 %    Comment: (NOTE)         Prediabetes: 5.7 - 6.4         Diabetes: >6.4         Glycemic control for adults with diabetes: <7.0    Mean Plasma Glucose 126 mg/dL    Comment: (NOTE) Performed At: [facility] [address] [place], [place] [id number] Jolene Schimke MD MW:[id number]   Glucose, capillary     Status: Abnormal   Collection Time: 11/15/18  2:23 PM  Result Value Ref Range   Glucose-Capillary 143 (H) 70 - 99 mg/dL  Glucose, capillary     Status: Abnormal   Collection Time: 11/15/18  4:44 PM  Result Value Ref Range   Glucose-Capillary 131 (H) 70 - 99 mg/dL  Surgical pathology     Status: None   Collection Time: 11/15/18  7:32 PM  Result Value Ref Range   SURGICAL PATHOLOGY      Surgical Pathology CASE: [id number] PATIENT: Pearse Mortell  Surgical Pathology Report     SPECIMEN SUBMITTED: A. Gallbladder  CLINICAL HISTORY: None provided  PRE-OPERATIVE DIAGNOSIS: None provided  POST-OPERATIVE DIAGNOSIS: None provided.    DIAGNOSIS: A. GALLBLADDER; CHOLECYSTECTOMY: - CHOLELITHIASIS AND GANGRENOUS CHOLECYSTITIS.  Comment: The gallbladder  is necrotic, so the mucosa cannot be assessed for dysplasia. No neoplasm is recognizable.  GROSS DESCRIPTION: A. Labeled: Gallbladder Received: In formalin Size of specimen: 8.3 x 4.2 x 2.7 cm Specimen integrity: Torn open External surface: Markedly discolored green-gray and irregular Wall thickness: 0.1-0.2 cm Mucosa: Green-brown irregular and roughened Cystic duct: Cannot be grossly identified Bile present: Yes, yellow thickened bile Stones present: Yes, numerous bright yellow to brown calculi from 0.2 to 0.7 cm in diameter Other findings: None  Block summary: 1 -representative secti ons   Final Diagnosis performed by Jamal LoboMary Olney, MD.   Electronically signed 11/19/2018 8:02:30PM The electronic signature indicates that the named Attending Pathologist has evaluated the specimen  Technical component performed at [facility], [address], [place], [place] [address] Lab: [phone] Dir: Jolene SchimkeSanjai Nagendra, MD, MMM  Professional component performed at [facility], [facility], [address], [place], [place] [address] Lab: [phone] Dir: Georgiann Cockerara C. Rubinas, MD   Glucose, capillary     Status: Abnormal   Collection Time: 11/15/18  8:37 PM  Result Value Ref Range   Glucose-Capillary 157 (H) 70 - 99 mg/dL   Comment 1 Notify RN    Comment 2 Document in Chart   Glucose, capillary     Status: Abnormal   Collection Time: 11/15/18 11:50 PM  Result Value Ref Range   Glucose-Capillary 168 (H) 70 - 99 mg/dL  Glucose, capillary     Status: Abnormal   Collection Time: 11/16/18  4:06 AM  Result Value Ref Range   Glucose-Capillary 171 (H) 70 - 99 mg/dL  CBC     Status: Abnormal   Collection Time: 11/16/18  5:15 AM  Result Value Ref Range   WBC 13.5 (H) 4.0 - 10.5 K/uL   RBC 4.98 4.22 - 5.81 MIL/uL   Hemoglobin 14.6 13.0 - 17.0 g/dL   HCT 64.344.3 32.939.0 - 51.852.0 %   MCV 89.0 80.0 - 100.0 fL   MCH 29.3 26.0 - 34.0 pg   MCHC 33.0 30.0 - 36.0 g/dL   RDW 84.114.2 66.011.5 - 63.015.5 %    Platelets 125 (L) 150 - 400 K/uL   nRBC 0.0 0.0 - 0.2 %    Comment: Performed at [facility], [address] [place], [place] [address]  Comprehensive metabolic panel     Status: Abnormal   Collection Time: 11/16/18  5:15 AM  Result Value Ref Range   Sodium 139 135 - 145 mmol/L   Potassium 4.0 3.5 - 5.1 mmol/L   Chloride 105 98 - 111 mmol/L   CO2 23 22 - 32 mmol/L   Glucose, Bld 196 (H) 70 - 99 mg/dL   BUN 13 8 - 23 mg/dL   Creatinine, Ser 9.320.97 0.61 - 1.24 mg/dL   Calcium 8.6 (L) 8.9 - 10.3 mg/dL   Total Protein 7.6 6.5 - 8.1 g/dL   Albumin 3.7 3.5 - 5.0 g/dL   AST 86 (H) 15 - 41 U/L   ALT 82 (H) 0 - 44 U/L   Alkaline Phosphatase 52 38 - 126 U/L   Total Bilirubin 1.0 0.3 - 1.2 mg/dL   GFR calc non Af Amer >60 >60 mL/min   GFR calc Af Amer >60 >60 mL/min   Anion gap 11  5 - 15    Comment: Performed at [facility], [address] [place], [place] [address]  Glucose, capillary     Status: Abnormal   Collection Time: 11/16/18  7:37 AM  Result Value Ref Range   Glucose-Capillary 181 (H) 70 - 99 mg/dL  Glucose, capillary     Status: Abnormal   Collection Time: 11/16/18 11:43 AM  Result Value Ref Range   Glucose-Capillary 165 (H) 70 - 99 mg/dL  Glucose, capillary     Status: Abnormal   Collection Time: 11/16/18  4:19 PM  Result Value Ref Range   Glucose-Capillary 104 (H) 70 - 99 mg/dL  Glucose, capillary     Status: Abnormal   Collection Time: 11/16/18  7:50 PM  Result Value Ref Range   Glucose-Capillary 132 (H) 70 - 99 mg/dL  Glucose, capillary     Status: Abnormal   Collection Time: 11/17/18 12:23 AM  Result Value Ref Range   Glucose-Capillary 108 (H) 70 - 99 mg/dL  Glucose, capillary     Status: Abnormal   Collection Time: 11/17/18  4:27 AM  Result Value Ref Range   Glucose-Capillary 104 (H) 70 - 99 mg/dL  Comprehensive metabolic panel     Status: Abnormal   Collection Time: 11/17/18  5:02 AM  Result Value Ref Range   Sodium 140 135 - 145 mmol/L    Potassium 3.5 3.5 - 5.1 mmol/L   Chloride 106 98 - 111 mmol/L   CO2 27 22 - 32 mmol/L   Glucose, Bld 111 (H) 70 - 99 mg/dL   BUN 14 8 - 23 mg/dL   Creatinine, Ser 9.51 0.61 - 1.24 mg/dL   Calcium 8.5 (L) 8.9 - 10.3 mg/dL   Total Protein 6.7 6.5 - 8.1 g/dL   Albumin 3.2 (L) 3.5 - 5.0 g/dL   AST 51 (H) 15 - 41 U/L   ALT 56 (H) 0 - 44 U/L   Alkaline Phosphatase 41 38 - 126 U/L   Total Bilirubin 1.0 0.3 - 1.2 mg/dL   GFR calc non Af Amer >60 >60 mL/min   GFR calc Af Amer >60 >60 mL/min   Anion gap 7 5 - 15    Comment: Performed at [facility], [address] [place], [place] [address]  CBC     Status: Abnormal   Collection Time: 11/17/18  5:02 AM  Result Value Ref Range   WBC 9.2 4.0 - 10.5 K/uL   RBC 4.39 4.22 - 5.81 MIL/uL   Hemoglobin 12.8 (L) 13.0 - 17.0 g/dL   HCT 60.6 30.1 - 60.1 %   MCV 89.5 80.0 - 100.0 fL   MCH 29.2 26.0 - 34.0 pg   MCHC 32.6 30.0 - 36.0 g/dL   RDW 09.3 23.5 - 57.3 %   Platelets 120 (L) 150 - 400 K/uL   nRBC 0.0 0.0 - 0.2 %    Comment: Performed at [facility], [address] [place], [place] [address]  Lipase, blood     Status: Abnormal   Collection Time: 11/17/18  5:02 AM  Result Value Ref Range   Lipase 56 (H) 11 - 51 U/L    Comment: Performed at [facility], [address] [place], [place] [address]  Magnesium     Status: None   Collection Time: 11/17/18  5:02 AM  Result Value Ref Range   Magnesium 1.8 1.7 - 2.4 mg/dL    Comment: Performed at [facility], [address]  [place], [place] [address]  Glucose, capillary     Status: Abnormal   Collection Time: 11/17/18  7:41 AM  Result Value Ref Range   Glucose-Capillary 114 (H) 70 - 99 mg/dL    Diabetic Foot Exam: Diabetic Foot Exam - Simple   Simple Foot Form Visual Inspection No deformities, no ulcerations, no other skin breakdown bilaterally: Yes Sensation Testing See comments: Yes Pulse Check Posterior Tibialis and Dorsalis pulse intact  bilaterally: Yes Comments Decrease in sensation       PHQ2/9: Depression screen [facility] 2/9 11/25/2018 05/29/2016 03/14/2016 12/06/2015 10/06/2015  Decreased Interest 0 0 0 0 0  Down, Depressed, Hopeless 0 0 0 0 0  PHQ - 2 Score 0 0 0 0 0  Altered sleeping 0 - - - -  Tired, decreased energy 0 - - - -  Change in appetite 0 - - - -  Feeling bad or failure about yourself  0 - - - -  Trouble concentrating 0 - - - -  Moving slowly or fidgety/restless 0 - - - -  Suicidal thoughts 0 - - - -  PHQ-9 Score 0 - - - -  Difficult doing work/chores Not difficult at all - - - -    phq 9 is negative   Fall Risk: Fall Risk  11/25/2018 11/20/2018 05/29/2016 03/14/2016 12/06/2015  Falls in the past year? 0 0 Yes No No  Number falls in past yr: 0 0 2 or more - -  Comment - - - - -  Injury with Fall? 0 - No - -    Functional Status Survey: Is the patient deaf or have difficulty hearing?: No Does the patient have difficulty seeing, even when wearing glasses/contacts?: Yes Does the patient have difficulty concentrating, remembering, or making decisions?: No Does the patient have difficulty walking or climbing stairs?: No Does the patient have difficulty dressing or bathing?: No Does the patient have difficulty doing errands alone such as visiting a doctor's office or shopping?: No    Assessment & Plan  1. Thrombocytopenia (HCC)  - CBC with Differential/Platelet  2. Need for vaccination for pneumococcus  - Pneumococcal polysaccharide vaccine 23-valent greater than or equal to 2yo subcutaneous/IM  3. Diabetic polyneuropathy associated with type 2 diabetes mellitus (HCC)  Goes to the [place] , A1C done at [facility] was normal  4. Atherosclerosis of aorta (HCC)  Discussed CT results, on statin therapy    5. Dyslipidemia  Continue statin therapy   6. Benign essential HTN  At goal

## 2019-01-14 ENCOUNTER — Ambulatory Visit (INDEPENDENT_AMBULATORY_CARE_PROVIDER_SITE_OTHER): Payer: Medicare Other

## 2019-01-14 ENCOUNTER — Other Ambulatory Visit: Payer: Self-pay

## 2019-01-14 DIAGNOSIS — Z23 Encounter for immunization: Secondary | ICD-10-CM | POA: Diagnosis not present

## 2019-04-24 DIAGNOSIS — Z0101 Encounter for examination of eyes and vision with abnormal findings: Secondary | ICD-10-CM | POA: Diagnosis not present

## 2019-04-24 DIAGNOSIS — E119 Type 2 diabetes mellitus without complications: Secondary | ICD-10-CM | POA: Diagnosis not present

## 2019-09-11 DIAGNOSIS — H2513 Age-related nuclear cataract, bilateral: Secondary | ICD-10-CM | POA: Diagnosis not present

## 2020-03-19 DIAGNOSIS — H04123 Dry eye syndrome of bilateral lacrimal glands: Secondary | ICD-10-CM | POA: Diagnosis not present

## 2020-03-29 DIAGNOSIS — H2511 Age-related nuclear cataract, right eye: Secondary | ICD-10-CM | POA: Diagnosis not present

## 2020-04-14 ENCOUNTER — Other Ambulatory Visit: Payer: Self-pay

## 2020-04-14 ENCOUNTER — Encounter: Payer: Self-pay | Admitting: Ophthalmology

## 2020-04-14 DIAGNOSIS — H2512 Age-related nuclear cataract, left eye: Secondary | ICD-10-CM | POA: Diagnosis not present

## 2020-04-16 ENCOUNTER — Other Ambulatory Visit
Admission: RE | Admit: 2020-04-16 | Discharge: 2020-04-16 | Disposition: A | Discharge status: Home / Self Care | Payer: Medicare Other | Source: Ambulatory Visit | Attending: Ophthalmology | Admitting: Ophthalmology

## 2020-04-16 ENCOUNTER — Other Ambulatory Visit: Payer: Self-pay

## 2020-04-16 DIAGNOSIS — Z01812 Encounter for preprocedural laboratory examination: Secondary | ICD-10-CM | POA: Insufficient documentation

## 2020-04-16 DIAGNOSIS — Z20822 Contact with and (suspected) exposure to covid-19: Secondary | ICD-10-CM | POA: Insufficient documentation

## 2020-04-16 NOTE — Discharge Instructions (Signed)
General Anesthesia, Adult, Care After This sheet gives you information about how to care for yourself after your procedure. Your health care provider may also give you more specific instructions. If you have problems or questions, contact your health care provider. What can I expect after the procedure? After the procedure, the following side effects are common:  Pain or discomfort at the IV site.  Nausea.  Vomiting.  Sore throat.  Trouble concentrating.  Feeling cold or chills.  Weak or tired.  Sleepiness and fatigue.  Soreness and body aches. These side effects can affect parts of the body that were not involved in surgery. Follow these instructions at home:  For at least 24 hours after the procedure:  Have a responsible adult stay with you. It is important to have someone help care for you until you are awake and alert.  Rest as needed.  Do not: ? Participate in activities in which you could fall or become injured. ? Drive. ? Use heavy machinery. ? Drink alcohol. ? Take sleeping pills or medicines that cause drowsiness. ? Make important decisions or sign legal documents. ? Take care of children on your own. Eating and drinking  Follow any instructions from your health care provider about eating or drinking restrictions.  When you feel hungry, start by eating small amounts of foods that are soft and easy to digest (bland), such as toast. Gradually return to your regular diet.  Drink enough fluid to keep your urine pale yellow.  If you vomit, rehydrate by drinking water, juice, or clear broth. General instructions  If you have sleep apnea, surgery and certain medicines can increase your risk for breathing problems. Follow instructions from your health care provider about wearing your sleep device: ? Anytime you are sleeping, including during daytime naps. ? While taking prescription pain medicines, sleeping medicines, or medicines that make you drowsy.  Return to  your normal activities as told by your health care provider. Ask your health care provider what activities are safe for you.  Take over-the-counter and prescription medicines only as told by your health care provider.  If you smoke, do not smoke without supervision.  Keep all follow-up visits as told by your health care provider. This is important. Contact a health care provider if:  You have nausea or vomiting that does not get better with medicine.  You cannot eat or drink without vomiting.  You have pain that does not get better with medicine.  You are unable to pass urine.  You develop a skin rash.  You have a fever.  You have redness around your IV site that gets worse. Get help right away if:  You have difficulty breathing.  You have chest pain.  You have blood in your urine or stool, or you vomit blood. Summary  After the procedure, it is common to have a sore throat or nausea. It is also common to feel tired.  Have a responsible adult stay with you for the first 24 hours after general anesthesia. It is important to have someone help care for you until you are awake and alert.  When you feel hungry, start by eating small amounts of foods that are soft and easy to digest (bland), such as toast. Gradually return to your regular diet.  Drink enough fluid to keep your urine pale yellow.  Return to your normal activities as told by your health care provider. Ask your health care provider what activities are safe for you. This information is not   intended to replace advice given to you by your health care provider. Make sure you discuss any questions you have with your health care provider. Document Revised: 04/27/2017 Document Reviewed: 12/08/2016 Elsevier Patient Education  2020 Elsevier Inc[employer]  Cataract Surgery, Care After This sheet gives you information about how to care for yourself after your procedure. Your health care provider may also give you more specific  instructions. If you have problems or questions, contact your health care provider. What can I expect after the procedure? After the procedure, it is common to have:  Itching.  Discomfort.  Fluid discharge.  Sensitivity to light and to touch.  Bruising in or around the eye.  Mild blurred vision. Follow these instructions at home: Eye care   Do not touch or rub your eyes.  Protect your eyes as told by your health care provider. You may be told to wear a protective eye shield or sunglasses.  Do not put a contact lens into the affected eye or eyes until your health care provider approves.  Keep the area around your eye clean and dry: ? Avoid swimming. ? Do not allow water to hit you directly in the face while showering. ? Keep soap and shampoo out of your eyes.  Check your eye every day for signs of infection. Watch for: ? Redness, swelling, or pain. ? Fluid, blood, or pus. ? Warmth. ? A bad smell. ? Vision that is getting worse. ? Sensitivity that is getting worse. Activity  Do not drive for 24 hours if you were given a sedative during your procedure.  Avoid strenuous activities, such as playing contact sports, for as long as told by your health care provider.  Do not drive or use heavy machinery until your health care provider approves.  Do not bend or lift heavy objects. Bending increases pressure in the eye. You can walk, climb stairs, and do light household chores.  Ask your health care provider when you can return to work. If you work in a dusty environment, you may be advised to wear protective eyewear for a period of time. General instructions  Take or apply over-the-counter and prescription medicines only as told by your health care provider. This includes eye drops.  Keep all follow-up visits as told by your health care provider. This is important. Contact a health care provider if:  You have increased bruising around your eye.  You have pain that is  not helped with medicine.  You have a fever.  You have redness, swelling, or pain in your eye.  You have fluid, blood, or pus coming from your incision.  Your vision gets worse.  Your sensitivity to light gets worse. Get help right away if:  You have sudden loss of vision.  You see flashes of light or spots (floaters).  You have severe eye pain.  You develop nausea or vomiting. Summary  After your procedure, it is common to have itching, discomfort, bruising, fluid discharge, or sensitivity to light.  Follow instructions from your health care provider about caring for your eye after the procedure.  Do not rub your eye after the procedure. You may need to wear eye protection or sunglasses. Do not wear contact lenses. Keep the area around your eye clean and dry.  Avoid activities that require a lot of effort. These include playing sports and lifting heavy objects.  Contact a health care provider if you have increased bruising, pain that does not go away, or a fever. Get help right   away if you suddenly lose your vision, see flashes of light or spots, or have severe pain in the eye. This information is not intended to replace advice given to you by your health care provider. Make sure you discuss any questions you have with your health care provider. Document Revised: 02/18/2019 Document Reviewed: 10/22/2017 Elsevier Patient Education  2020 Elsevier Inc.  

## 2020-04-17 LAB — SARS CORONAVIRUS 2 (TAT 6-24 HRS): SARS Coronavirus 2: NEGATIVE

## 2020-04-20 ENCOUNTER — Encounter
Admission: RE | Disposition: A | Discharge status: Home / Self Care | Payer: Self-pay | Source: Home / Self Care | Attending: Ophthalmology

## 2020-04-20 ENCOUNTER — Ambulatory Visit
Admission: RE | Admit: 2020-04-20 | Discharge: 2020-04-20 | Disposition: A | Discharge status: Home / Self Care | Payer: Medicare Other | Attending: Ophthalmology | Admitting: Ophthalmology

## 2020-04-20 ENCOUNTER — Encounter: Payer: Self-pay | Admitting: Ophthalmology

## 2020-04-20 ENCOUNTER — Ambulatory Visit: Payer: Medicare Other | Admitting: Anesthesiology

## 2020-04-20 ENCOUNTER — Other Ambulatory Visit: Payer: Self-pay

## 2020-04-20 DIAGNOSIS — Z79899 Other long term (current) drug therapy: Secondary | ICD-10-CM | POA: Insufficient documentation

## 2020-04-20 DIAGNOSIS — E1136 Type 2 diabetes mellitus with diabetic cataract: Secondary | ICD-10-CM | POA: Insufficient documentation

## 2020-04-20 DIAGNOSIS — H2512 Age-related nuclear cataract, left eye: Secondary | ICD-10-CM | POA: Diagnosis not present

## 2020-04-20 DIAGNOSIS — Z9109 Other allergy status, other than to drugs and biological substances: Secondary | ICD-10-CM | POA: Diagnosis not present

## 2020-04-20 DIAGNOSIS — Z7984 Long term (current) use of oral hypoglycemic drugs: Secondary | ICD-10-CM | POA: Diagnosis not present

## 2020-04-20 DIAGNOSIS — Z885 Allergy status to narcotic agent status: Secondary | ICD-10-CM | POA: Diagnosis not present

## 2020-04-20 DIAGNOSIS — Z87891 Personal history of nicotine dependence: Secondary | ICD-10-CM | POA: Diagnosis not present

## 2020-04-20 DIAGNOSIS — Z833 Family history of diabetes mellitus: Secondary | ICD-10-CM | POA: Insufficient documentation

## 2020-04-20 DIAGNOSIS — Z791 Long term (current) use of non-steroidal anti-inflammatories (NSAID): Secondary | ICD-10-CM | POA: Diagnosis not present

## 2020-04-20 DIAGNOSIS — Z91010 Allergy to peanuts: Secondary | ICD-10-CM | POA: Insufficient documentation

## 2020-04-20 DIAGNOSIS — H25812 Combined forms of age-related cataract, left eye: Secondary | ICD-10-CM | POA: Diagnosis not present

## 2020-04-20 DIAGNOSIS — Z794 Long term (current) use of insulin: Secondary | ICD-10-CM | POA: Insufficient documentation

## 2020-04-20 HISTORY — DX: Unspecified osteoarthritis, unspecified site: M19.90

## 2020-04-20 HISTORY — DX: Personal history of urinary calculi: Z87.442

## 2020-04-20 HISTORY — DX: Other complications of anesthesia, initial encounter: T88.59XA

## 2020-04-20 HISTORY — DX: Dizziness and giddiness: R42

## 2020-04-20 HISTORY — PX: CATARACT EXTRACTION W/PHACO: SHX586

## 2020-04-20 HISTORY — DX: Sleep apnea, unspecified: G47.30

## 2020-04-20 HISTORY — DX: Personal history of other infectious and parasitic diseases: Z86.19

## 2020-04-20 LAB — GLUCOSE, CAPILLARY
Glucose-Capillary: 168 mg/dL — ABNORMAL HIGH (ref 70–99)
Glucose-Capillary: 175 mg/dL — ABNORMAL HIGH (ref 70–99)

## 2020-04-20 SURGERY — PHACOEMULSIFICATION, CATARACT, WITH IOL INSERTION
Anesthesia: Monitor Anesthesia Care | Site: Eye | Laterality: Left

## 2020-04-20 MED ORDER — MIDAZOLAM HCL 2 MG/2ML IJ SOLN
INTRAMUSCULAR | Status: DC | PRN
  Administered 2020-04-20: 1 mg via INTRAVENOUS

## 2020-04-20 MED ORDER — ARMC OPHTHALMIC DILATING DROPS
1.0000 "application " | OPHTHALMIC | Status: DC | PRN
  Administered 2020-04-20 (×3): 1 via OPHTHALMIC

## 2020-04-20 MED ORDER — OXYCODONE HCL 5 MG PO TABS: 5.0000 mg | ORAL_TABLET | Freq: Once | ORAL | Status: DC | PRN

## 2020-04-20 MED ORDER — MOXIFLOXACIN HCL 0.5 % OP SOLN
OPHTHALMIC | Status: DC | PRN
  Administered 2020-04-20: 0.2 mL via OPHTHALMIC

## 2020-04-20 MED ORDER — OXYCODONE HCL 5 MG/5ML PO SOLN
5.0000 mg | Freq: Once | ORAL | Status: DC | PRN
Start: 2020-04-20 — End: 2020-04-20

## 2020-04-20 MED ORDER — PROMETHAZINE HCL 25 MG/ML IJ SOLN: 6.2500 mg | INTRAMUSCULAR | Status: DC | PRN

## 2020-04-20 MED ORDER — MEPERIDINE HCL 25 MG/ML IJ SOLN
6.2500 mg | INTRAMUSCULAR | Status: DC | PRN
Start: 2020-04-20 — End: 2020-04-20

## 2020-04-20 MED ORDER — BRIMONIDINE TARTRATE-TIMOLOL 0.2-0.5 % OP SOLN
OPHTHALMIC | Status: DC | PRN
  Administered 2020-04-20: 1 [drp] via OPHTHALMIC

## 2020-04-20 MED ORDER — LIDOCAINE HCL (PF) 2 % IJ SOLN
INTRAOCULAR | Status: DC | PRN
  Administered 2020-04-20: 2 mL

## 2020-04-20 MED ORDER — EPINEPHRINE PF 1 MG/ML IJ SOLN
INTRAOCULAR | Status: DC | PRN
  Administered 2020-04-20: 08:00:00 75 mL via OPHTHALMIC

## 2020-04-20 MED ORDER — TETRACAINE HCL 0.5 % OP SOLN
1.0000 [drp] | OPHTHALMIC | Status: DC | PRN
  Administered 2020-04-20 (×3): 1 [drp] via OPHTHALMIC

## 2020-04-20 MED ORDER — LACTATED RINGERS IV SOLN: INTRAVENOUS | Status: DC

## 2020-04-20 MED ORDER — NA CHONDROIT SULF-NA HYALURON 40-17 MG/ML IO SOLN
INTRAOCULAR | Status: DC | PRN
  Administered 2020-04-20: 1 mL via INTRAOCULAR

## 2020-04-20 MED ORDER — FENTANYL CITRATE (PF) 100 MCG/2ML IJ SOLN
INTRAMUSCULAR | Status: DC | PRN
  Administered 2020-04-20: 50 ug via INTRAVENOUS

## 2020-04-20 MED ORDER — FENTANYL CITRATE (PF) 100 MCG/2ML IJ SOLN
25.0000 ug | INTRAMUSCULAR | Status: DC | PRN
Start: 2020-04-20 — End: 2020-04-20

## 2020-04-20 SURGICAL SUPPLY — 19 items
CANNULA ANT/CHMB 27G (MISCELLANEOUS) ×2 IMPLANT
CANNULA ANT/CHMB 27GA (MISCELLANEOUS) ×6 IMPLANT
GLOVE SURG LX 8.0 MICRO (GLOVE) ×2
GLOVE SURG LX STRL 8.0 MICRO (GLOVE) ×1 IMPLANT
GLOVE SURG TRIUMPH 8.0 PF LTX (GLOVE) ×3 IMPLANT
GOWN STRL REUS W/ TWL LRG LVL3 (GOWN DISPOSABLE) ×2 IMPLANT
GOWN STRL REUS W/TWL LRG LVL3 (GOWN DISPOSABLE) ×6
LENS IOL TECNIS EYHANCE 21.5 (Intraocular Lens) ×2 IMPLANT
MARKER SKIN DUAL TIP RULER LAB (MISCELLANEOUS) ×3 IMPLANT
NDL FILTER BLUNT 18X1 1/2 (NEEDLE) ×1 IMPLANT
NEEDLE FILTER BLUNT 18X 1/2SAF (NEEDLE) ×2
NEEDLE FILTER BLUNT 18X1 1/2 (NEEDLE) ×1 IMPLANT
PACK EYE AFTER SURG (MISCELLANEOUS) ×3 IMPLANT
PACK OPTHALMIC (MISCELLANEOUS) ×3 IMPLANT
PACK PORFILIO (MISCELLANEOUS) ×3 IMPLANT
SYR 3ML LL SCALE MARK (SYRINGE) ×3 IMPLANT
SYR TB 1ML LUER SLIP (SYRINGE) ×3 IMPLANT
WATER STERILE IRR 250ML POUR (IV SOLUTION) ×3 IMPLANT
WIPE NON LINTING 3.25X3.25 (MISCELLANEOUS) ×3 IMPLANT

## 2020-04-20 NOTE — Anesthesia Procedure Notes (Signed)
Procedure Name: MAC Date/Time: 04/20/2020 7:32 AM Performed by: Cameron Ali, CRNA Pre-anesthesia Checklist: Patient identified, Emergency Drugs available, Suction available, Timeout performed and Patient being monitored Patient Re-evaluated:Patient Re-evaluated prior to induction Oxygen Delivery Method: Nasal cannula Placement Confirmation: positive ETCO2

## 2020-04-20 NOTE — Transfer of Care (Signed)
Immediate Anesthesia Transfer of Care Note  Patient: Ian Lucas  Procedure(s) Performed: CATARACT EXTRACTION PHACO AND INTRAOCULAR LENS PLACEMENT (IOC) LEFT DIABETIC 4.37 00:48.7 (Left Eye)  Patient Location: PACU  Anesthesia Type: MAC  Level of Consciousness: awake, alert  and patient cooperative  Airway and Oxygen Therapy: Patient Spontanous Breathing and Patient connected to supplemental oxygen  Post-op Assessment: Post-op Vital signs reviewed, Patient's Cardiovascular Status Stable, Respiratory Function Stable, Patent Airway and No signs of Nausea or vomiting  Post-op Vital Signs: Reviewed and stable  Complications: No complications documented.

## 2020-04-20 NOTE — Anesthesia Postprocedure Evaluation (Signed)
Anesthesia Post Note  Patient: Ian Lucas  Procedure(s) Performed: CATARACT EXTRACTION PHACO AND INTRAOCULAR LENS PLACEMENT (IOC) LEFT DIABETIC 4.37 00:48.7 (Left Eye)     Patient location during evaluation: PACU Anesthesia Type: MAC Level of consciousness: awake and alert Pain management: pain level controlled Vital Signs Assessment: post-procedure vital signs reviewed and stable Respiratory status: spontaneous breathing, nonlabored ventilation, respiratory function stable and patient connected to nasal cannula oxygen Cardiovascular status: stable and blood pressure returned to baseline Postop Assessment: no apparent nausea or vomiting Anesthetic complications: no   No complications documented.  Krystyl Cannell, Glade Stanford

## 2020-04-20 NOTE — H&P (Signed)
[facility]   Primary Care Physician:  Alba Cory, MD Ophthalmologist: Dr. Druscilla Brownie  Pre-Procedure History & Physical: HPI:  Ian Lucas is a 69 y.o. male here for cataract surgery.   Past Medical History:  Diagnosis Date  . Arthritis    Rheumatoid  . Complication of anesthesia    has become combative following surgery  . Diabetes mellitus without complication (HCC)    Type 2  . History of kidney stones    x 2  . History of shingles   . Hypercholesteremia   . Hypertension   . Neuropathy   . Pancreatitis   . PTSD (post-traumatic stress disorder)   . Sleep apnea    CPAP machine  . Vertigo    occasional    Past Surgical History:  Procedure Laterality Date  . BACK SURGERY     x 2  . CHOLECYSTECTOMY N/A 11/15/2018   Procedure: LAPAROSCOPIC CHOLECYSTECTOMY;  Surgeon: Leafy Ro, MD;  Location: [facility] ORS;  Service: General;  Laterality: N/A;  . CIRCUMCISION N/A   . COLONOSCOPY    . HERNIA REPAIR     [place]atal  . HYDROCELE EXCISION / REPAIR Left     Prior to Admission medications   Medication Sig Start Date End Date Taking? Authorizing Provider  albuterol (VENTOLIN HFA) 108 (90 Base) MCG/ACT inhaler Inhale into the lungs every 6 (six) hours as needed for wheezing or shortness of breath.   Yes [provider]  Alogliptin Benzoate 25 MG TABS Take 25 mg by mouth daily.    Yes [provider]  amLODipine (NORVASC) 10 MG tablet Take 10 mg by mouth daily.    Yes [provider]  aspirin 81 MG tablet Take 81 mg by mouth daily.   Yes [provider]  buPROPion (WELLBUTRIN XL) 150 MG 24 hr tablet Take 150 mg by mouth daily.   Yes [provider]  carvedilol (COREG) 12.5 MG tablet Take 12.5 mg by mouth 2 (two) times daily with a meal.    Yes [provider]  diclofenac sodium (VOLTAREN) 1 % GEL Apply 2 g topically every 6 (six) hours.   Yes [provider]  DULoxetine (CYMBALTA) 60 MG capsule Take 60 mg  by mouth daily.    Yes [provider]  hydroxychloroquine (PLAQUENIL) 200 MG tablet Take 400 mg by mouth daily.    Yes [provider]  ibuprofen (ADVIL,MOTRIN) 600 MG tablet Take 1 tablet (600 mg total) by mouth every 6 (six) hours as needed. Patient taking differently: Take 800 mg by mouth 2 (two) times daily as needed. 05/20/16  Yes Domenick Gong, MD  losartan (COZAAR) 100 MG tablet Take 100 mg by mouth daily.  06/19/12  Yes [provider]  Melatonin 5 MG TABS Take 15 mg by mouth at bedtime.   Yes [provider]  memantine (NAMENDA) 10 MG tablet Take 10 mg by mouth daily.   Yes [provider]  metFORMIN (GLUCOPHAGE) 500 MG tablet Take 500 mg by mouth 2 (two) times daily with a meal.   Yes [provider]  omeprazole (PRILOSEC) 20 MG capsule Take 20 mg by mouth daily.   Yes [provider]  pravastatin (PRAVACHOL) 80 MG tablet Take 80 mg by mouth at bedtime.   Yes [provider]  prazosin (MINIPRESS) 1 MG capsule Take 1 mg by mouth 3 (three) times daily.   Yes [provider]  pregabalin (LYRICA) 300 MG capsule Take 300 mg by  mouth 2 (two) times daily.   Yes [provider]  tamsulosin (FLOMAX) 0.4 MG CAPS capsule Take 0.4 mg by mouth 2 (two) times a day.  10/21/12  Yes [provider]  tiotropium (SPIRIVA) 18 MCG inhalation capsule Place 1 capsule (18 mcg total) into inhaler and inhale daily. 03/06/17  Yes Shane Crutch, MD  Tiotropium Bromide Monohydrate (SPIRIVA RESPIMAT) 1.25 MCG/ACT AERS Inhale into the lungs.   Yes [provider]  insulin glargine (LANTUS) 100 UNIT/ML injection Inject 40 Units into the skin daily.  Patient not taking: Reported on 04/20/2020 11/06/17 11/25/18  [provider]  sildenafil (REVATIO) 20 MG tablet Take 20 mg by mouth as needed. Patient not taking: Reported on 04/14/2020    [provider]    Allergies as of 03/30/2020 -  Review Complete 11/25/2018  Allergen Reaction Noted  . Banana Shortness Of Breath 01/21/2015  . Codeine Itching 06/18/2015  . Iodine Swelling 01/21/2015  . Peanut oil Shortness Of Breath 06/27/2017  . Ace inhibitors Cough 06/18/2015  . Gabapentin Other (See Comments) 06/18/2015  . Iodinated diagnostic agents Other (See Comments) 08/09/2012  . Metformin Diarrhea 06/18/2015    Family History  Problem Relation Age of Onset  . Stroke Mother   . Diabetes Father     Social History   Socioeconomic History  . Marital status: Married    Spouse name: [name]   . Number of children: 2  . Years of education: Not on file  . Highest education level: Not on file  Occupational History  . Occupation: Surveyor, minerals     Comment: retired in 2006  Tobacco Use  . Smoking status: Former Smoker    Packs/day: 0.25    Years: 30.00    Pack years: 7.50    Types: Cigarettes    Start date: 05/08/1968    Quit date: 06/17/1998    Years since quitting: 21.8  . Smokeless tobacco: Never Used  Vaping Use  . Vaping Use: Never used  Substance and Sexual Activity  . Alcohol use: No    Alcohol/week: 0.0 standard drinks  . Drug use: No  . Sexual activity: Yes    Partners: Female  Other Topics Concern  . Not on file  Social History Narrative   Married , 50 th anniversary was in 2020   Two grown children a son and a daughter    Social Determinants of Corporate investment banker Strain: Not on file  Food Insecurity: Not on file  Transportation Needs: Not on file  Physical Activity: Not on file  Stress: Not on file  Social Connections: Not on file  Intimate Partner Violence: Not on file    Review of Systems: See HPI, otherwise negative ROS  Physical Exam: BP (!) 160/83   Pulse 90   Temp (!) 97.1 F (36.2 C) (Temporal)   Resp 16   Ht 5\' 7"  (1.702 m)   Wt 97.5 kg   SpO2 95%   BMI 33.67 kg/m  General:   Alert,  pleasant and cooperative in NAD Head:  Normocephalic and  atraumatic. Respiratory:  Normal work of breathing. Heart:  Regular rate and rhythm.   Impression/Plan: is here for cataract surgery.  Risks, benefits, limitations, and alternatives regarding cataract surgery have been reviewed with the patient.  Questions have been answered.  All parties agreeable.   Ian Hazel, MD  04/20/2020, 7:18 AM

## 2020-04-20 NOTE — Op Note (Signed)
PREOPERATIVE DIAGNOSIS:  Nuclear sclerotic cataract of the left eye.   POSTOPERATIVE DIAGNOSIS:  Nuclear sclerotic cataract of the left eye.   OPERATIVE PROCEDURE:@   SURGEON:  Galen Manila, MD.   ANESTHESIA:  Anesthesiologist: Aldine Contes, MD CRNA: Maree Krabbe, CRNA  1.      Managed anesthesia care. 2.     0.51ml of Shugarcaine was instilled following the paracentesis   COMPLICATIONS:  None.   TECHNIQUE:   Stop and chop   DESCRIPTION OF PROCEDURE:  The patient was examined and consented in the preoperative holding area where the aforementioned topical anesthesia was applied to the left eye and then brought back to the Operating Room where the left eye was prepped and draped in the usual sterile ophthalmic fashion and a lid speculum was placed. A paracentesis was created with the side port blade and the anterior chamber was filled with viscoelastic. A near clear corneal incision was performed with the steel keratome. A continuous curvilinear capsulorrhexis was performed with a cystotome followed by the capsulorrhexis forceps. Hydrodissection and hydrodelineation were carried out with BSS on a blunt cannula. The lens was removed in a stop and chop  technique and the remaining cortical material was removed with the irrigation-aspiration handpiece. The capsular bag was inflated with viscoelastic and the Technis [id number] lens was placed in the capsular bag without complication. The remaining viscoelastic was removed from the eye with the irrigation-aspiration handpiece. The wounds were hydrated. The anterior chamber was flushed with BSS and the eye was inflated to physiologic pressure. 0.35ml Vigamox was placed in the anterior chamber. The wounds were found to be water tight. The eye was dressed with Combigan. The patient was given protective glasses to wear throughout the day and a shield with which to sleep tonight. The patient was also given drops with which to begin a drop regimen  today and will follow-up with me in one day. Implant Name Type Inv. Item Serial No. Manufacturer Lot No. LRB No. Used Action  LENS IOL TECNIS EYHANCE 21.5 - [id number] Intraocular Lens LENS IOL TECNIS EYHANCE 21.5 [id number] JOHNSON   Left 1 Implanted    Procedure(s): CATARACT EXTRACTION PHACO AND INTRAOCULAR LENS PLACEMENT (IOC) LEFT DIABETIC 4.37 00:48.7 (Left)  Electronically signed: Galen Manila 04/20/2020 7:56 AM

## 2020-04-20 NOTE — Anesthesia Preprocedure Evaluation (Signed)
Anesthesia Evaluation  Patient identified by MRN, date of birth, ID band Patient awake    Reviewed: Allergy & Precautions, H&P , NPO status , Patient's Chart, lab work & pertinent test results, reviewed documented beta blocker date and time   History of Anesthesia Complications (+) Emergence Delirium and history of anesthetic complications  Airway Mallampati: II  TM Distance: >3 FB Neck ROM: full    Dental no notable dental hx.    Pulmonary sleep apnea and Continuous Positive Airway Pressure Ventilation , former smoker,    Pulmonary exam normal breath sounds clear to auscultation       Cardiovascular Exercise Tolerance: Good hypertension, negative cardio ROS   Rhythm:regular Rate:Normal     Neuro/Psych PSYCHIATRIC DISORDERS Anxiety negative neurological ROS     GI/Hepatic negative GI ROS, Neg liver ROS,   Endo/Other  negative endocrine ROSdiabetes  Renal/GU negative Renal ROS  negative genitourinary   Musculoskeletal   Abdominal   Peds  Hematology negative hematology ROS (+)   Anesthesia Other Findings   Reproductive/Obstetrics negative OB ROS                             Anesthesia Physical Anesthesia Plan  ASA: II  Anesthesia Plan: MAC   Post-op Pain Management:    Induction:   PONV Risk Score and Plan: 1 and Midazolam and Treatment may vary due to age or medical condition  Airway Management Planned:   Additional Equipment:   Intra-op Plan:   Post-operative Plan:   Informed Consent: I have reviewed the patients History and Physical, chart, labs and discussed the procedure including the risks, benefits and alternatives for the proposed anesthesia with the patient or authorized representative who has indicated his/her understanding and acceptance.     Dental Advisory Given  Plan Discussed with: CRNA  Anesthesia Plan Comments:         Anesthesia Quick  Evaluation

## 2020-04-21 ENCOUNTER — Encounter: Payer: Self-pay | Admitting: Ophthalmology

## 2020-05-03 ENCOUNTER — Encounter: Payer: Self-pay | Admitting: Ophthalmology

## 2020-05-05 DIAGNOSIS — E1159 Type 2 diabetes mellitus with other circulatory complications: Secondary | ICD-10-CM | POA: Diagnosis not present

## 2020-05-05 DIAGNOSIS — H2511 Age-related nuclear cataract, right eye: Secondary | ICD-10-CM | POA: Diagnosis not present

## 2020-05-14 ENCOUNTER — Other Ambulatory Visit: Payer: Medicare Other

## 2020-05-14 ENCOUNTER — Other Ambulatory Visit
Admission: RE | Admit: 2020-05-14 | Discharge: 2020-05-14 | Disposition: A | Discharge status: Home / Self Care | Payer: Medicare Other | Source: Ambulatory Visit | Attending: Ophthalmology | Admitting: Ophthalmology

## 2020-05-14 ENCOUNTER — Other Ambulatory Visit: Payer: Self-pay

## 2020-05-14 DIAGNOSIS — Z01812 Encounter for preprocedural laboratory examination: Secondary | ICD-10-CM | POA: Diagnosis not present

## 2020-05-14 DIAGNOSIS — Z20822 Contact with and (suspected) exposure to covid-19: Secondary | ICD-10-CM | POA: Diagnosis not present

## 2020-05-14 LAB — SARS CORONAVIRUS 2 (TAT 6-24 HRS): SARS Coronavirus 2: NEGATIVE

## 2020-05-14 NOTE — Discharge Instructions (Signed)

## 2020-05-18 ENCOUNTER — Encounter: Payer: Self-pay | Admitting: Ophthalmology

## 2020-05-18 ENCOUNTER — Ambulatory Visit: Payer: Medicare Other | Admitting: Anesthesiology

## 2020-05-18 ENCOUNTER — Other Ambulatory Visit: Payer: Self-pay

## 2020-05-18 ENCOUNTER — Ambulatory Visit
Admission: RE | Admit: 2020-05-18 | Discharge: 2020-05-18 | Disposition: A | Discharge status: Home / Self Care | Payer: Medicare Other | Attending: Ophthalmology | Admitting: Ophthalmology

## 2020-05-18 ENCOUNTER — Encounter
Admission: RE | Disposition: A | Discharge status: Home / Self Care | Payer: Self-pay | Source: Home / Self Care | Attending: Ophthalmology

## 2020-05-18 DIAGNOSIS — Z87442 Personal history of urinary calculi: Secondary | ICD-10-CM | POA: Insufficient documentation

## 2020-05-18 DIAGNOSIS — Z91041 Radiographic dye allergy status: Secondary | ICD-10-CM | POA: Insufficient documentation

## 2020-05-18 DIAGNOSIS — H2511 Age-related nuclear cataract, right eye: Secondary | ICD-10-CM | POA: Insufficient documentation

## 2020-05-18 DIAGNOSIS — Z888 Allergy status to other drugs, medicaments and biological substances status: Secondary | ICD-10-CM | POA: Diagnosis not present

## 2020-05-18 DIAGNOSIS — Z794 Long term (current) use of insulin: Secondary | ICD-10-CM | POA: Insufficient documentation

## 2020-05-18 DIAGNOSIS — E1136 Type 2 diabetes mellitus with diabetic cataract: Secondary | ICD-10-CM | POA: Insufficient documentation

## 2020-05-18 DIAGNOSIS — Z9049 Acquired absence of other specified parts of digestive tract: Secondary | ICD-10-CM | POA: Insufficient documentation

## 2020-05-18 DIAGNOSIS — Z87891 Personal history of nicotine dependence: Secondary | ICD-10-CM | POA: Diagnosis not present

## 2020-05-18 DIAGNOSIS — Z79899 Other long term (current) drug therapy: Secondary | ICD-10-CM | POA: Insufficient documentation

## 2020-05-18 DIAGNOSIS — Z7982 Long term (current) use of aspirin: Secondary | ICD-10-CM | POA: Insufficient documentation

## 2020-05-18 DIAGNOSIS — Z833 Family history of diabetes mellitus: Secondary | ICD-10-CM | POA: Insufficient documentation

## 2020-05-18 DIAGNOSIS — Z885 Allergy status to narcotic agent status: Secondary | ICD-10-CM | POA: Insufficient documentation

## 2020-05-18 DIAGNOSIS — H25811 Combined forms of age-related cataract, right eye: Secondary | ICD-10-CM | POA: Diagnosis not present

## 2020-05-18 HISTORY — PX: CATARACT EXTRACTION W/PHACO: SHX586

## 2020-05-18 LAB — GLUCOSE, CAPILLARY
Glucose-Capillary: 128 mg/dL — ABNORMAL HIGH (ref 70–99)
Glucose-Capillary: 120 mg/dL — ABNORMAL HIGH (ref 70–99)

## 2020-05-18 SURGERY — PHACOEMULSIFICATION, CATARACT, WITH IOL INSERTION
Anesthesia: Monitor Anesthesia Care | Site: Eye | Laterality: Right

## 2020-05-18 MED ORDER — EPINEPHRINE PF 1 MG/ML IJ SOLN
INTRAOCULAR | Status: DC | PRN
  Administered 2020-05-18: 63 mL via OPHTHALMIC

## 2020-05-18 MED ORDER — LIDOCAINE HCL (PF) 2 % IJ SOLN
INTRAOCULAR | Status: DC | PRN
  Administered 2020-05-18: 1 mL

## 2020-05-18 MED ORDER — ARMC OPHTHALMIC DILATING DROPS
1.0000 "application " | OPHTHALMIC | Status: DC | PRN
  Administered 2020-05-18 (×3): 1 via OPHTHALMIC

## 2020-05-18 MED ORDER — FENTANYL CITRATE (PF) 100 MCG/2ML IJ SOLN
INTRAMUSCULAR | Status: DC | PRN
  Administered 2020-05-18 (×2): 50 ug via INTRAVENOUS

## 2020-05-18 MED ORDER — TETRACAINE HCL 0.5 % OP SOLN
1.0000 [drp] | OPHTHALMIC | Status: DC | PRN
  Administered 2020-05-18 (×3): 1 [drp] via OPHTHALMIC

## 2020-05-18 MED ORDER — NA CHONDROIT SULF-NA HYALURON 40-17 MG/ML IO SOLN
INTRAOCULAR | Status: DC | PRN
  Administered 2020-05-18: 1 mL via INTRAOCULAR

## 2020-05-18 MED ORDER — BRIMONIDINE TARTRATE-TIMOLOL 0.2-0.5 % OP SOLN
OPHTHALMIC | Status: DC | PRN
  Administered 2020-05-18: 1 [drp] via OPHTHALMIC

## 2020-05-18 MED ORDER — LACTATED RINGERS IV SOLN: INTRAVENOUS | Status: DC

## 2020-05-18 MED ORDER — MOXIFLOXACIN HCL 0.5 % OP SOLN
OPHTHALMIC | Status: DC | PRN
  Administered 2020-05-18: 0.2 mL via OPHTHALMIC

## 2020-05-18 MED ORDER — MIDAZOLAM HCL 2 MG/2ML IJ SOLN
INTRAMUSCULAR | Status: DC | PRN
  Administered 2020-05-18: 2 mg via INTRAVENOUS

## 2020-05-18 SURGICAL SUPPLY — 21 items
CANNULA ANT/CHMB 27G (MISCELLANEOUS) ×2 IMPLANT
CANNULA ANT/CHMB 27GA (MISCELLANEOUS) ×4 IMPLANT
GLOVE SURG LX 8.0 MICRO (GLOVE) ×2
GLOVE SURG LX STRL 8.0 MICRO (GLOVE) ×1 IMPLANT
GLOVE SURG TRIUMPH 8.0 PF LTX (GLOVE) ×2 IMPLANT
GOWN STRL REUS W/ TWL LRG LVL3 (GOWN DISPOSABLE) ×2 IMPLANT
GOWN STRL REUS W/TWL LRG LVL3 (GOWN DISPOSABLE) ×4
LENS IOL TECNIS EYHANCE 22.0 (Intraocular Lens) ×1 IMPLANT
MARKER SKIN DUAL TIP RULER LAB (MISCELLANEOUS) ×2 IMPLANT
NDL FILTER BLUNT 18X1 1/2 (NEEDLE) ×1 IMPLANT
NEEDLE FILTER BLUNT 18X 1/2SAF (NEEDLE) ×1
NEEDLE FILTER BLUNT 18X1 1/2 (NEEDLE) ×1 IMPLANT
PACK EYE AFTER SURG (MISCELLANEOUS) ×2 IMPLANT
PACK OPTHALMIC (MISCELLANEOUS) ×2 IMPLANT
PACK PORFILIO (MISCELLANEOUS) ×2 IMPLANT
SUT ETHILON 10-0 CS-B-6CS-B-6 (SUTURE)
SUTURE EHLN 10-0 CS-B-6CS-B-6 (SUTURE) IMPLANT
SYR 3ML LL SCALE MARK (SYRINGE) ×2 IMPLANT
SYR TB 1ML LUER SLIP (SYRINGE) ×2 IMPLANT
WATER STERILE IRR 250ML POUR (IV SOLUTION) ×2 IMPLANT
WIPE NON LINTING 3.25X3.25 (MISCELLANEOUS) ×2 IMPLANT

## 2020-05-18 NOTE — Anesthesia Preprocedure Evaluation (Signed)
Anesthesia Evaluation  Patient identified by MRN, date of birth, ID band Patient awake    Reviewed: Allergy & Precautions, H&P , NPO status , Patient's Chart, lab work & pertinent test results, reviewed documented beta blocker date and time   History of Anesthesia Complications (+) Emergence Delirium and history of anesthetic complications  Airway Mallampati: II  TM Distance: >3 FB Neck ROM: full    Dental no notable dental hx.    Pulmonary sleep apnea and Continuous Positive Airway Pressure Ventilation , former smoker,    Pulmonary exam normal        Cardiovascular hypertension, Normal cardiovascular exam     Neuro/Psych PSYCHIATRIC DISORDERS Anxiety negative neurological ROS     GI/Hepatic negative GI ROS, Neg liver ROS,   Endo/Other  diabetes, Type 2  Renal/GU negative Renal ROS  negative genitourinary   Musculoskeletal  (+) Arthritis , Osteoarthritis,    Abdominal (+) + obese,   Peds  Hematology negative hematology ROS (+)   Anesthesia Other Findings   Reproductive/Obstetrics negative OB ROS                             Anesthesia Physical  Anesthesia Plan  ASA: III  Anesthesia Plan: MAC   Post-op Pain Management:    Induction:   PONV Risk Score and Plan: 1 and Midazolam and Treatment may vary due to age or medical condition  Airway Management Planned: Natural Airway and Nasal Cannula  Additional Equipment: None  Intra-op Plan:   Post-operative Plan:   Informed Consent: I have reviewed the patients History and Physical, chart, labs and discussed the procedure including the risks, benefits and alternatives for the proposed anesthesia with the patient or authorized representative who has indicated his/her understanding and acceptance.     Dental Advisory Given  Plan Discussed with: CRNA  Anesthesia Plan Comments:         Anesthesia Quick Evaluation

## 2020-05-18 NOTE — H&P (Signed)
[facility]   Primary Care Physician:  Alba Cory, MD Ophthalmologist: Dr. Druscilla Brownie  Pre-Procedure History & Physical: HPI:  Ian Lucas is a 70 y.o. male here for cataract surgery.   Past Medical History:  Diagnosis Date  . Arthritis    Rheumatoid  . Complication of anesthesia    has become combative following surgery  . Diabetes mellitus without complication (HCC)    Type 2  . History of kidney stones    x 2  . History of shingles   . Hypercholesteremia   . Hypertension   . Neuropathy   . Pancreatitis   . PTSD (post-traumatic stress disorder)   . Sleep apnea    CPAP machine  . Vertigo    occasional    Past Surgical History:  Procedure Laterality Date  . BACK SURGERY     x 2  . CATARACT EXTRACTION W/PHACO Left 04/20/2020   Procedure: CATARACT EXTRACTION PHACO AND INTRAOCULAR LENS PLACEMENT (IOC) LEFT DIABETIC 4.37 00:48.7;  Surgeon: Galen Manila, MD;  Location: [facility];  Service: Ophthalmology;  Laterality: Left;  . CHOLECYSTECTOMY N/A 11/15/2018   Procedure: LAPAROSCOPIC CHOLECYSTECTOMY;  Surgeon: Leafy Ro, MD;  Location: [facility];  Service: General;  Laterality: N/A;  . CIRCUMCISION N/A   . COLONOSCOPY    . HERNIA REPAIR     Hiatal  . HYDROCELE EXCISION / REPAIR Left     Prior to Admission medications   Medication Sig Start Date End Date Taking? Authorizing Provider  albuterol (VENTOLIN HFA) 108 (90 Base) MCG/ACT inhaler Inhale into the lungs every 6 (six) hours as needed for wheezing or shortness of breath.   Yes [provider]  Alogliptin Benzoate 25 MG TABS Take 25 mg by mouth daily.    Yes [provider]  amLODipine (NORVASC) 10 MG tablet Take 10 mg by mouth daily.    Yes [provider]  aspirin 81 MG tablet Take 81 mg by mouth daily.   Yes [provider]  buPROPion (WELLBUTRIN XL) 150 MG 24 hr tablet Take 150 mg by mouth daily.   Yes [provider]  carvedilol (COREG)  12.5 MG tablet Take 12.5 mg by mouth 2 (two) times daily with a meal.    Yes [provider]  diclofenac sodium (VOLTAREN) 1 % GEL Apply 2 g topically every 6 (six) hours.   Yes [provider]  DULoxetine (CYMBALTA) 60 MG capsule Take 60 mg by mouth daily.    Yes [provider]  hydroxychloroquine (PLAQUENIL) 200 MG tablet Take 400 mg by mouth daily.    Yes [provider]  ibuprofen (ADVIL,MOTRIN) 600 MG tablet Take 1 tablet (600 mg total) by mouth every 6 (six) hours as needed. Patient taking differently: Take 800 mg by mouth 2 (two) times daily as needed. 05/20/16  Yes Domenick Gong, MD  losartan (COZAAR) 100 MG tablet Take 100 mg by mouth daily.  06/19/12  Yes [provider]  Melatonin 5 MG TABS Take 15 mg by mouth at bedtime.   Yes [provider]  memantine (NAMENDA) 10 MG tablet Take 10 mg by mouth daily.   Yes [provider]  metFORMIN (GLUCOPHAGE) 500 MG tablet Take 500 mg by mouth 2 (two) times daily with a meal.   Yes [provider]  omeprazole (PRILOSEC) 20 MG capsule Take 20 mg by mouth daily.   Yes [provider]  pravastatin (PRAVACHOL) 80 MG tablet Take 80 mg by mouth at  bedtime.   Yes [provider]  prazosin (MINIPRESS) 1 MG capsule Take 1 mg by mouth 3 (three) times daily.   Yes [provider]  pregabalin (LYRICA) 300 MG capsule Take 300 mg by mouth 2 (two) times daily.   Yes [provider]  tamsulosin (FLOMAX) 0.4 MG CAPS capsule Take 0.4 mg by mouth 2 (two) times a day.  10/21/12  Yes [provider]  tiotropium (SPIRIVA) 18 MCG inhalation capsule Place 1 capsule (18 mcg total) into inhaler and inhale daily. 03/06/17  Yes Shane Crutch, MD  Tiotropium Bromide Monohydrate (SPIRIVA RESPIMAT) 1.25 MCG/ACT AERS Inhale into the lungs.   Yes [provider]  insulin glargine (LANTUS) 100 UNIT/ML injection Inject 40 Units into the skin daily.   Patient not taking: Reported on 04/20/2020 11/06/17 11/25/18  [provider]  sildenafil (REVATIO) 20 MG tablet Take 20 mg by mouth as needed. Patient not taking: Reported on 04/14/2020    [provider]    Allergies as of 03/30/2020 - Review Complete 11/25/2018  Allergen Reaction Noted  . Banana Shortness Of Breath 01/21/2015  . Codeine Itching 06/18/2015  . Iodine Swelling 01/21/2015  . Peanut oil Shortness Of Breath 06/27/2017  . Ace inhibitors Cough 06/18/2015  . Gabapentin Other (See Comments) 06/18/2015  . Iodinated diagnostic agents Other (See Comments) 08/09/2012  . Metformin Diarrhea 06/18/2015    Family History  Problem Relation Age of Onset  . Stroke Mother   . Diabetes Father     Social History   Socioeconomic History  . Marital status: Married    Spouse name: [name]   . Number of children: 2  . Years of education: Not on file  . Highest education level: Not on file  Occupational History  . Occupation: Surveyor, minerals     Comment: retired in 2006  Tobacco Use  . Smoking status: Former Smoker    Packs/day: 0.25    Years: 30.00    Pack years: 7.50    Types: Cigarettes    Start date: 05/08/1968    Quit date: 06/17/1998    Years since quitting: 21.9  . Smokeless tobacco: Never Used  Vaping Use  . Vaping Use: Never used  Substance and Sexual Activity  . Alcohol use: No    Alcohol/week: 0.0 standard drinks  . Drug use: No  . Sexual activity: Yes    Partners: Female  Other Topics Concern  . Not on file  Social History Narrative   Married , 50 th anniversary was in 2020   Two grown children a son and a daughter    Social Determinants of Corporate investment banker Strain: Not on file  Food Insecurity: Not on file  Transportation Needs: Not on file  Physical Activity: Not on file  Stress: Not on file  Social Connections: Not on file  Intimate Partner Violence: Not on file    Review of Systems: See HPI, otherwise negative  ROS  Physical Exam: BP (!) 168/83   Pulse 68   Temp (!) 97.2 F (36.2 C) (Temporal)   Wt 100.7 kg   SpO2 96%   BMI 34.77 kg/m  General:   Alert,  pleasant and cooperative in NAD Head:  Normocephalic and atraumatic. Respiratory:  Normal work of breathing.  Impression/Plan: Sigmund Hazel is here for cataract surgery.  Risks, benefits, limitations, and alternatives regarding cataract surgery have been reviewed with the patient.  Questions have been answered.  All parties agreeable.   Chrissie Noa  Xee Hollman, MD  05/18/2020, 7:21 AM

## 2020-05-18 NOTE — Anesthesia Procedure Notes (Signed)
Procedure Name: MAC Date/Time: 05/18/2020 7:34 AM Performed by: Jeannene Patella, CRNA Pre-anesthesia Checklist: Patient identified, Emergency Drugs available, Suction available, Timeout performed and Patient being monitored Patient Re-evaluated:Patient Re-evaluated prior to induction Oxygen Delivery Method: Nasal cannula Placement Confirmation: positive ETCO2

## 2020-05-18 NOTE — Anesthesia Postprocedure Evaluation (Signed)
Anesthesia Post Note  Patient: Ian Lucas  Procedure(s) Performed: CATARACT EXTRACTION PHACO AND INTRAOCULAR LENS PLACEMENT (IOC) RIGHT DIABETIC (Right Eye)     Patient location during evaluation: PACU Anesthesia Type: MAC Level of consciousness: awake and alert Pain management: pain level controlled Vital Signs Assessment: post-procedure vital signs reviewed and stable Respiratory status: spontaneous breathing and nonlabored ventilation Cardiovascular status: blood pressure returned to baseline Postop Assessment: no apparent nausea or vomiting Anesthetic complications: no   No complications documented.  Matthews Franks [employer]

## 2020-05-18 NOTE — Op Note (Signed)
PREOPERATIVE DIAGNOSIS:  Nuclear sclerotic cataract of the right eye.   POSTOPERATIVE DIAGNOSIS:  Cataract   OPERATIVE PROCEDURE:@   SURGEON:  Galen Manila, MD.   ANESTHESIA:  Anesthesiologist: Fletcher Anon, MD CRNA: Jinny Blossom, CRNA  1.      Managed anesthesia care. 2.      0.15ml of Shugarcaine was instilled in the eye following the paracentesis.   COMPLICATIONS:  None.   TECHNIQUE:   Stop and chop   DESCRIPTION OF PROCEDURE:  The patient was examined and consented in the preoperative holding area where the aforementioned topical anesthesia was applied to the right eye and then brought back to the Operating Room where the right eye was prepped and draped in the usual sterile ophthalmic fashion and a lid speculum was placed. A paracentesis was created with the side port blade and the anterior chamber was filled with viscoelastic. A near clear corneal incision was performed with the steel keratome. A continuous curvilinear capsulorrhexis was performed with a cystotome followed by the capsulorrhexis forceps. Hydrodissection and hydrodelineation were carried out with BSS on a blunt cannula. The lens was removed in a stop and chop  technique and the remaining cortical material was removed with the irrigation-aspiration handpiece. The capsular bag was inflated with viscoelastic and the Technis [id number]  lens was placed in the capsular bag without complication. The remaining viscoelastic was removed from the eye with the irrigation-aspiration handpiece. The wounds were hydrated. The anterior chamber was flushed with BSS and the eye was inflated to physiologic pressure. 0.34ml of Vigamox was placed in the anterior chamber. The wounds were found to be water tight. The eye was dressed with Combigan. The patient was given protective glasses to wear throughout the day and a shield with which to sleep tonight. The patient was also given drops with which to begin a drop regimen today and will  follow-up with me in one day. Implant Name Type Inv. Item Serial No. Manufacturer Lot No. LRB No. Used Action  LENS IOL TECNIS EYHANCE 22.0 - [id number] Intraocular Lens LENS IOL TECNIS EYHANCE 22.0 [id number] JOHNSON   Right 1 Implanted   Procedure(s) with comments: CATARACT EXTRACTION PHACO AND INTRAOCULAR LENS PLACEMENT (IOC) RIGHT DIABETIC (Right) - 4.15 0:35.6  Electronically signed: Galen Manila 05/18/2020 7:50 AM

## 2020-05-18 NOTE — Transfer of Care (Signed)
Immediate Anesthesia Transfer of Care Note  Patient: Ian Lucas  Procedure(s) Performed: CATARACT EXTRACTION PHACO AND INTRAOCULAR LENS PLACEMENT (IOC) RIGHT DIABETIC (Right Eye)  Patient Location: PACU  Anesthesia Type: MAC  Level of Consciousness: awake, alert  and patient cooperative  Airway and Oxygen Therapy: Patient Spontanous Breathing and Patient connected to supplemental oxygen  Post-op Assessment: Post-op Vital signs reviewed, Patient's Cardiovascular Status Stable, Respiratory Function Stable, Patent Airway and No signs of Nausea or vomiting  Post-op Vital Signs: Reviewed and stable  Complications: No complications documented.

## 2020-05-19 ENCOUNTER — Encounter: Payer: Self-pay | Admitting: Ophthalmology

## 2020-07-02 DIAGNOSIS — E11311 Type 2 diabetes mellitus with unspecified diabetic retinopathy with macular edema: Secondary | ICD-10-CM | POA: Diagnosis not present

## 2020-08-12 DIAGNOSIS — E113311 Type 2 diabetes mellitus with moderate nonproliferative diabetic retinopathy with macular edema, right eye: Secondary | ICD-10-CM | POA: Diagnosis not present

## 2020-08-19 ENCOUNTER — Ambulatory Visit
Admission: EM | Admit: 2020-08-19 | Discharge: 2020-08-19 | Disposition: A | Discharge status: Home / Self Care | Payer: No Typology Code available for payment source | Attending: Emergency Medicine | Admitting: Emergency Medicine

## 2020-08-19 ENCOUNTER — Ambulatory Visit (INDEPENDENT_AMBULATORY_CARE_PROVIDER_SITE_OTHER): Payer: No Typology Code available for payment source

## 2020-08-19 ENCOUNTER — Other Ambulatory Visit: Payer: Self-pay

## 2020-08-19 DIAGNOSIS — R059 Cough, unspecified: Secondary | ICD-10-CM

## 2020-08-19 DIAGNOSIS — R0981 Nasal congestion: Secondary | ICD-10-CM | POA: Diagnosis not present

## 2020-08-19 DIAGNOSIS — R0602 Shortness of breath: Secondary | ICD-10-CM | POA: Diagnosis not present

## 2020-08-19 MED ORDER — PREDNISONE 10 MG (21) PO TBPK: ORAL_TABLET | Freq: Every day | ORAL | 0 refills | Status: DC

## 2020-08-19 MED ORDER — BENZONATATE 100 MG PO CAPS: 200.0000 mg | ORAL_CAPSULE | Freq: Three times a day (TID) | ORAL | 0 refills | Status: DC

## 2020-08-19 MED ORDER — AEROCHAMBER MV MISC: 2 refills | Status: AC

## 2020-08-19 NOTE — ED Provider Notes (Signed)
[facility]    CSN: [id number] Arrival date & time: 08/19/20  1017      History   Chief Complaint Chief Complaint  Patient presents with  . Cough    HPI Ian Lucas is a 70 y.o. male.   HPI   70 year old male here for evaluation of cough and congestion.  Patient ports that he has been experiencing cough and congestion for the past 2 days and that his cough increases when he lays down at night.  Patient reports that his cough is nonproductive but he is experiencing wheezing.  Patient denies fever, shortness of breath, runny nose, or nasal congestion.  Patient is prescribed albuterol and Spiriva when she has been using but states that it has not been helping his cough.  Patient denies any history of COPD.  Past Medical History:  Diagnosis Date  . Arthritis    Rheumatoid  . Complication of anesthesia    has become combative following surgery  . Diabetes mellitus without complication (HCC)    Type 2  . History of kidney stones    x 2  . History of shingles   . Hypercholesteremia   . Hypertension   . Neuropathy   . Pancreatitis   . PTSD (post-traumatic stress disorder)   . Sleep apnea    CPAP machine  . Vertigo    occasional    Patient Active Problem List   Diagnosis Date Noted  . Elevated lipase 11/15/2018  . Generalized abdominal pain   . Chronic pain syndrome 03/08/2016  . 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 Elevated prostate specific antigen (PSA) 06/18/2015  . Benign essential HTN 06/18/2015  . Chronic midline low back pain with bilateral sciatica 06/18/2015  . Diabetic neuropathy (HCC) 06/18/2015  . Posttraumatic stress disorder 06/18/2015  . Dyslipidemia 06/18/2015  . Lichen simplex 06/18/2015  . History of herpes zoster 06/18/2015  . Restless leg 06/18/2015  . OSA (obstructive sleep apnea) 06/18/2015  . Polyneuropathy 06/18/2015  . 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 Calculus of kidney 08/21/2013  .  History of pancreatitis 08/15/2013  . Hydrocele 05/19/2013  . Arthritis of knee, degenerative 10/30/2012  . Lumbar canal stenosis 10/30/2012  . Chronic prostatitis 06/19/2012  . Hypogonadism in male 06/19/2012  . 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 Benign prostatic hyperplasia with urinary obstruction 06/19/2012    Past Surgical History:  Procedure Laterality Date  . BACK SURGERY     x 2  . CATARACT EXTRACTION W/PHACO Left 04/20/2020   Procedure: CATARACT EXTRACTION PHACO AND INTRAOCULAR LENS PLACEMENT (IOC) LEFT DIABETIC 4.37 00:48.7;  Surgeon: Galen Manila, MD;  Location: [facility];  Service: Ophthalmology;  Laterality: Left;  . CATARACT EXTRACTION W/PHACO Right 05/18/2020   Procedure: CATARACT EXTRACTION PHACO AND INTRAOCULAR LENS PLACEMENT (IOC) RIGHT DIABETIC;  Surgeon: Galen Manila, MD;  Location: [facility];  Service: Ophthalmology;  Laterality: Right;  4.15 0:35.6  . CHOLECYSTECTOMY N/A 11/15/2018   Procedure: LAPAROSCOPIC CHOLECYSTECTOMY;  Surgeon: Leafy Ro, MD;  Location: [facility];  Service: General;  Laterality: N/A;  . CIRCUMCISION N/A   . COLONOSCOPY    . HERNIA REPAIR     Hiatal  . HYDROCELE EXCISION / REPAIR Left        Home Medications    Prior to Admission medications   Medication Sig Start Date End Date Taking? Authorizing Provider  albuterol (VENTOLIN HFA) 108 (90 Base) MCG/ACT inhaler Inhale into the lungs every 6 (six)  hours as needed for wheezing or shortness of breath.   Yes [provider]  Alogliptin Benzoate 25 MG TABS Take 25 mg by mouth daily.    Yes [provider]  amLODipine (NORVASC) 10 MG tablet Take 10 mg by mouth daily.    Yes [provider]  aspirin 81 MG tablet Take 81 mg by mouth daily.   Yes [provider]  benzonatate (TESSALON) 100 MG capsule Take 2 capsules (200 mg total) by mouth every 8 (eight) hours. 08/19/20  Yes Becky Augusta, NP  buPROPion  (WELLBUTRIN XL) 150 MG 24 hr tablet Take 150 mg by mouth daily.   Yes [provider]  carvedilol (COREG) 12.5 MG tablet Take 12.5 mg by mouth 2 (two) times daily with a meal.    Yes [provider]  diclofenac sodium (VOLTAREN) 1 % GEL Apply 2 g topically every 6 (six) hours.   Yes [provider]  DULoxetine (CYMBALTA) 60 MG capsule Take 60 mg by mouth daily.    Yes [provider]  hydroxychloroquine (PLAQUENIL) 200 MG tablet Take 400 mg by mouth daily.    Yes [provider]  ibuprofen (ADVIL,MOTRIN) 600 MG tablet Take 1 tablet (600 mg total) by mouth every 6 (six) hours as needed. Patient taking differently: Take 800 mg by mouth 2 (two) times daily as needed. 05/20/16  Yes Domenick Gong, MD  losartan (COZAAR) 100 MG tablet Take 100 mg by mouth daily.  06/19/12  Yes [provider]  Melatonin 5 MG TABS Take 15 mg by mouth at bedtime.   Yes [provider]  memantine (NAMENDA) 10 MG tablet Take 10 mg by mouth daily.   Yes [provider]  metFORMIN (GLUCOPHAGE) 500 MG tablet Take 500 mg by mouth 2 (two) times daily with a meal.   Yes [provider]  omeprazole (PRILOSEC) 20 MG capsule Take 20 mg by mouth daily.   Yes [provider]  pravastatin (PRAVACHOL) 80 MG tablet Take 80 mg by mouth at bedtime.   Yes [provider]  prazosin (MINIPRESS) 1 MG capsule Take 1 mg by mouth 3 (three) times daily.   Yes [provider]  predniSONE (STERAPRED UNI-PAK 21 TAB) 10 MG (21) TBPK tablet Take by mouth daily. Take 6 tabs by mouth daily  for 2 days, then 5 tabs for 2 days, then 4 tabs for 2 days, then 3 tabs for 2 days, 2 tabs for 2 days, then 1 tab by mouth daily for 2 days 08/19/20  Yes Becky Augusta, NP  pregabalin (LYRICA) 300 MG capsule Take 300 mg by mouth 2 (two) times daily.   Yes [provider]  sildenafil (REVATIO) 20 MG tablet Take 20 mg by mouth as needed.   Yes [provider]  Spacer/Aero-Holding Chambers (AEROCHAMBER MV) inhaler Use as instructed 08/19/20  Yes Becky Augusta, NP  tamsulosin (FLOMAX) 0.4 MG CAPS capsule Take 0.4 mg by mouth 2 (two) times a day.  10/21/12  Yes [provider]  tiotropium (SPIRIVA) 18 MCG inhalation capsule Place 1 capsule (18 mcg total) into inhaler and inhale daily. 03/06/17  Yes Shane Crutch, MD  Tiotropium Bromide Monohydrate (SPIRIVA RESPIMAT) 1.25 MCG/ACT AERS Inhale into the lungs.   Yes [provider]  insulin glargine (LANTUS) 100 UNIT/ML injection Inject 40 Units into the skin daily.  Patient not taking: No sig reported 11/06/17 08/19/20  [provider]    Family History Family History  Problem Relation  Age of Onset  . Stroke Mother   . Diabetes Father     Social History Social History   Tobacco Use  . Smoking status: Former Smoker    Packs/day: 0.25    Years: 30.00    Pack years: 7.50    Types: Cigarettes    Start date: 05/08/1968    Quit date: 06/17/1998    Years since quitting: 22.1  . Smokeless tobacco: Never Used  Vaping Use  . Vaping Use: Never used  Substance Use Topics  . Alcohol use: No    Alcohol/week: 0.0 standard drinks  . Drug use: No     Allergies   Banana, Codeine, Iodine, Peanut oil, Ace inhibitors, Gabapentin, Iodinated diagnostic agents, and Metformin   Review of Systems Review of Systems  Constitutional: Negative for activity change, appetite change and fever.  HENT: Negative for congestion, ear pain, rhinorrhea and sore throat.   Respiratory: Positive for cough and wheezing. Negative for shortness of breath.   Cardiovascular: Negative for chest pain and leg swelling.  Skin: Negative.   Hematological: Negative.   Psychiatric/Behavioral: Negative.      Physical Exam Triage Vital Signs ED Triage Vitals  Enc Vitals Group     BP 08/19/20 1029 134/75     Pulse Rate 08/19/20 1029 90     Resp 08/19/20 1029 16     Temp 08/19/20 1029  98.6 F (37 C)     Temp Source 08/19/20 1029 Oral     SpO2 08/19/20 1029 97 %     Weight 08/19/20 1027 220 lb (99.8 kg)     Height 08/19/20 1027  (1.676 m)     Head Circumference --      Peak Flow --      Pain Score 08/19/20 1027 0     Pain Loc --      Pain Edu? --      Excl. in GC? --    No data found.  Updated Vital Signs BP 134/75 (BP Location: Right Arm)   Pulse 90   Temp 98.6 F (37 C) (Oral)   Resp 16   Ht  (1.676 m)   Wt 220 lb (99.8 kg)   SpO2 97%   BMI 35.51 kg/m   Visual Acuity Right Eye Distance:   Left Eye Distance:   Bilateral Distance:    Right Eye Near:   Left Eye Near:    Bilateral Near:     Physical Exam Vitals and nursing note reviewed.  Constitutional:      General: He is not in acute distress.    Appearance: Normal appearance. He is obese. He is not ill-appearing.  HENT:     Head: Normocephalic and atraumatic.     Right Ear: Tympanic membrane, ear canal and external ear normal. There is no impacted cerumen.     Left Ear: Tympanic membrane, ear canal and external ear normal. There is no impacted cerumen.     Nose: Congestion present. No rhinorrhea.     Mouth/Throat:     Mouth: Mucous membranes are moist.     Pharynx: Oropharynx is clear. No posterior oropharyngeal erythema.  Cardiovascular:     Rate and Rhythm: Normal rate and regular rhythm.     Pulses: Normal pulses.     Heart sounds: Normal heart sounds. No murmur heard. No gallop.   Pulmonary:     Effort: Pulmonary effort is normal.     Breath sounds: No wheezing, rhonchi or rales.  Skin:  General: Skin is warm and dry.     Capillary Refill: Capillary refill takes less than 2 seconds.     Findings: No erythema or rash.  Neurological:     General: No focal deficit present.     Mental Status: He is alert and oriented to person, place, and time.  Psychiatric:        Mood and Affect: Mood normal.        Behavior: Behavior normal.        Thought Content: Thought content  normal.        Judgment: Judgment normal.      UC Treatments / Results  Labs (all labs ordered are listed, but only abnormal results are displayed) Labs Reviewed - No data to display  EKG   Radiology DG Chest 2 View  Result Date: 08/19/2020 CLINICAL DATA:  Shortness of breath, cough and congestion. EXAM: CHEST - 2 VIEW COMPARISON:  03/06/2017 FINDINGS: The heart size is normal. Stable tortuosity of the thoracic aorta. Lung volumes are low bilaterally. There is no evidence of pulmonary edema, consolidation, pneumothorax, nodule or pleural fluid. Visualized bony structures are unremarkable. IMPRESSION: No active cardiopulmonary disease. Electronically Signed   By: Irish Lack M.D.   On: 08/19/2020 11:44    Procedures Procedures (including critical care time)  Medications Ordered in UC Medications - No data to display  Initial Impression / Assessment and Plan / UC Course  I have reviewed the triage vital signs and the nursing notes.  Pertinent labs & imaging results that were available during my care of the patient were reviewed by me and considered in my medical decision making (see chart for details).   Is a very pleasant 70 year old male here for evaluation of chest congestion and cough that been worsening over the last 2 days and 10 to be more prominent at night.  Patient's cough is nonproductive but he does experience wheezing.  Patient states he does not have COPD but he is on Spiriva and albuterol, both of which she has been using at home with little effect.  Patient's not had a fever, shortness of breath, or respiratory symptoms.  Physical exam reveals bilateral pearly gray tympanic membranes with a normal light reflex and clear external auditory canals.  Nasal mucosa is edematous and pale without nasal discharge.  Posterior oropharynx is unremarkable.  No cervical lymphadenopathy on exam.  Patient's heart sounds are normal but lung sounds are decreased diffusely.  Will obtain  chest x-ray to look for acute intrathoracic process.  Chest x-ray independently evaluated by me.  Interpretation: No infiltrate or effusion noted.  Awaiting radiology overread.  Interpretation is negative for acute intrathoracic process.  Will discharge patient home with a prescription for prednisone taper to help with lung inflammation and to improve his breathing.  We will also provide patient with Jerilynn Som to help with his cough and an AeroChamber to use with his inhaler.   Final Clinical Impressions(s) / UC Diagnoses   Final diagnoses:  Cough     Discharge Instructions     Continue to use your albuterol inhaler with a spacer, 2 puffs every 4-6 hours, as needed for wheezing and cough.  Starting tomorrow morning begin the prednisone taper.  You will take these with breakfast and will decrease over a period of 6 days.  Use the Tessalon Perles every 8 hours as needed for cough.  Take them with a small sip of water.  They may give you some numbness to the base  of your tongue or metallic taste in her mouth but this is normal.  Return for reevaluation for any new or worsening symptoms.    ED Prescriptions    Medication Sig Dispense Auth. Provider   Spacer/Aero-Holding Chambers (AEROCHAMBER MV) inhaler Use as instructed 1 each Becky Augusta, NP   benzonatate (TESSALON) 100 MG capsule Take 2 capsules (200 mg total) by mouth every 8 (eight) hours. 21 capsule Becky Augusta, NP   predniSONE (STERAPRED UNI-PAK 21 TAB) 10 MG (21) TBPK tablet Take by mouth daily. Take 6 tabs by mouth daily  for 2 days, then 5 tabs for 2 days, then 4 tabs for 2 days, then 3 tabs for 2 days, 2 tabs for 2 days, then 1 tab by mouth daily for 2 days 42 tablet Becky Augusta, NP     PDMP not reviewed this encounter.   Becky Augusta, NP 08/19/20 1203

## 2020-08-19 NOTE — ED Triage Notes (Signed)
Patient complains of cough with congestion x 2 days. States that he was unable to rest last night due to cough.

## 2020-08-19 NOTE — Discharge Instructions (Addendum)
Continue to use your albuterol inhaler with a spacer, 2 puffs every 4-6 hours, as needed for wheezing and cough.  Starting tomorrow morning begin the prednisone taper.  You will take these with breakfast and will decrease over a period of 6 days.  Use the Tessalon Perles every 8 hours as needed for cough.  Take them with a small sip of water.  They may give you some numbness to the base of your tongue or metallic taste in her mouth but this is normal.  Return for reevaluation for any new or worsening symptoms.

## 2020-09-15 DIAGNOSIS — E113311 Type 2 diabetes mellitus with moderate nonproliferative diabetic retinopathy with macular edema, right eye: Secondary | ICD-10-CM | POA: Diagnosis not present

## 2020-10-03 IMAGING — CT CT ABDOMEN AND PELVIS WITHOUT CONTRAST
2 of 4 series · 16 of 46 positions shown, 18 images · non-contrast
Comparison: Right upper quadrant abdomen ultrasound obtained
earlier today. Abdomen and pelvis CT dated 08/15/2013.

CLINICAL DATA: Upper abdominal pain, nausea and vomiting for 1 day.

EXAM:
CT ABDOMEN AND PELVIS WITHOUT CONTRAST
TECHNIQUE: Multidetector CT imaging of the abdomen and pelvis was performed
following the standard protocol without IV contrast.

[Series 2: routine abd/pel wo · axial · 0.77mm/px · z∈[-1091,-616]mm · 13 of 105 slices shown, 15 images]
[im 5/105  soft-tissue]
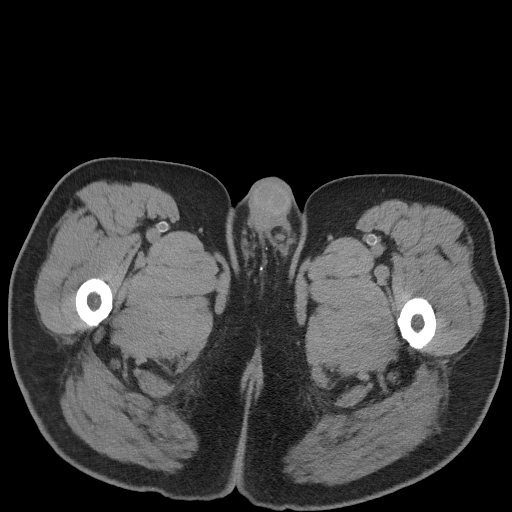
[im 5/105  bone]
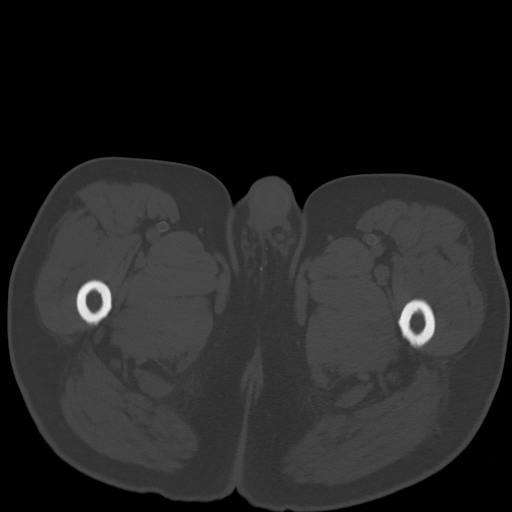
[im 14/105  soft-tissue]
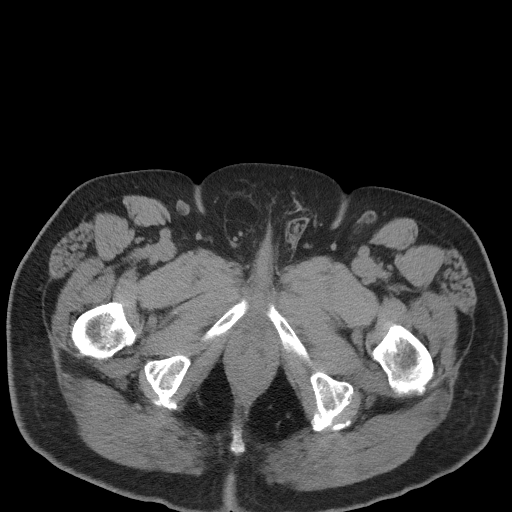
[im 22/105  soft-tissue]
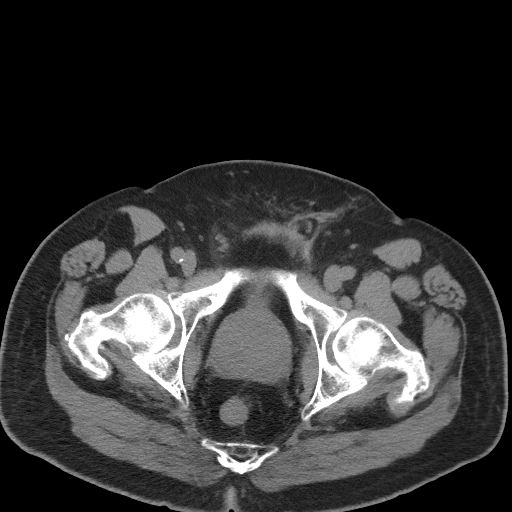
[im 31/105  soft-tissue]
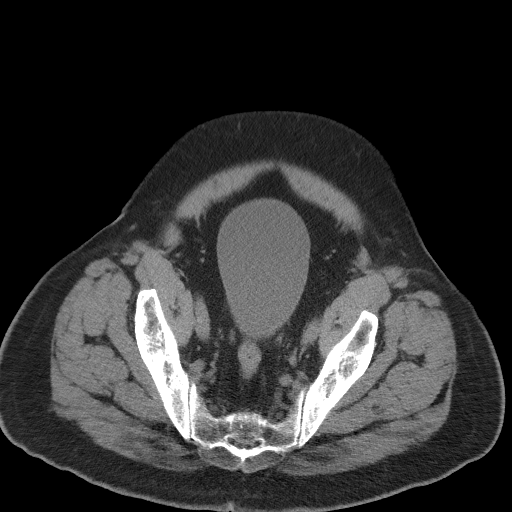
[im 35/105  soft-tissue]
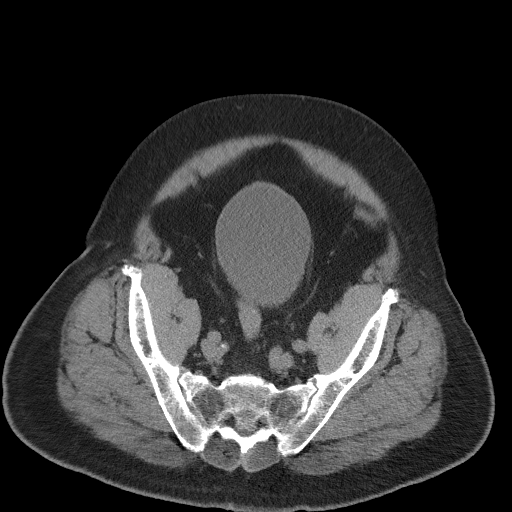
[im 44/105  soft-tissue]
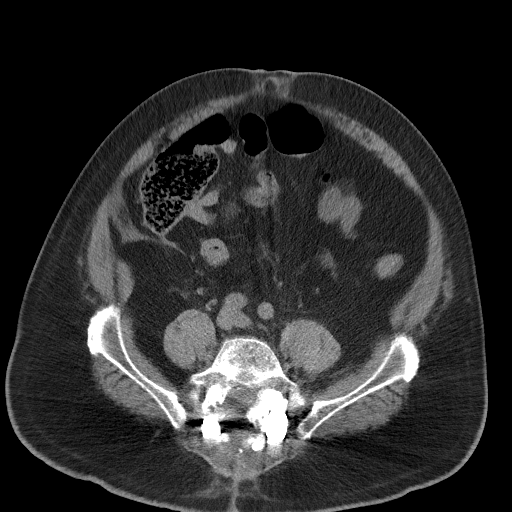
[im 53/105  soft-tissue]
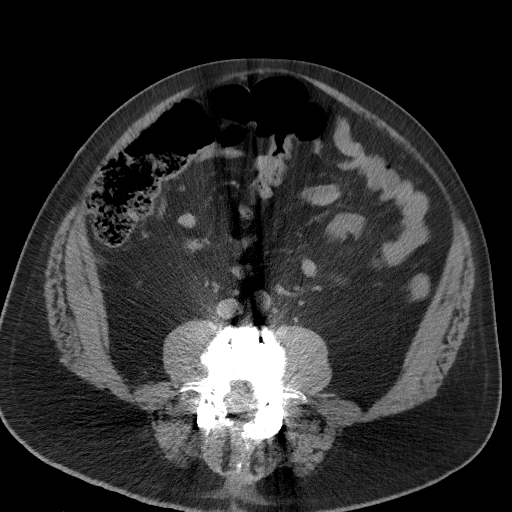
[im 61/105  soft-tissue]
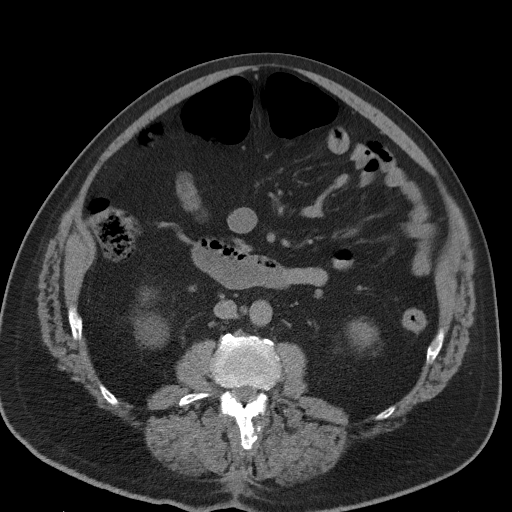
[im 70/105  soft-tissue]
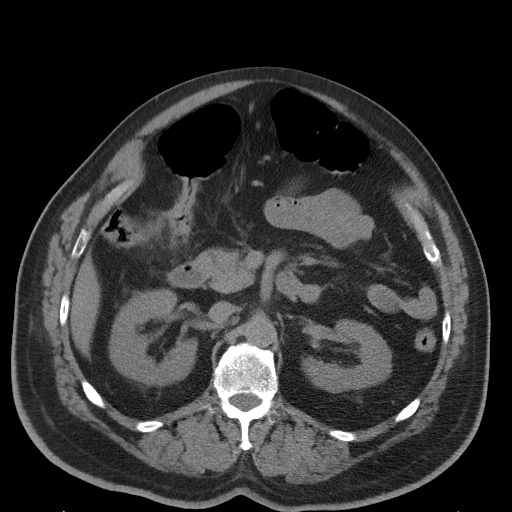
[im 70/105  bone]
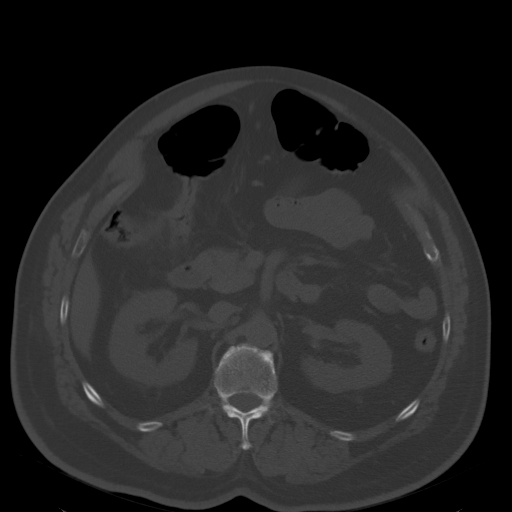
[im 74/105  soft-tissue]
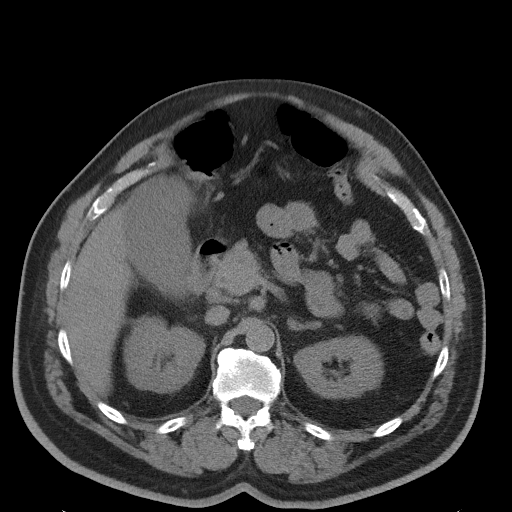
[im 83/105  soft-tissue]
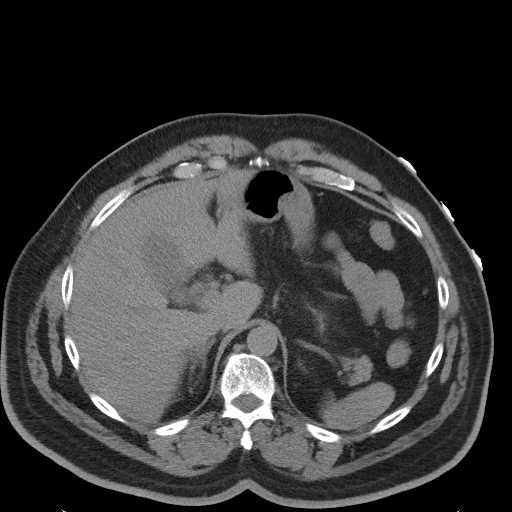
[im 92/105  soft-tissue]
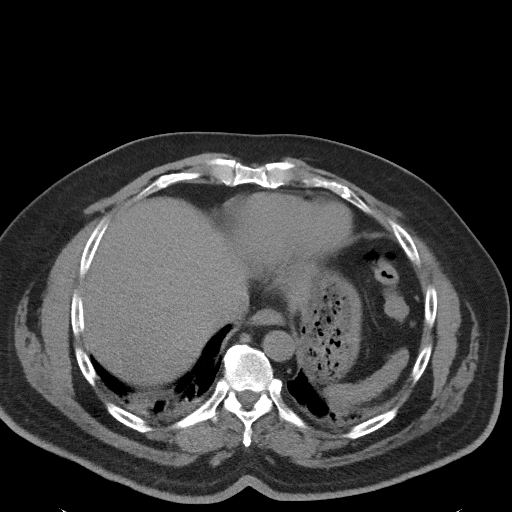
[im 100/105  soft-tissue]
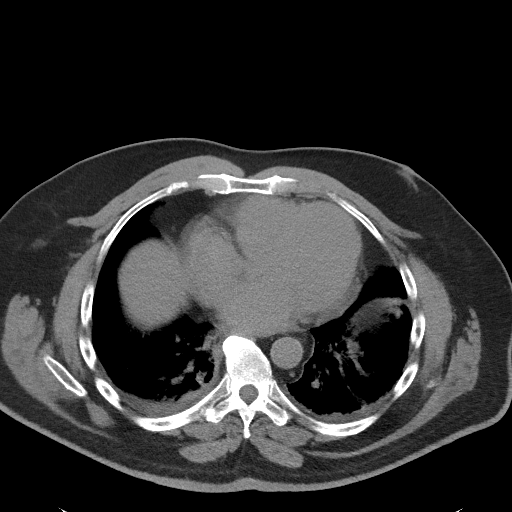

[Series 5: coronal st · coronal · 0.84mm/px · 3 of 120 slices shown]
[im 40/120  soft-tissue]
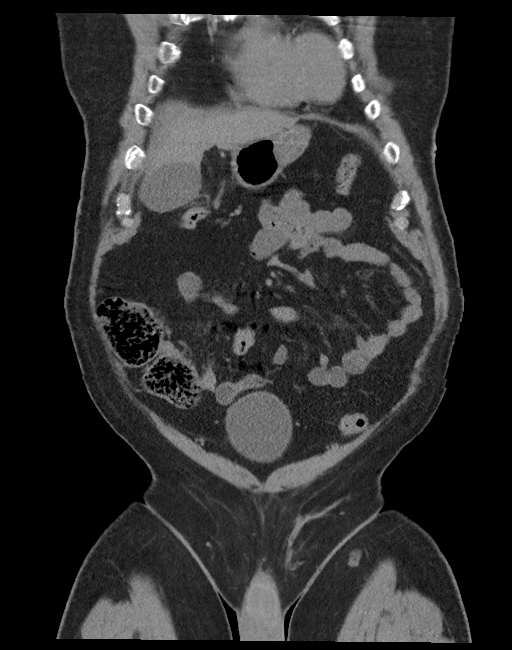
[im 53/120  soft-tissue]
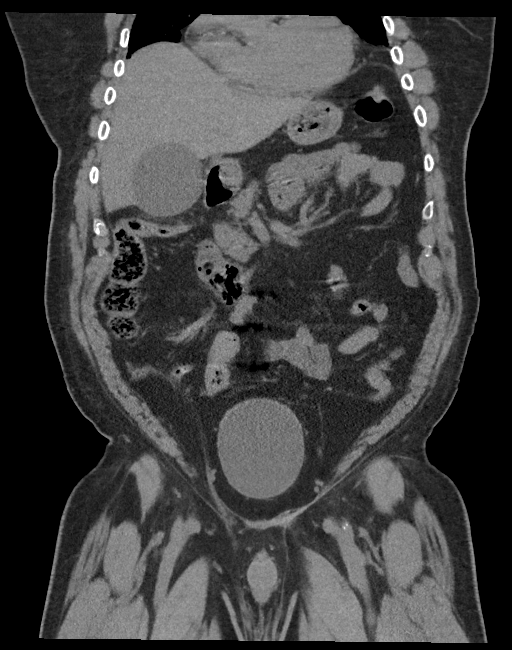
[im 67/120  soft-tissue]
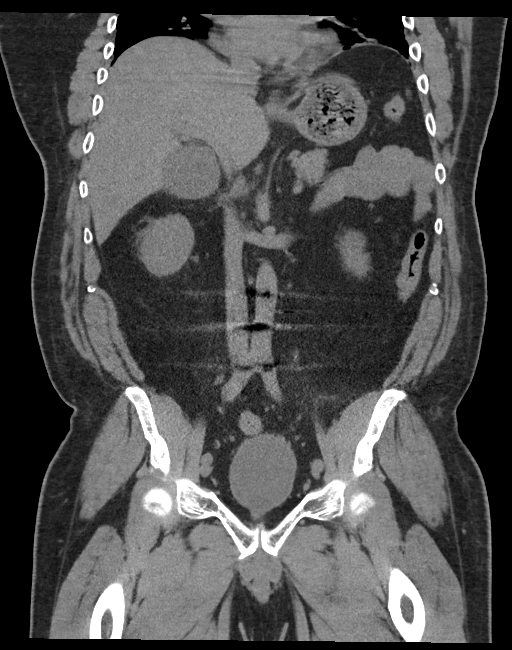

[16 of 46 positions shown; findings below may reference images not displayed]

FINDINGS: Lower chest: Mild bilateral dependent atelectasis. Prominent
pulmonary vasculature. Enlarged heart. Mild coronary artery
calcification.

Hepatobiliary: Dilated gallbladder mild pericholecystic soft tissue
stranding. The previously demonstrated gallstones are not well
visualized by CT. Unremarkable liver.

Pancreas: Unremarkable. No pancreatic ductal dilatation or
surrounding inflammatory changes.

Spleen: Normal in size without focal abnormality.

Adrenals/Urinary Tract: Lower pole right renal cysts. Normal
appearing left kidney, adrenal glands, ureters and urinary bladder.

Stomach/Bowel: Scattered colonic diverticula without evidence of
diverticulitis. Normal appearing appendix, small bowel and stomach.

Vascular/Lymphatic: Mild atheromatous coronary artery
calcifications. No aneurysm or enlarged lymph nodes.

Reproductive: Moderately enlarged prostate gland.

Other: Small umbilical hernia containing fat. Small to
moderate-sized right inguinal hernia containing fat. Probable post
left inguinal hernia repair scarring.

Musculoskeletal: L3 through S1 laminectomy defects and pedicle screw
and rod fusion. Lumbar and lower thoracic spine degenerative
changes.
IMPRESSION: 1. Dilated gallbladder with mild pericholecystic soft tissue
stranding in addition to the cholelithiasis demonstrated on the
ultrasound earlier today. These findings are suspicious for interval
changes of acute cholecystitis.
2. Mild calcific coronary artery atherosclerosis.
3. Small to moderate-sized right inguinal hernia containing fat.
4. Small umbilical hernia containing fat.
5. Moderately prostatic hypertrophy.

## 2020-10-07 ENCOUNTER — Ambulatory Visit
Admission: EM | Admit: 2020-10-07 | Discharge: 2020-10-07 | Disposition: A | Discharge status: Home / Self Care | Payer: Medicare Other | Attending: Physician Assistant | Admitting: Physician Assistant

## 2020-10-07 ENCOUNTER — Encounter: Payer: Self-pay | Admitting: Emergency Medicine

## 2020-10-07 ENCOUNTER — Other Ambulatory Visit: Payer: Self-pay

## 2020-10-07 DIAGNOSIS — R300 Dysuria: Secondary | ICD-10-CM

## 2020-10-07 DIAGNOSIS — M545 Low back pain, unspecified: Secondary | ICD-10-CM

## 2020-10-07 DIAGNOSIS — N410 Acute prostatitis: Secondary | ICD-10-CM | POA: Diagnosis not present

## 2020-10-07 LAB — URINALYSIS, COMPLETE (UACMP) WITH MICROSCOPIC
Bacteria, UA: NONE SEEN
Bilirubin Urine: NEGATIVE
Glucose, UA: NEGATIVE mg/dL
Ketones, ur: NEGATIVE mg/dL
Leukocytes,Ua: NEGATIVE
Nitrite: NEGATIVE
Protein, ur: NEGATIVE mg/dL
Specific Gravity, Urine: 1.025 (ref 1.005–1.030)
pH: 5.5 (ref 5.0–8.0)

## 2020-10-07 MED ORDER — CIPROFLOXACIN HCL 500 MG PO TABS: 500.0000 mg | ORAL_TABLET | Freq: Two times a day (BID) | ORAL | 0 refills | Status: AC

## 2020-10-07 NOTE — ED Triage Notes (Signed)
Pt is present today with lower back and bilateral groin pain that started over a week ago. Pt states that he is experiencing frequent urination and slight odor.

## 2020-10-07 NOTE — ED Provider Notes (Signed)
[facility]    CSN: [id number] Arrival date & time: 10/07/20  8841      History   Chief Complaint Chief Complaint  Patient presents with  . Back Pain  . Groin Pain    HPI Ian Lucas is a 70 y.o. male presenting with complaints of dysuria, urinary frequency and urgency, urinary odor, lower abdominal cramping/pelvic pain and lower back discomfort.  Patient says that he is has been treated by his PCP for prostatitis with doxycycline for the past couple of weeks.  Patient says he took his last doxycycline pill this morning but his symptom started to get worse last night.  He states that he did improve significantly and symptoms pretty much resolved with the doxycycline until he returned yesterday.  Patient does have an appointment with PCP next week.  He denies any fevers, nausea/vomiting or diarrhea.  Denies any possible sexually transmitted infection.  Denies any testicular pain or swelling.  No groin pain.  Patient does have history of type 2 diabetes, nephrolithiasis, rheumatoid arthritis, hyperlipidemia and hypertension, sleep apnea, and BPH.  He has no other complaints today.  HPI  Past Medical History:  Diagnosis Date  . Arthritis    Rheumatoid  . Complication of anesthesia    has become combative following surgery  . Diabetes mellitus without complication (HCC)    Type 2  . History of kidney stones    x 2  . History of shingles   . Hypercholesteremia   . Hypertension   . Neuropathy   . Pancreatitis   . PTSD (post-traumatic stress disorder)   . Sleep apnea    CPAP machine  . Vertigo    occasional    Patient Active Problem List   Diagnosis Date Noted  . Elevated lipase 11/15/2018  . Generalized abdominal pain   . Chronic pain syndrome 03/08/2016  . 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 Elevated prostate specific antigen (PSA) 06/18/2015  . Benign essential HTN 06/18/2015  . Chronic midline low back pain with bilateral sciatica  06/18/2015  . Diabetic neuropathy (HCC) 06/18/2015  . Posttraumatic stress disorder 06/18/2015  . Dyslipidemia 06/18/2015  . Lichen simplex 06/18/2015  . History of herpes zoster 06/18/2015  . Restless leg 06/18/2015  . OSA (obstructive sleep apnea) 06/18/2015  . Polyneuropathy 06/18/2015  . 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 Calculus of kidney 08/21/2013  . History of pancreatitis 08/15/2013  . Hydrocele 05/19/2013  . Arthritis of knee, degenerative 10/30/2012  . Lumbar canal stenosis 10/30/2012  . Chronic prostatitis 06/19/2012  . Hypogonadism in male 06/19/2012  . 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 Benign prostatic hyperplasia with urinary obstruction 06/19/2012    Past Surgical History:  Procedure Laterality Date  . BACK SURGERY     x 2  . CATARACT EXTRACTION W/PHACO Left 04/20/2020   Procedure: CATARACT EXTRACTION PHACO AND INTRAOCULAR LENS PLACEMENT (IOC) LEFT DIABETIC 4.37 00:48.7;  Surgeon: Galen Manila, MD;  Location: [facility];  Service: Ophthalmology;  Laterality: Left;  . CATARACT EXTRACTION W/PHACO Right 05/18/2020   Procedure: CATARACT EXTRACTION PHACO AND INTRAOCULAR LENS PLACEMENT (IOC) RIGHT DIABETIC;  Surgeon: Galen Manila, MD;  Location: [facility];  Service: Ophthalmology;  Laterality: Right;  4.15 0:35.6  . CHOLECYSTECTOMY N/A 11/15/2018   Procedure: LAPAROSCOPIC CHOLECYSTECTOMY;  Surgeon: Leafy Ro, MD;  Location: [facility];  Service: General;  Laterality: N/A;  . CIRCUMCISION N/A   . COLONOSCOPY    . HERNIA REPAIR  Hiatal  . HYDROCELE EXCISION / REPAIR Left        Home Medications    Prior to Admission medications   Medication Sig Start Date End Date Taking? Authorizing Provider  ciprofloxacin (CIPRO) 500 MG tablet Take 1 tablet (500 mg total) by mouth every 12 (twelve) hours for 14 days. 10/07/20 10/21/20 Yes Shirlee Latch, PA-C  albuterol (VENTOLIN HFA) 108 (90 Base)  MCG/ACT inhaler Inhale into the lungs every 6 (six) hours as needed for wheezing or shortness of breath.    [provider]  Alogliptin Benzoate 25 MG TABS Take 25 mg by mouth daily.     [provider]  amLODipine (NORVASC) 10 MG tablet Take 10 mg by mouth daily.     [provider]  aspirin 81 MG tablet Take 81 mg by mouth daily.    [provider]  benzonatate (TESSALON) 100 MG capsule Take 2 capsules (200 mg total) by mouth every 8 (eight) hours. 08/19/20   Becky Augusta, NP  buPROPion (WELLBUTRIN XL) 150 MG 24 hr tablet Take 150 mg by mouth daily.    [provider]  carvedilol (COREG) 12.5 MG tablet Take 12.5 mg by mouth 2 (two) times daily with a meal.     [provider]  diclofenac sodium (VOLTAREN) 1 % GEL Apply 2 g topically every 6 (six) hours.    [provider]  DULoxetine (CYMBALTA) 60 MG capsule Take 60 mg by mouth daily.     [provider]  hydroxychloroquine (PLAQUENIL) 200 MG tablet Take 400 mg by mouth daily.     [provider]  ibuprofen (ADVIL,MOTRIN) 600 MG tablet Take 1 tablet (600 mg total) by mouth every 6 (six) hours as needed. Patient taking differently: Take 800 mg by mouth 2 (two) times daily as needed. 05/20/16   Domenick Gong, MD  losartan (COZAAR) 100 MG tablet Take 100 mg by mouth daily.  06/19/12   [provider]  Melatonin 5 MG TABS Take 15 mg by mouth at bedtime.    [provider]  memantine (NAMENDA) 10 MG tablet Take 10 mg by mouth daily.    [provider]  metFORMIN (GLUCOPHAGE) 500 MG tablet Take 500 mg by mouth 2 (two) times daily with a meal.    [provider]  omeprazole (PRILOSEC) 20 MG capsule Take 20 mg by mouth daily.    [provider]  pravastatin (PRAVACHOL) 80 MG tablet Take 80 mg by mouth at bedtime.    [provider]  prazosin (MINIPRESS) 1 MG capsule Take 1 mg by mouth 3 (three) times daily.    [provider]  predniSONE (STERAPRED UNI-PAK 21 TAB) 10 MG (21) TBPK tablet Take by mouth daily. Take 6 tabs by mouth daily  for 2 days, then 5 tabs for 2 days, then 4 tabs for 2 days, then 3 tabs for 2 days, 2 tabs for 2 days, then 1 tab by mouth daily for 2 days 08/19/20   Becky Augusta, NP  pregabalin (LYRICA) 300 MG capsule Take 300 mg by mouth 2 (two) times daily.    [provider]  sildenafil (REVATIO) 20 MG tablet Take 20 mg by mouth as needed.    [provider]  Spacer/Aero-Holding Chambers (AEROCHAMBER MV) inhaler Use as instructed 08/19/20   Becky Augusta, NP  tamsulosin (FLOMAX) 0.4 MG CAPS capsule Take 0.4 mg by mouth 2 (two) times a day.  10/21/12   [provider]  tiotropium (SPIRIVA) 18 MCG inhalation capsule Place 1 capsule (18 mcg total) into inhaler and inhale daily. 03/06/17   Shane Crutch, MD  Tiotropium Bromide Monohydrate (SPIRIVA RESPIMAT) 1.25 MCG/ACT AERS Inhale into the lungs.    [provider]  insulin glargine (LANTUS) 100 UNIT/ML injection Inject 40 Units into the skin daily.  Patient not taking: No sig reported 11/06/17 08/19/20  [provider]    Family History Family History  Problem Relation Age of Onset  . Stroke Mother   . Diabetes Father     Social History Social History   Tobacco Use  . Smoking status: Former Smoker    Packs/day: 0.25    Years: 30.00    Pack years: 7.50    Types: Cigarettes    Start date: 05/08/1968    Quit date: 06/17/1998    Years since quitting: 22.3  . Smokeless tobacco: Never Used  Vaping Use  . Vaping Use: Never used  Substance Use Topics  . Alcohol use: No    Alcohol/week: 0.0 standard drinks  . Drug use: No     Allergies   Banana, Codeine, Iodine, Peanut oil, Ace inhibitors, Gabapentin, Iodinated diagnostic agents, and Metformin   Review of Systems Review of Systems  Constitutional: Negative for fatigue and fever.  Gastrointestinal: Positive for abdominal  pain. Negative for nausea and vomiting.  Genitourinary: Positive for difficulty urinating, dysuria, frequency and urgency. Negative for genital sores, hematuria, penile discharge and testicular pain.  Musculoskeletal: Positive for back pain.  Neurological: Negative for weakness and numbness.     Physical Exam Triage Vital Signs ED Triage Vitals  Enc Vitals Group     BP 10/07/20 0835 (!) 156/78     Pulse Rate 10/07/20 0835 78     Resp 10/07/20 0835 17     Temp 10/07/20 0835 98.3 F (36.8 C)     Temp Source 10/07/20 0835 Oral     SpO2 10/07/20 0835 100 %     Weight --      Height --      Head Circumference --      Peak Flow --      Pain Score 10/07/20 0833 6     Pain Loc --      Pain Edu? --      Excl. in GC? --    No data found.  Updated Vital Signs BP (!) 156/78 (BP Location: Left Arm)   Pulse 78   Temp 98.3 F (36.8 C) (Oral)   Resp 17   SpO2 100%       Physical Exam Vitals and nursing note reviewed.  Constitutional:      General: He is not in acute distress.    Appearance: Normal appearance. He is well-developed. He is not ill-appearing.  HENT:     Head: Normocephalic and atraumatic.  Eyes:     General: No scleral icterus.    Conjunctiva/sclera: Conjunctivae normal.  Cardiovascular:     Rate and Rhythm: Normal rate and regular rhythm.     Heart sounds: Normal heart sounds.  Pulmonary:     Effort: Pulmonary effort is normal. No respiratory distress.     Breath sounds: Normal breath sounds.  Abdominal:     Palpations: Abdomen is soft.     Tenderness: There is no abdominal tenderness. There is no right CVA tenderness or left CVA tenderness.  Musculoskeletal:     Cervical back: Neck supple.  Skin:    General: Skin is warm and dry.  Neurological:     General: No focal deficit present.     Mental Status: He is alert. Mental status is at baseline.     Motor: No weakness.     Gait: Gait normal.  Psychiatric:        Mood and Affect: Mood normal.         Behavior: Behavior normal.        Thought Content: Thought content normal.      UC Treatments / Results  Labs (all labs ordered are listed, but only abnormal results are displayed) Labs Reviewed  URINALYSIS, COMPLETE (UACMP) WITH MICROSCOPIC - Abnormal; Notable for the following components:      Result Value   Hgb urine dipstick TRACE (*)    All other components within normal limits  URINE CULTURE    EKG   Radiology No results found.  Procedures Procedures (including critical care time)  Medications Ordered in UC Medications - No data to display  Initial Impression / Assessment and Plan / UC Course  I have reviewed the triage vital signs and the nursing notes.  Pertinent labs & imaging results that were available during my care of the patient were reviewed by me and considered in my medical decision making (see chart for details).   70 year old male presenting for dysuria, urinary frequency and urgency as well as lower abdominal cramping and back discomfort.  Patient has recent history of prostatitis.  Recently treated with doxycycline.  He does go to the [place] so I am unable to review his medical records but he did bring in the bottle of doxycycline.  Urinalysis today shows trace hemoglobin, otherwise normal.  We will culture urine.  We will go ahead and treat him for prostatitis given his symptoms and recent history of it.  Switching him to Cipro at this time.  Advised him to increase rest and fluids and to follow-up with his PCP next week as scheduled.  Reviewed ED red flag signs and symptoms with patient.  Final Clinical Impressions(s) / UC Diagnoses   Final diagnoses:  Acute prostatitis  Acute bilateral low back pain without sciatica  Dysuria     Discharge Instructions     I have sent in a different antibiotic for you.  The urine today looks okay but I am going to culture it.  Someone from our lab might call you if the urine culture looks okay inflated stop taking  the medication, but please do not.  Urine cultures with prostate infections do not always grow bacteria.  Increase fluids and rest.  Contact your PCP for appointment.  If you are not improving you will need further work-up.    ED Prescriptions    Medication Sig Dispense Auth. Provider   ciprofloxacin (CIPRO) 500 MG tablet Take 1 tablet (500 mg total) by mouth every 12 (twelve) hours for 14 days. 28 tablet Shirlee Latch, PA-C     PDMP not reviewed this encounter.   Shirlee Latch, PA-C 10/07/20 [phone]

## 2020-10-07 NOTE — Discharge Instructions (Addendum)
I have sent in a different antibiotic for you.  The urine today looks okay but I am going to culture it.  Someone from our lab might call you if the urine culture looks okay inflated stop taking the medication, but please do not.  Urine cultures with prostate infections do not always grow bacteria.  Increase fluids and rest.  Contact your PCP for appointment.  If you are not improving you will need further work-up.

## 2020-10-08 LAB — URINE CULTURE: Culture: NO GROWTH

## 2020-10-13 DIAGNOSIS — E113313 Type 2 diabetes mellitus with moderate nonproliferative diabetic retinopathy with macular edema, bilateral: Secondary | ICD-10-CM | POA: Diagnosis not present

## 2020-11-15 DIAGNOSIS — E113313 Type 2 diabetes mellitus with moderate nonproliferative diabetic retinopathy with macular edema, bilateral: Secondary | ICD-10-CM | POA: Diagnosis not present

## 2021-01-07 DIAGNOSIS — E113313 Type 2 diabetes mellitus with moderate nonproliferative diabetic retinopathy with macular edema, bilateral: Secondary | ICD-10-CM | POA: Diagnosis not present

## 2021-02-04 DIAGNOSIS — E113313 Type 2 diabetes mellitus with moderate nonproliferative diabetic retinopathy with macular edema, bilateral: Secondary | ICD-10-CM | POA: Diagnosis not present

## 2021-03-04 DIAGNOSIS — E113313 Type 2 diabetes mellitus with moderate nonproliferative diabetic retinopathy with macular edema, bilateral: Secondary | ICD-10-CM | POA: Diagnosis not present

## 2021-03-30 DIAGNOSIS — E113313 Type 2 diabetes mellitus with moderate nonproliferative diabetic retinopathy with macular edema, bilateral: Secondary | ICD-10-CM | POA: Diagnosis not present

## 2021-05-05 DIAGNOSIS — E113313 Type 2 diabetes mellitus with moderate nonproliferative diabetic retinopathy with macular edema, bilateral: Secondary | ICD-10-CM | POA: Diagnosis not present

## 2022-01-15 ENCOUNTER — Ambulatory Visit (INDEPENDENT_AMBULATORY_CARE_PROVIDER_SITE_OTHER): Payer: Medicare Other

## 2022-01-15 ENCOUNTER — Encounter: Payer: Self-pay | Admitting: Emergency Medicine

## 2022-01-15 ENCOUNTER — Ambulatory Visit
Admission: EM | Admit: 2022-01-15 | Discharge: 2022-01-15 | Disposition: A | Discharge status: Home / Self Care | Payer: Medicare Other | Attending: Family Medicine | Admitting: Family Medicine

## 2022-01-15 DIAGNOSIS — Z20822 Contact with and (suspected) exposure to covid-19: Secondary | ICD-10-CM | POA: Diagnosis not present

## 2022-01-15 DIAGNOSIS — R059 Cough, unspecified: Secondary | ICD-10-CM

## 2022-01-15 DIAGNOSIS — J189 Pneumonia, unspecified organism: Secondary | ICD-10-CM | POA: Diagnosis not present

## 2022-01-15 DIAGNOSIS — J029 Acute pharyngitis, unspecified: Secondary | ICD-10-CM | POA: Insufficient documentation

## 2022-01-15 LAB — SARS CORONAVIRUS 2 BY RT PCR: SARS Coronavirus 2 by RT PCR: NEGATIVE

## 2022-01-15 MED ORDER — ALBUTEROL SULFATE HFA 108 (90 BASE) MCG/ACT IN AERS: 2.0000 | INHALATION_SPRAY | Freq: Four times a day (QID) | RESPIRATORY_TRACT | 0 refills | Status: AC | PRN

## 2022-01-15 MED ORDER — AMOXICILLIN 875 MG PO TABS: 875.0000 mg | ORAL_TABLET | Freq: Two times a day (BID) | ORAL | 0 refills | Status: AC

## 2022-01-15 MED ORDER — AZITHROMYCIN 250 MG PO TABS: ORAL_TABLET | ORAL | 0 refills | Status: AC

## 2022-01-15 NOTE — ED Provider Notes (Signed)
[facility]    CSN: [id number] Arrival date & time: 01/15/22  0955      History   Chief Complaint Chief Complaint  Patient presents with   Cough    HPI AZAAN MERKER is a 71 y.o. male.   HPI   Kaream presents with his wife for ongoing cough.  States he started having a sore throat and nasal congestion and then the next day the cough began.  Symptoms started on Thursday.  He has felt more wheezing that he normally has but denies any shortness of breath.  He does not have history of asthma or COPD.  [facility] and [facility] that [facility] have done CT scans and everything and have found anything.  He is not sure if he ever had any fevers.  He has not had any nausea, vomiting, belly pain.  He has chronic back pain.  He has intermittent headache.  Of note, his wife has no symptoms.  States that they did not go anywhere.   Past Medical History:  Diagnosis Date   Arthritis    Rheumatoid   Complication of anesthesia    has become combative following surgery   Diabetes mellitus without complication (HCC)    Type 2   History of kidney stones    x 2   History of shingles    Hypercholesteremia    Hypertension    Neuropathy    Pancreatitis    PTSD (post-traumatic stress disorder)    Sleep apnea    CPAP machine   Vertigo    occasional    Patient Active Problem List   Diagnosis Date Noted   Elevated lipase 11/15/2018   Generalized abdominal pain    Chronic pain syndrome 03/08/2016   Ilioinguinal neuralgia of left side 03/08/2016   Elevated prostate specific antigen (PSA) 06/18/2015   Benign essential HTN 06/18/2015   Chronic midline low back pain with bilateral sciatica 06/18/2015   Diabetic neuropathy (HCC) 06/18/2015   Posttraumatic stress disorder 06/18/2015   Dyslipidemia 06/18/2015   Lichen simplex 06/18/2015   History of herpes zoster 06/18/2015   Restless leg 06/18/2015   OSA (obstructive sleep apnea) 06/18/2015   Polyneuropathy 06/18/2015    Idiopathic scoliosis and kyphoscoliosis 12/10/2013   Calculus of kidney 08/21/2013   History of pancreatitis 08/15/2013   Hydrocele 05/19/2013   Arthritis of knee, degenerative 10/30/2012   Lumbar canal stenosis 10/30/2012   Chronic prostatitis 06/19/2012   Hypogonadism in male 06/19/2012   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   Benign prostatic hyperplasia with urinary obstruction 06/19/2012    Past Surgical History:  Procedure Laterality Date   BACK SURGERY     x 2   CATARACT EXTRACTION W/PHACO Left 04/20/2020   Procedure: CATARACT EXTRACTION PHACO AND INTRAOCULAR LENS PLACEMENT (IOC) LEFT DIABETIC 4.37 00:48.7;  Surgeon: Galen Manila, MD;  Location: [facility];  Service: Ophthalmology;  Laterality: Left;   CATARACT EXTRACTION W/PHACO Right 05/18/2020   Procedure: CATARACT EXTRACTION PHACO AND INTRAOCULAR LENS PLACEMENT (IOC) RIGHT DIABETIC;  Surgeon: Galen Manila, MD;  Location: [facility];  Service: Ophthalmology;  Laterality: Right;  4.15 0:35.6   CHOLECYSTECTOMY N/A 11/15/2018   Procedure: LAPAROSCOPIC CHOLECYSTECTOMY;  Surgeon: Leafy Ro, MD;  Location: [facility];  Service: General;  Laterality: N/A;   CIRCUMCISION N/A    COLONOSCOPY     HERNIA REPAIR     Hiatal   HYDROCELE EXCISION / REPAIR Left  Home Medications    Prior to Admission medications   Medication Sig Start Date End Date Taking? Authorizing Provider  amoxicillin (AMOXIL) 875 MG tablet Take 1 tablet (875 mg total) by mouth 2 (two) times daily for 5 days. 01/15/22 01/20/22 Yes Stephanee Barcomb, DO  azithromycin (ZITHROMAX Z-PAK) 250 MG tablet Take 2 tablets (500 mg total) by mouth daily for 1 day, THEN 1 tablet (250 mg total) daily for 4 days. 01/15/22 01/20/22 Yes Shronda Boeh, DO  albuterol (VENTOLIN HFA) 108 (90 Base) MCG/ACT inhaler Inhale 2 puffs into the lungs every 6 (six) hours as needed for wheezing or shortness of breath. 01/15/22   Katha Cabal,  DO  Alogliptin Benzoate 25 MG TABS Take 25 mg by mouth daily.     [provider]  amLODipine (NORVASC) 10 MG tablet Take 10 mg by mouth daily.     [provider]  aspirin 81 MG tablet Take 81 mg by mouth daily.    [provider]  benzonatate (TESSALON) 100 MG capsule Take 2 capsules (200 mg total) by mouth every 8 (eight) hours. 08/19/20   Becky Augusta, NP  buPROPion (WELLBUTRIN XL) 150 MG 24 hr tablet Take 150 mg by mouth daily.    [provider]  carvedilol (COREG) 12.5 MG tablet Take 12.5 mg by mouth 2 (two) times daily with a meal.     [provider]  diclofenac sodium (VOLTAREN) 1 % GEL Apply 2 g topically every 6 (six) hours.    [provider]  DULoxetine (CYMBALTA) 60 MG capsule Take 60 mg by mouth daily.     [provider]  hydroxychloroquine (PLAQUENIL) 200 MG tablet Take 400 mg by mouth daily.     [provider]  ibuprofen (ADVIL,MOTRIN) 600 MG tablet Take 1 tablet (600 mg total) by mouth every 6 (six) hours as needed. Patient taking differently: Take 800 mg by mouth 2 (two) times daily as needed. 05/20/16   Domenick Gong, MD  losartan (COZAAR) 100 MG tablet Take 100 mg by mouth daily.  06/19/12   [provider]  Melatonin 5 MG TABS Take 15 mg by mouth at bedtime.    [provider]  memantine (NAMENDA) 10 MG tablet Take 10 mg by mouth daily.    [provider]  metFORMIN (GLUCOPHAGE) 500 MG tablet Take 500 mg by mouth 2 (two) times daily with a meal.    [provider]  omeprazole (PRILOSEC) 20 MG capsule Take 20 mg by mouth daily.    [provider]  pravastatin (PRAVACHOL) 80 MG tablet Take 80 mg by mouth at bedtime.    [provider]  prazosin (MINIPRESS) 1 MG capsule Take 1 mg by mouth 3 (three) times daily.    [provider]  predniSONE (STERAPRED UNI-PAK 21 TAB) 10 MG (21) TBPK tablet Take by mouth daily. Take 6 tabs by mouth daily  for  2 days, then 5 tabs for 2 days, then 4 tabs for 2 days, then 3 tabs for 2 days, 2 tabs for 2 days, then 1 tab by mouth daily for 2 days 08/19/20   Becky Augusta, NP  pregabalin (LYRICA) 300 MG capsule Take 300 mg by mouth 2 (two) times daily.    [provider]  sildenafil (REVATIO) 20 MG tablet Take 20 mg by mouth as needed.    [provider]  Spacer/Aero-Holding Chambers (AEROCHAMBER MV) inhaler Use as instructed 08/19/20   Becky Augusta, NP  tamsulosin [facility]) 0.4  MG CAPS capsule Take 0.4 mg by mouth 2 (two) times a day.  10/21/12   [provider]  tiotropium (SPIRIVA) 18 MCG inhalation capsule Place 1 capsule (18 mcg total) into inhaler and inhale daily. 03/06/17   Shane Crutch, MD  Tiotropium Bromide Monohydrate (SPIRIVA RESPIMAT) 1.25 MCG/ACT AERS Inhale into the lungs.    [provider]  insulin glargine (LANTUS) 100 UNIT/ML injection Inject 40 Units into the skin daily.  Patient not taking: No sig reported 11/06/17 08/19/20  [provider]    Family History Family History  Problem Relation Age of Onset   Stroke Mother    Diabetes Father     Social History Social History   Tobacco Use   Smoking status: Former    Packs/day: 0.25    Years: 30.00    Total pack years: 7.50    Types: Cigarettes    Start date: 05/08/1968    Quit date: 06/17/1998    Years since quitting: 23.5   Smokeless tobacco: Never  Vaping Use   Vaping Use: Never used  Substance Use Topics   Alcohol use: No    Alcohol/week: 0.0 standard drinks of alcohol   Drug use: No     Allergies   Banana, Codeine, Iodine, Peanut oil, Ace inhibitors, Gabapentin, Iodinated contrast media, and Metformin   Review of Systems Review of Systems: negative unless otherwise stated in HPI.      Physical Exam Triage Vital Signs ED Triage Vitals  Enc Vitals Group     BP 01/15/22 1036 134/88     Pulse Rate 01/15/22 1036 66     Resp 01/15/22 1036 15     Temp 01/15/22  1036 98.4 F (36.9 C)     Temp Source 01/15/22 1036 Oral     SpO2 01/15/22 1036 95 %     Weight 01/15/22 1033 215 lb (97.5 kg)     Height 01/15/22 1033 5\' 6"  (1.676 m)     Head Circumference --      Peak Flow --      Pain Score 01/15/22 1033 0     Pain Loc --      Pain Edu? --      Excl. in GC? --    No data found.  Updated Vital Signs BP 134/88 (BP Location: Right Arm)   Pulse 66   Temp 98.4 F (36.9 C) (Oral)   Resp 15   Ht 5\' 6"  (1.676 m)   Wt 97.5 kg   SpO2 95%   BMI 34.70 kg/m   Visual Acuity Right Eye Distance:   Left Eye Distance:   Bilateral Distance:    Right Eye Near:   Left Eye Near:    Bilateral Near:     Physical Exam GEN:     alert, non-toxic appearing male in no distress    HENT:  mucus membranes moist, oropharyngeal without lesions or exudate, no tonsillar hypertrophy,  mild oropharyngeal erythema ,  moderate pale hypertrophied turbinates, clear nasal discharge, bilateral TM normal EYES:   pupils equal and reactive, no scleral injection NECK:  normal ROM, no lymphadenopathy appreciated RESP:  no increased work of breathing, +rales, no wheezing, no rhonchi CVS:   regular rate and rhythm Skin:   warm and dry    UC Treatments / Results  Labs (all labs ordered are listed, but only abnormal results are displayed) Labs Reviewed  SARS CORONAVIRUS 2 BY RT PCR    EKG   Radiology DG Chest 2  View  Result Date: 01/15/2022 CLINICAL DATA:  Cough, chest congestion, headache. EXAM: CHEST - 2 VIEW COMPARISON:  08/19/2020 FINDINGS: The heart size and mediastinal contours are within normal limits. Both lungs are clear. The visualized skeletal structures are unremarkable. IMPRESSION: No active cardiopulmonary disease. Electronically Signed   By: Signa Kell M.D.   On: 01/15/2022 11:21    Procedures Procedures (including critical care time)  Medications Ordered in UC Medications - No data to display  Initial Impression / Assessment and Plan / UC  Course  I have reviewed the triage vital signs and the nursing notes.  Pertinent labs & imaging results that were available during my care of the patient were reviewed by me and considered in my medical decision making (see chart for details).       Pt is a 71 y.o. male who presents for 4 days of respiratory symptoms. Jamaree is afebrile here without recent antipyretics. Satting adequately on room air. Overall pt is well hydrated, without respiratory distress.  Rales auscultated on pulmonary exam. COVID testing obtained and was negative.  Chest x-ray obtained and was unremarkable for pleural effusion, focal pneumonia, pneumothorax.  Given his decreased oxygen saturation and exam gave him antibiotics for presumed community-acquired pneumonia.  Albuterol inhaler sent as well.  Patient to follow-up with his PCP if not improving in a couple of days.  If symptoms acutely worsen, ED precautions given, he and his wife voiced understanding.   Discussed MDM, treatment plan and plan for follow-up with patient/parent who agrees with plan.     Final Clinical Impressions(s) / UC Diagnoses   Final diagnoses:  Community acquired pneumonia, unspecified laterality     Discharge Instructions      Stop by the pharmacy to pick up your antibiotics.  If you start having increasing shortness of breath,  seek medical care.     ED Prescriptions     Medication Sig Dispense Auth. Provider   amoxicillin (AMOXIL) 875 MG tablet Take 1 tablet (875 mg total) by mouth 2 (two) times daily for 5 days. 10 tablet Rhianna Raulerson, DO   azithromycin (ZITHROMAX Z-PAK) 250 MG tablet Take 2 tablets (500 mg total) by mouth daily for 1 day, THEN 1 tablet (250 mg total) daily for 4 days. 6 tablet Olukemi Panchal, DO   albuterol (VENTOLIN HFA) 108 (90 Base) MCG/ACT inhaler Inhale 2 puffs into the lungs every 6 (six) hours as needed for wheezing or shortness of breath. 6.7 g Katha Cabal, DO      PDMP not reviewed this  encounter.   Katha Cabal, DO 01/15/22 2056

## 2022-01-15 NOTE — ED Triage Notes (Signed)
Patient c/o cough and chest congestion and headache that started on Thursday.  Patient unsure of fevers.

## 2022-01-15 NOTE — Discharge Instructions (Addendum)
Stop by the pharmacy to pick up your antibiotics.  If you start having increasing shortness of breath,  seek medical care.

## 2022-04-05 LAB — HM DIABETES EYE EXAM

## 2022-05-11 ENCOUNTER — Ambulatory Visit
Admission: EM | Admit: 2022-05-11 | Discharge: 2022-05-11 | Disposition: A | Discharge status: Home / Self Care | Payer: No Typology Code available for payment source | Attending: Family Medicine | Admitting: Family Medicine

## 2022-05-11 ENCOUNTER — Encounter: Payer: Self-pay | Admitting: Emergency Medicine

## 2022-05-11 DIAGNOSIS — U071 COVID-19: Secondary | ICD-10-CM | POA: Diagnosis not present

## 2022-05-11 DIAGNOSIS — Z87891 Personal history of nicotine dependence: Secondary | ICD-10-CM | POA: Diagnosis present

## 2022-05-11 DIAGNOSIS — R053 Chronic cough: Secondary | ICD-10-CM

## 2022-05-11 LAB — RESP PANEL BY RT-PCR (RSV, FLU A&B, COVID)  RVPGX2
Influenza A by PCR: NEGATIVE
Influenza B by PCR: NEGATIVE
Resp Syncytial Virus by PCR: NEGATIVE
SARS Coronavirus 2 by RT PCR: POSITIVE — AB

## 2022-05-11 MED ORDER — PREDNISONE 20 MG PO TABS: 40.0000 mg | ORAL_TABLET | Freq: Every day | ORAL | 0 refills | Status: AC

## 2022-05-11 MED ORDER — DOXYCYCLINE HYCLATE 100 MG PO CAPS: 100.0000 mg | ORAL_CAPSULE | Freq: Two times a day (BID) | ORAL | 0 refills | Status: AC

## 2022-05-11 NOTE — Discharge Instructions (Addendum)
Your test for COVID-19 was positive, meaning that you were infected with the novel coronavirus and could give the germ to others.  Please continue isolation at home for at least 5 days since the start of your symptoms. Once you complete your 5 day quarantine, you may return to normal activities as long as you've not had a fever for over 24 hours(without taking fever reducing medicine) and your symptoms are improving. Be sure to wear a mask until Day 11.   If your were prescribed medication. Stop by the pharmacy to pick it up.   Please continue good preventive care measures, including:  frequent hand-washing, avoid touching your face, cover coughs/sneezes, stay out of crowds and keep a 6 foot distance from others.  Go to the nearest hospital emergency room if fever/cough/breathlessness are severe or illness seems like a threat to life.  You can take Tylenol and/or Ibuprofen as needed for fever reduction and pain relief.    For cough: honey 1/2 to 1 teaspoon (you can dilute the honey in water or another fluid).  You can also use guaifenesin and dextromethorphan for cough. You can use a humidifier for chest congestion and cough.  If you don't have a humidifier, you can sit in the bathroom with the hot shower running.      For sore throat: try warm salt water gargles, Mucinex sore throat cough drops or cepacol lozenges, throat spray, warm tea or water with lemon/honey, popsicles or ice, or OTC cold relief medicine for throat discomfort. You can also purchase chloraseptic spray at the pharmacy or dollar store.   For congestion: take a daily anti-histamine like Zyrtec, Claritin, and a oral decongestant, such as pseudoephedrine.  You can also use Flonase 1-2 sprays in each nostril daily. Afrin is also a good option, if you do not have high blood pressure.    It is important to stay hydrated: drink plenty of fluids (water, gatorade/powerade/pedialyte, juices, or teas) to keep your throat moisturized and help  further relieve irritation/discomfort.    Return or go to the Emergency Department if symptoms worsen or do not improve in the next few days  

## 2022-05-11 NOTE — ED Triage Notes (Signed)
Pt presents with a productive cough, congestion and headache x 3-4 days.

## 2022-05-11 NOTE — ED Provider Notes (Signed)
[facility]    CSN: [id number] Arrival date & time: 05/11/22  1132      History   Chief Complaint Chief Complaint  Patient presents with   Cough   Headache   Nasal Congestion    HPI Ian Lucas is a 72 y.o. male.   HPI   Hence presents for productive cough of yellow sputum for the past 5 days with headache and nasal congestion.  Ian Lucas denies fever, nausea, vomiting, diarrhea, headache.  Had a sore throat that resolved.  States Ian Lucas follows with a lung specialist at the [place].  Denies history of COPD or asthma but Ian Lucas does have history of smoking. Has been having a cough for the past 2-3 months.  Of note, his wife had COVID over Christmas.       Past Medical History:  Diagnosis Date   Arthritis    Rheumatoid   Complication of anesthesia    has become combative following surgery   Diabetes mellitus without complication ([place])    Type 2   History of kidney stones    x 2   History of shingles    Hypercholesteremia    Hypertension    Neuropathy    Pancreatitis    PTSD (post-traumatic stress disorder)    Sleep apnea    CPAP machine   Vertigo    occasional    Patient Active Problem List   Diagnosis Date Noted   Elevated lipase 11/15/2018   Generalized abdominal pain    Chronic pain syndrome 03/08/2016   Ilioinguinal neuralgia of left side 03/08/2016   Elevated prostate specific antigen (PSA) 06/18/2015   Benign essential HTN 06/18/2015   Chronic midline low back pain with bilateral sciatica 06/18/2015   Diabetic neuropathy ([place]) 06/18/2015   Posttraumatic stress disorder 06/18/2015   Dyslipidemia [id number]   Lichen simplex [id number]   History of herpes zoster 06/18/2015   Restless leg 06/18/2015   OSA (obstructive sleep apnea) 06/18/2015   Polyneuropathy 06/18/2015   Idiopathic scoliosis and kyphoscoliosis 12/10/2013   Calculus of kidney 08/21/2013   History of pancreatitis 08/15/2013   Hydrocele 05/19/2013   Arthritis of knee, degenerative  10/30/2012   Lumbar canal stenosis 10/30/2012   Chronic prostatitis 06/19/2012   Hypogonadism in male 06/19/2012   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   Benign prostatic hyperplasia with urinary obstruction 06/19/2012    Past Surgical History:  Procedure Laterality Date   BACK SURGERY     x 2   CATARACT EXTRACTION W/PHACO Left 04/20/2020   Procedure: CATARACT EXTRACTION PHACO AND INTRAOCULAR LENS PLACEMENT (IOC) LEFT DIABETIC 4.37 00:48.7;  Surgeon: Birder Robson, MD;  Location: [facility];  Service: Ophthalmology;  Laterality: Left;   CATARACT EXTRACTION W/PHACO Right 05/18/2020   Procedure: CATARACT EXTRACTION PHACO AND INTRAOCULAR LENS PLACEMENT ([place]) RIGHT DIABETIC;  Surgeon: Birder Robson, MD;  Location: [facility];  Service: Ophthalmology;  Laterality: Right;  4.15 0:35.6   CHOLECYSTECTOMY N/A 11/15/2018   Procedure: LAPAROSCOPIC CHOLECYSTECTOMY;  Surgeon: Jules Husbands, MD;  Location: [facility];  Service: General;  Laterality: N/A;   CIRCUMCISION N/A    COLONOSCOPY     HERNIA REPAIR     Hiatal   HYDROCELE EXCISION / REPAIR Left        Home Medications    Prior to Admission medications   Medication Sig Start Date End Date Taking? Authorizing Provider  albuterol (VENTOLIN HFA) 108 (90 Base) MCG/ACT inhaler Inhale 2 puffs into the lungs every 6 (  six) hours as needed for wheezing or shortness of breath. 01/15/22   Lyndee Hensen, DO  Alogliptin Benzoate 25 MG TABS Take 25 mg by mouth daily.     [provider]  amLODipine (NORVASC) 10 MG tablet Take 10 mg by mouth daily.     [provider]  aspirin 81 MG tablet Take 81 mg by mouth daily.    [provider]  benzonatate (TESSALON) 100 MG capsule Take 2 capsules (200 mg total) by mouth every 8 (eight) hours. 08/19/20   Margarette Canada, NP  buPROPion (WELLBUTRIN XL) 150 MG 24 hr tablet Take 150 mg by mouth daily.    [provider]  carvedilol (COREG)  12.5 MG tablet Take 12.5 mg by mouth 2 (two) times daily with a meal.     [provider]  diclofenac sodium (VOLTAREN) 1 % GEL Apply 2 g topically every 6 (six) hours.    [provider]  DULoxetine (CYMBALTA) 60 MG capsule Take 60 mg by mouth daily.     [provider]  hydroxychloroquine (PLAQUENIL) 200 MG tablet Take 400 mg by mouth daily.     [provider]  ibuprofen (ADVIL,MOTRIN) 600 MG tablet Take 1 tablet (600 mg total) by mouth every 6 (six) hours as needed. Patient taking differently: Take 800 mg by mouth 2 (two) times daily as needed. 05/20/16   Melynda Ripple, MD  losartan (COZAAR) 100 MG tablet Take 100 mg by mouth daily.  06/19/12   [provider]  Melatonin 5 MG TABS Take 15 mg by mouth at bedtime.    [provider]  memantine (NAMENDA) 10 MG tablet Take 10 mg by mouth daily.    [provider]  metFORMIN (GLUCOPHAGE) 500 MG tablet Take 500 mg by mouth 2 (two) times daily with a meal.    [provider]  omeprazole (PRILOSEC) 20 MG capsule Take 20 mg by mouth daily.    [provider]  pravastatin (PRAVACHOL) 80 MG tablet Take 80 mg by mouth at bedtime.    [provider]  prazosin (MINIPRESS) 1 MG capsule Take 1 mg by mouth 3 (three) times daily.    [provider]  predniSONE (STERAPRED UNI-PAK 21 TAB) 10 MG (21) TBPK tablet Take by mouth daily. Take 6 tabs by mouth daily  for 2 days, then 5 tabs for 2 days, then 4 tabs for 2 days, then 3 tabs for 2 days, 2 tabs for 2 days, then 1 tab by mouth daily for 2 days 08/19/20   Margarette Canada, NP  pregabalin (LYRICA) 300 MG capsule Take 300 mg by mouth 2 (two) times daily.    [provider]  sildenafil (REVATIO) 20 MG tablet Take 20 mg by mouth as needed.    [provider]  Spacer/Aero-Holding Chambers (AEROCHAMBER MV) inhaler Use as instructed 08/19/20   Margarette Canada, NP  tamsulosin (FLOMAX) 0.4 MG CAPS capsule Take 0.4  mg by mouth 2 (two) times a day.  10/21/12   [provider]  tiotropium (SPIRIVA) 18 MCG inhalation capsule Place 1 capsule (18 mcg total) into inhaler and inhale daily. 03/06/17   Laverle Hobby, MD  Tiotropium Bromide Monohydrate (SPIRIVA RESPIMAT) 1.25 MCG/ACT AERS Inhale into the lungs.    [provider]  insulin glargine (LANTUS) 100 UNIT/ML injection Inject 40 Units into the skin daily.  Patient not taking: No sig reported 11/06/17 08/19/20  [provider]    Family History Family History  Problem  Relation Age of Onset   Stroke Mother    Diabetes Father     Social History Social History   Tobacco Use   Smoking status: Former    Packs/day: 0.25    Years: 30.00    Total pack years: 7.50    Types: Cigarettes    Start date: 05/08/1968    Quit date: 06/17/1998    Years since quitting: 23.9   Smokeless tobacco: Never  Vaping Use   Vaping Use: Never used  Substance Use Topics   Alcohol use: No    Alcohol/week: 0.0 standard drinks of alcohol   Drug use: No     Allergies   Banana, Codeine, Iodine, Peanut oil, Ace inhibitors, Banana extract allergy skin test, Gabapentin, Iodinated contrast media, Lisinopril, Metformin, and Peanut-containing drug products   Review of Systems Review of Systems: negative unless otherwise stated in HPI.      Physical Exam Triage Vital Signs ED Triage Vitals  Enc Vitals Group     BP 05/11/22 1409 132/75     Pulse Rate 05/11/22 1409 60     Resp 05/11/22 1409 16     Temp 05/11/22 1409 97.8 F (36.6 C)     Temp Source 05/11/22 1409 Oral     SpO2 05/11/22 1409 100 %     Weight --      Height --      Head Circumference --      Peak Flow --      Pain Score 05/11/22 1408 0     Pain Loc --      Pain Edu? --      Excl. in [place]? --    No data found.  Updated Vital Signs BP 132/75 (BP Location: Left Arm)   Pulse 60   Temp 97.8 F (36.6 C) (Oral)   Resp 16   SpO2 100%   Visual Acuity Right Eye  Distance:   Left Eye Distance:   Bilateral Distance:    Right Eye Near:   Left Eye Near:    Bilateral Near:     Physical Exam GEN:     alert, non-toxic appearing male in no distress    HENT:  mucus membranes moist, oropharyngeal without lesions or exudate, no tonsillar hypertrophy, no nasal discharge EYES:   pupils equal and reactive, no scleral injection or discharge NECK:  normal ROM, no lymphadenopathy, no meningismus   RESP:  no increased work of breathing, rales in right mid lung with rhonchi that clear with cough CVS:   regular rate and rhythm Skin:   warm and dry    UC Treatments / Results  Labs (all labs ordered are listed, but only abnormal results are displayed) Labs Reviewed  RESP PANEL BY RT-PCR (RSV, FLU A&B, COVID)  [id number] - Abnormal; Notable for the following components:      Result Value   SARS Coronavirus 2 by RT PCR POSITIVE (*)    All other components within normal limits    EKG   Radiology No results found.  Procedures Procedures (including critical care time)  Medications Ordered in UC Medications - No data to display  Initial Impression / Assessment and Plan / UC Course  I have reviewed the triage vital signs and the nursing notes.  Pertinent labs & imaging results that were available during my care of the patient were reviewed by me and considered in my medical decision making (see chart for details).       Ian Lucas is a 71  y.o. male who presents for 5 days of respiratory symptoms with ongoing cough. Ian Lucas has recent COVID exposure.  Ian Lucas is afebrile here without recent antipyretics. Satting well on room air. Overall Ian Lucas is non-toxic appearing, well hydrated, without respiratory distress. Pulmonary exam is remarkable for mid right lung rales and rhonchi.  RSV, COVID and influenza testing obtained and Ian Lucas is COVID positive.  Ian Lucas is outside the [place] treatment window. History consistent with recent COVID exposure. I am unsure if this is acute COVID as wife  reports Ian Lucas had symptoms over Christmas when she did.  Recommend recommended chest x-ray to better evaluate cough for the past 2 to 3 months however they declined.  We discussed delayed use of antibiotics and steroids and they are agreeable.  Discussed symptomatic treatment. Typical duration of symptoms discussed.   Return and ED precautions given and voiced understanding. Discussed MDM, treatment plan and plan for follow-up with patient/wife who agree with plan.     Final Clinical Impressions(s) / UC Diagnoses   Final diagnoses:  COVID-19  Chronic cough  History of tobacco use     Discharge Instructions      Your test for COVID-19 was positive, meaning that you were infected with the novel coronavirus and could give the germ to others.  Please continue isolation at home for at least 5 days since the start of your symptoms. Once you complete your 5 day quarantine, you may return to normal activities as long as you've not had a fever for over 24 hours(without taking fever reducing medicine) and your symptoms are improving. Be sure to wear a mask until Day 11.   If your were prescribed medication. Stop by the pharmacy to pick it up.   Please continue good preventive care measures, including:  frequent hand-washing, avoid touching your face, cover coughs/sneezes, stay out of crowds and keep a 6 foot distance from others.  Go to the nearest hospital emergency room if fever/cough/breathlessness are severe or illness seems like a threat to life.  You can take Tylenol and/or Ibuprofen as needed for fever reduction and pain relief.    For cough: honey 1/2 to 1 teaspoon (you can dilute the honey in water or another fluid).  You can also use guaifenesin and dextromethorphan for cough. You can use a humidifier for chest congestion and cough.  If you don't have a humidifier, you can sit in the bathroom with the hot shower running.      For sore throat: try warm salt water gargles, Mucinex sore throat  cough drops or cepacol lozenges, throat spray, warm tea or water with lemon/honey, popsicles or ice, or OTC cold relief medicine for throat discomfort. You can also purchase chloraseptic spray at the pharmacy or dollar store.   For congestion: take a daily anti-histamine like Zyrtec, Claritin, and a oral decongestant, such as pseudoephedrine.  You can also use Flonase 1-2 sprays in each nostril daily. Afrin is also a good option, if you do not have high blood pressure.    It is important to stay hydrated: drink plenty of fluids (water, gatorade/powerade/pedialyte, juices, or teas) to keep your throat moisturized and help further relieve irritation/discomfort.    Return or go to the Emergency Department if symptoms worsen or do not improve in the next few days     ED Prescriptions   None    PDMP not reviewed this encounter.   Lyndee Hensen, DO 05/11/22 1550

## 2022-07-05 DIAGNOSIS — E113313 Type 2 diabetes mellitus with moderate nonproliferative diabetic retinopathy with macular edema, bilateral: Secondary | ICD-10-CM | POA: Diagnosis not present

## 2022-07-05 DIAGNOSIS — E11311 Type 2 diabetes mellitus with unspecified diabetic retinopathy with macular edema: Secondary | ICD-10-CM | POA: Diagnosis not present

## 2022-07-19 DIAGNOSIS — G20C Parkinsonism, unspecified: Secondary | ICD-10-CM | POA: Diagnosis not present

## 2022-07-19 DIAGNOSIS — G4752 REM sleep behavior disorder: Secondary | ICD-10-CM | POA: Diagnosis not present

## 2022-07-19 DIAGNOSIS — Z9189 Other specified personal risk factors, not elsewhere classified: Secondary | ICD-10-CM | POA: Diagnosis not present

## 2022-07-21 ENCOUNTER — Encounter: Payer: Self-pay | Admitting: Emergency Medicine

## 2022-07-21 ENCOUNTER — Ambulatory Visit (INDEPENDENT_AMBULATORY_CARE_PROVIDER_SITE_OTHER): Payer: No Typology Code available for payment source

## 2022-07-21 ENCOUNTER — Ambulatory Visit
Admission: EM | Admit: 2022-07-21 | Discharge: 2022-07-21 | Discharge status: Against Medical Advice | Payer: No Typology Code available for payment source | Attending: Nurse Practitioner | Admitting: Nurse Practitioner

## 2022-07-21 DIAGNOSIS — Z1152 Encounter for screening for COVID-19: Secondary | ICD-10-CM | POA: Insufficient documentation

## 2022-07-21 DIAGNOSIS — R0981 Nasal congestion: Secondary | ICD-10-CM | POA: Insufficient documentation

## 2022-07-21 DIAGNOSIS — J180 Bronchopneumonia, unspecified organism: Secondary | ICD-10-CM | POA: Diagnosis not present

## 2022-07-21 DIAGNOSIS — R600 Localized edema: Secondary | ICD-10-CM | POA: Diagnosis not present

## 2022-07-21 DIAGNOSIS — R051 Acute cough: Secondary | ICD-10-CM | POA: Diagnosis not present

## 2022-07-21 DIAGNOSIS — G4730 Sleep apnea, unspecified: Secondary | ICD-10-CM | POA: Insufficient documentation

## 2022-07-21 DIAGNOSIS — Z8701 Personal history of pneumonia (recurrent): Secondary | ICD-10-CM | POA: Insufficient documentation

## 2022-07-21 DIAGNOSIS — Z8616 Personal history of COVID-19: Secondary | ICD-10-CM | POA: Diagnosis not present

## 2022-07-21 LAB — CBC WITH DIFFERENTIAL/PLATELET
Abs Immature Granulocytes: 0.02 10*3/uL (ref 0.00–0.07)
Basophils Absolute: 0 10*3/uL (ref 0.0–0.1)
Basophils Relative: 0 %
Eosinophils Absolute: 0.1 10*3/uL (ref 0.0–0.5)
Eosinophils Relative: 2 %
HCT: 38.3 % — ABNORMAL LOW (ref 39.0–52.0)
Hemoglobin: 12.5 g/dL — ABNORMAL LOW (ref 13.0–17.0)
Immature Granulocytes: 0 %
Lymphocytes Relative: 18 %
Lymphs Abs: 1 10*3/uL (ref 0.7–4.0)
MCH: 29.8 pg (ref 26.0–34.0)
MCHC: 32.6 g/dL (ref 30.0–36.0)
MCV: 91.4 fL (ref 80.0–100.0)
Monocytes Absolute: 0.5 10*3/uL (ref 0.1–1.0)
Monocytes Relative: 8 %
Neutro Abs: 3.8 10*3/uL (ref 1.7–7.7)
Neutrophils Relative %: 72 %
Platelets: 124 10*3/uL — ABNORMAL LOW (ref 150–400)
RBC: 4.19 MIL/uL — ABNORMAL LOW (ref 4.22–5.81)
RDW: 14.3 % (ref 11.5–15.5)
WBC: 5.4 10*3/uL (ref 4.0–10.5)
nRBC: 0 % (ref 0.0–0.2)

## 2022-07-21 LAB — BASIC METABOLIC PANEL
Anion gap: 10 (ref 5–15)
BUN: 22 mg/dL (ref 8–23)
CO2: 22 mmol/L (ref 22–32)
Calcium: 8.1 mg/dL — ABNORMAL LOW (ref 8.9–10.3)
Chloride: 105 mmol/L (ref 98–111)
Creatinine, Ser: 1.28 mg/dL — ABNORMAL HIGH (ref 0.61–1.24)
GFR, Estimated: 59 mL/min — ABNORMAL LOW (ref >60)
Glucose, Bld: 212 mg/dL — ABNORMAL HIGH (ref 70–99)
Potassium: 4.1 mmol/L (ref 3.5–5.1)
Sodium: 137 mmol/L (ref 135–145)

## 2022-07-21 LAB — SARS CORONAVIRUS 2 BY RT PCR: SARS Coronavirus 2 by RT PCR: NEGATIVE

## 2022-07-21 MED ORDER — DOXYCYCLINE HYCLATE 100 MG PO CAPS: 100.0000 mg | ORAL_CAPSULE | Freq: Two times a day (BID) | ORAL | 0 refills | Status: DC

## 2022-07-21 MED ORDER — BENZONATATE 100 MG PO CAPS: 100.0000 mg | ORAL_CAPSULE | Freq: Three times a day (TID) | ORAL | 0 refills | Status: AC | PRN

## 2022-07-21 MED ORDER — IPRATROPIUM-ALBUTEROL 0.5-2.5 (3) MG/3ML IN SOLN
3.0000 mL | RESPIRATORY_TRACT | Status: AC
  Administered 2022-07-21: 3 mL via RESPIRATORY_TRACT

## 2022-07-21 MED ORDER — FLUTICASONE-SALMETEROL 115-21 MCG/ACT IN AERO: 2.0000 | INHALATION_SPRAY | Freq: Two times a day (BID) | RESPIRATORY_TRACT | 12 refills | Status: AC

## 2022-07-21 NOTE — ED Triage Notes (Signed)
Patient c/o cough, congestion, headache that started a week ago.  Patient reports fevers off and on.  Patient also has had some wheezing.

## 2022-07-21 NOTE — ED Provider Notes (Signed)
[facility]    CSN: [id number] Arrival date & time: 07/21/22  [id number]      History   Chief Complaint Chief Complaint  Patient presents with   Cough    HPI Ian Lucas is a 72 y.o. male.   HPI He is in today with his wife for cough and congestion.  This has been going on for approximately 1 week.  They report no known fever however has not been monitoring temperature.  He reports the cough is productive he is coughing up brown phlegm.  He does endorse shortness of breath when leaning forward.  He denies chest pain.  He does have a previous history of pneumonia.  He is currently being treated for sleep apnea.  He denies any nasal congestion. Past Medical History:  Diagnosis Date   Arthritis    Rheumatoid   Complication of anesthesia    has become combative following surgery   Diabetes mellitus without complication ([place])    Type 2   History of kidney stones    x 2   History of shingles    Hypercholesteremia    Hypertension    Neuropathy    Pancreatitis    PTSD (post-traumatic stress disorder)    Sleep apnea    CPAP machine   Vertigo    occasional    Patient Active Problem List   Diagnosis Date Noted   Elevated lipase 11/15/2018   Generalized abdominal pain    Chronic pain syndrome 03/08/2016   Ilioinguinal neuralgia of left side 03/08/2016   Elevated prostate specific antigen (PSA) 06/18/2015   Benign essential HTN 06/18/2015   Chronic midline low back pain with bilateral sciatica 06/18/2015   Diabetic neuropathy ([place]) 06/18/2015   Posttraumatic stress disorder 06/18/2015   Dyslipidemia A999333   Lichen simplex [id number]   History of herpes zoster 06/18/2015   Restless leg 06/18/2015   OSA (obstructive sleep apnea) 06/18/2015   Polyneuropathy 06/18/2015   Idiopathic scoliosis and kyphoscoliosis 12/10/2013   Calculus of kidney 08/21/2013   History of pancreatitis 08/15/2013   Hydrocele 05/19/2013   Arthritis of knee, degenerative  10/30/2012   Lumbar canal stenosis 10/30/2012   Chronic prostatitis 06/19/2012   Hypogonadism in male 06/19/2012   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   Benign prostatic hyperplasia with urinary obstruction 06/19/2012    Past Surgical History:  Procedure Laterality Date   BACK SURGERY     x 2   CATARACT EXTRACTION W/PHACO Left 04/20/2020   Procedure: CATARACT EXTRACTION PHACO AND INTRAOCULAR LENS PLACEMENT (IOC) LEFT DIABETIC 4.37 00:48.7;  Surgeon: Birder Robson, MD;  Location: [facility];  Service: Ophthalmology;  Laterality: Left;   CATARACT EXTRACTION W/PHACO Right 05/18/2020   Procedure: CATARACT EXTRACTION PHACO AND INTRAOCULAR LENS PLACEMENT ([place]) RIGHT DIABETIC;  Surgeon: Birder Robson, MD;  Location: [facility];  Service: Ophthalmology;  Laterality: Right;  4.15 0:35.6   CHOLECYSTECTOMY N/A 11/15/2018   Procedure: LAPAROSCOPIC CHOLECYSTECTOMY;  Surgeon: Jules Husbands, MD;  Location: [facility];  Service: General;  Laterality: N/A;   CIRCUMCISION N/A    COLONOSCOPY     HERNIA REPAIR     Hiatal   HYDROCELE EXCISION / REPAIR Left        Home Medications    Prior to Admission medications   Medication Sig Start Date End Date Taking? Authorizing Provider  albuterol (VENTOLIN HFA) 108 (90 Base) MCG/ACT inhaler Inhale 2 puffs into the lungs every 6 (six) hours as needed for wheezing or  shortness of breath. 01/15/22   Lyndee Hensen, DO  Alogliptin Benzoate 25 MG TABS Take 25 mg by mouth daily.     [provider]  amLODipine (NORVASC) 10 MG tablet Take 10 mg by mouth daily.     [provider]  aspirin 81 MG tablet Take 81 mg by mouth daily.    [provider]  buPROPion (WELLBUTRIN XL) 150 MG 24 hr tablet Take 150 mg by mouth daily.    [provider]  carvedilol (COREG) 12.5 MG tablet Take 12.5 mg by mouth 2 (two) times daily with a meal.     [provider]  diclofenac sodium (VOLTAREN) 1  % GEL Apply 2 g topically every 6 (six) hours.    [provider]  DULoxetine (CYMBALTA) 60 MG capsule Take 60 mg by mouth daily.     [provider]  hydroxychloroquine (PLAQUENIL) 200 MG tablet Take 400 mg by mouth daily.     [provider]  ibuprofen (ADVIL,MOTRIN) 600 MG tablet Take 1 tablet (600 mg total) by mouth every 6 (six) hours as needed. Patient taking differently: Take 800 mg by mouth 2 (two) times daily as needed. 05/20/16   Melynda Ripple, MD  losartan (COZAAR) 100 MG tablet Take 100 mg by mouth daily.  06/19/12   [provider]  Melatonin 5 MG TABS Take 15 mg by mouth at bedtime.    [provider]  memantine (NAMENDA) 10 MG tablet Take 10 mg by mouth daily.    [provider]  metFORMIN (GLUCOPHAGE) 500 MG tablet Take 500 mg by mouth 2 (two) times daily with a meal.    [provider]  omeprazole (PRILOSEC) 20 MG capsule Take 20 mg by mouth daily.    [provider]  pravastatin (PRAVACHOL) 80 MG tablet Take 80 mg by mouth at bedtime.    [provider]  prazosin (MINIPRESS) 1 MG capsule Take 1 mg by mouth 3 (three) times daily.    [provider]  pregabalin (LYRICA) 300 MG capsule Take 300 mg by mouth 2 (two) times daily.    [provider]  sildenafil (REVATIO) 20 MG tablet Take 20 mg by mouth as needed.    [provider]  Spacer/Aero-Holding Chambers (AEROCHAMBER MV) inhaler Use as instructed 08/19/20   Margarette Canada, NP  tamsulosin (FLOMAX) 0.4 MG CAPS capsule Take 0.4 mg by mouth 2 (two) times a day.  10/21/12   [provider]  tiotropium (SPIRIVA) 18 MCG inhalation capsule Place 1 capsule (18 mcg total) into inhaler and inhale daily. 03/06/17   Laverle Hobby, MD  Tiotropium Bromide Monohydrate (SPIRIVA RESPIMAT) 1.25 MCG/ACT AERS Inhale into the lungs.    [provider]  insulin glargine (LANTUS) 100 UNIT/ML injection Inject 40 Units into  the skin daily.  Patient not taking: No sig reported 11/06/17 08/19/20  [provider]    Family History Family History  Problem Relation Age of Onset   Stroke Mother    Diabetes Father     Social History Social History   Tobacco Use   Smoking status: Former    Packs/day: 0.25    Years: 30.00    Additional pack years: 0.00    Total pack years: 7.50    Types: Cigarettes    Start date: 05/08/1968    Quit date: 06/17/1998    Years since quitting: 24.1   Smokeless tobacco: Never  Vaping Use   Vaping Use: Never used  Substance  Use Topics   Alcohol use: No    Alcohol/week: 0.0 standard drinks of alcohol   Drug use: No     Allergies   Banana, Codeine, Iodine, Peanut oil, Ace inhibitors, Banana extract allergy skin test, Gabapentin, Iodinated contrast media, Lisinopril, Metformin, and Peanut-containing drug products   Review of Systems Review of Systems   Physical Exam Triage Vital Signs ED Triage Vitals  Enc Vitals Group     BP 07/21/22 1103 132/73     Pulse Rate 07/21/22 1103 83     Resp 07/21/22 1103 20     Temp 07/21/22 1103 99.2 F (37.3 C)     Temp Source 07/21/22 1103 Oral     SpO2 07/21/22 1103 (!) 84 %     Weight 07/21/22 1102 218 lb (98.9 kg)     Height 07/21/22 1102 5\' 6"  (1.676 m)     Head Circumference --      Peak Flow --      Pain Score 07/21/22 1102 0     Pain Loc --      Pain Edu? --      Excl. in [place]? --    No data found.  Updated Vital Signs BP 132/73 (BP Location: Left Arm)   Pulse 83   Temp 99.2 F (37.3 C) (Oral)   Resp 20   Ht 5\' 6"  (1.676 m)   Wt 218 lb (98.9 kg)   SpO2 92%   BMI 35.19 kg/m   Visual Acuity Right Eye Distance:   Left Eye Distance:   Bilateral Distance:    Right Eye Near:   Left Eye Near:    Bilateral Near:     Physical Exam Constitutional:      General: He is not in acute distress.    Appearance: He is obese. He is ill-appearing. He is not toxic-appearing or diaphoretic.  HENT:     Head:  Normocephalic and atraumatic.     Nose: Nose normal.     Mouth/Throat:     Mouth: Mucous membranes are moist.  Eyes:     Pupils: Pupils are equal, round, and reactive to light.  Cardiovascular:     Rate and Rhythm: Normal rate and regular rhythm.     Pulses: Normal pulses.     Heart sounds: Normal heart sounds.  Pulmonary:     Breath sounds: Rhonchi present.  Abdominal:     Comments: Increased abdominal girth       Musculoskeletal:     Cervical back: Normal range of motion.     Right lower leg: Edema (trace) present.     Left lower leg: Edema (1+) present.  Skin:    Capillary Refill: Capillary refill takes less than 2 seconds.  Neurological:     General: No focal deficit present.     Mental Status: He is oriented to person, place, and time.    UC Treatments / Results  Labs (all labs ordered are listed, but only abnormal results are displayed) Labs Reviewed  CBC WITH DIFFERENTIAL/PLATELET - Abnormal; Notable for the following components:      Result Value   RBC 4.19 (*)    Hemoglobin 12.5 (*)    HCT 38.3 (*)    Platelets 124 (*)    All other components within normal limits  BASIC METABOLIC PANEL - Abnormal; Notable for the following components:   Glucose, Bld 212 (*)    Creatinine, Ser 1.28 (*)    Calcium 8.1 (*)    GFR, Estimated  59 (*)    All other components within normal limits  SARS CORONAVIRUS 2 BY RT PCR    EKG   Radiology DG Chest 2 View  Result Date: 07/21/2022 CLINICAL DATA:  Shortness of breath and crackles. Cough. Congestion. EXAM: CHEST - 2 VIEW COMPARISON:  Chest radiographs 01/15/2022 and 08/19/2020 FINDINGS: Cardiac silhouette is again mildly enlarged. Mediastinal contours are within limits. There is again mild hypoinflation. Left-greater-than-right bibasilar bronchovascular crowding. No definite focal airspace opacity to indicate pneumonia. Mild-to-moderate gaseous distention of the stomach is similar to most recent prior 01/15/2022. Mild  multilevel degenerative disc changes of the thoracic spine. IMPRESSION: Mild hypoinflation with bibasilar bronchovascular crowding. No definite pneumonia. Electronically Signed   By: Yvonne Kendall M.D.   On: 07/21/2022 11:52    Procedures Procedures (including critical care time)  Medications Ordered in UC Medications  ipratropium-albuterol (DUONEB) 0.5-2.5 (3) MG/3ML nebulizer solution 3 mL (has no administration in time range)    Initial Impression / Assessment and Plan / UC Course  I have reviewed the triage vital signs and the nursing notes.  Pertinent labs & imaging results that were available during my care of the patient were reviewed by me and considered in my medical decision making (see chart for details).     Cough and congestion Final Clinical Impressions(s) / UC Diagnoses   Final diagnoses:  Acute cough  Localized edema  Bronchial pneumonia  Mr. Chesley Noon 72 year old male in today with his wife for 1 week of cough and congestion with on and off fever and some wheezing.  He has a history of sleep apnea but denies COPD.  He did have a 60-month history of COVID with recurring pneumonia.  He reports that he does not feel like he is being treated long enough.  His wife reports that he does doze off at home significantly.   Discharge Instructions      Your COVID is pending. Your results will show in your MyChart. Any positive results will result in a phone call from a nurse with next steps in treatment and recommendations.   Overall your x-ray indicates bronchial crowding   We do encourage you to follow up with your primary care provider in one week to reevaluate symptoms.       ED Prescriptions   None    PDMP not reviewed this encounter.   Dionisio David [place], [place] 07/21/22 1537

## 2022-07-21 NOTE — Discharge Instructions (Addendum)
Overall your x-ray indicates bronchial crowding unable to rule out pneumonia.  Recommendation is for you to go to the emergency room to be evaluated with additional imaging.  Due to your oxygen saturation continuing consistently being below 90%. You were given DuoNeb which did show some improvement. Your CBC did not indicate any active infection.  Your COVID test was negative.  Your serum creatinine was 1.8 and blood glucose was greater than 200.  You have made a decision to go home.  I do want to provide you with treatment for bronchial pneumonia.  And again I recommend if your symptoms persist will worsen but should go straight to the nearest emergency room You have been prescribed doxycycline 100 mg twice daily for 10 days Advair 2 puffs twice daily and benzonatate 100 mg every 8 hours as needed for cough

## 2022-07-21 NOTE — ED Notes (Signed)
Pt walked for 90 seconds and had a o2 of 88%. Pt verbalized understanding of concerns and signed an AMA form.

## 2022-07-22 ENCOUNTER — Emergency Department: Payer: No Typology Code available for payment source

## 2022-07-22 ENCOUNTER — Inpatient Hospital Stay
Admission: EM | Admit: 2022-07-22 | Discharge: 2022-07-27 | DRG: 291 | Disposition: A | Discharge status: Home / Self Care | Payer: No Typology Code available for payment source | Attending: Internal Medicine | Admitting: Internal Medicine

## 2022-07-22 ENCOUNTER — Other Ambulatory Visit: Payer: Self-pay

## 2022-07-22 ENCOUNTER — Encounter: Payer: Self-pay | Admitting: Intensive Care

## 2022-07-22 DIAGNOSIS — Z1152 Encounter for screening for COVID-19: Secondary | ICD-10-CM

## 2022-07-22 DIAGNOSIS — N179 Acute kidney failure, unspecified: Secondary | ICD-10-CM | POA: Diagnosis not present

## 2022-07-22 DIAGNOSIS — Z833 Family history of diabetes mellitus: Secondary | ICD-10-CM

## 2022-07-22 DIAGNOSIS — E876 Hypokalemia: Secondary | ICD-10-CM | POA: Diagnosis present

## 2022-07-22 DIAGNOSIS — F4310 Post-traumatic stress disorder, unspecified: Secondary | ICD-10-CM | POA: Diagnosis present

## 2022-07-22 DIAGNOSIS — Z7951 Long term (current) use of inhaled steroids: Secondary | ICD-10-CM

## 2022-07-22 DIAGNOSIS — Z888 Allergy status to other drugs, medicaments and biological substances status: Secondary | ICD-10-CM

## 2022-07-22 DIAGNOSIS — F02A0 Dementia in other diseases classified elsewhere, mild, without behavioral disturbance, psychotic disturbance, mood disturbance, and anxiety: Secondary | ICD-10-CM

## 2022-07-22 DIAGNOSIS — G4733 Obstructive sleep apnea (adult) (pediatric): Secondary | ICD-10-CM | POA: Diagnosis not present

## 2022-07-22 DIAGNOSIS — Z87891 Personal history of nicotine dependence: Secondary | ICD-10-CM

## 2022-07-22 DIAGNOSIS — Z91010 Allergy to peanuts: Secondary | ICD-10-CM | POA: Diagnosis not present

## 2022-07-22 DIAGNOSIS — G3183 Dementia with Lewy bodies: Secondary | ICD-10-CM

## 2022-07-22 DIAGNOSIS — E7800 Pure hypercholesterolemia, unspecified: Secondary | ICD-10-CM | POA: Diagnosis present

## 2022-07-22 DIAGNOSIS — F02A11 Dementia in other diseases classified elsewhere, mild, with agitation: Secondary | ICD-10-CM | POA: Diagnosis present

## 2022-07-22 DIAGNOSIS — I5033 Acute on chronic diastolic (congestive) heart failure: Secondary | ICD-10-CM | POA: Diagnosis present

## 2022-07-22 DIAGNOSIS — Z79899 Other long term (current) drug therapy: Secondary | ICD-10-CM

## 2022-07-22 DIAGNOSIS — Z7984 Long term (current) use of oral hypoglycemic drugs: Secondary | ICD-10-CM

## 2022-07-22 DIAGNOSIS — I10 Essential (primary) hypertension: Secondary | ICD-10-CM | POA: Diagnosis not present

## 2022-07-22 DIAGNOSIS — E1142 Type 2 diabetes mellitus with diabetic polyneuropathy: Secondary | ICD-10-CM

## 2022-07-22 DIAGNOSIS — I110 Hypertensive heart disease with heart failure: Secondary | ICD-10-CM | POA: Diagnosis present

## 2022-07-22 DIAGNOSIS — M069 Rheumatoid arthritis, unspecified: Secondary | ICD-10-CM | POA: Diagnosis present

## 2022-07-22 DIAGNOSIS — Z91018 Allergy to other foods: Secondary | ICD-10-CM | POA: Diagnosis not present

## 2022-07-22 DIAGNOSIS — J9601 Acute respiratory failure with hypoxia: Secondary | ICD-10-CM

## 2022-07-22 DIAGNOSIS — T502X5A Adverse effect of carbonic-anhydrase inhibitors, benzothiadiazides and other diuretics, initial encounter: Secondary | ICD-10-CM | POA: Diagnosis present

## 2022-07-22 DIAGNOSIS — Z885 Allergy status to narcotic agent status: Secondary | ICD-10-CM

## 2022-07-22 DIAGNOSIS — Z794 Long term (current) use of insulin: Secondary | ICD-10-CM | POA: Diagnosis not present

## 2022-07-22 DIAGNOSIS — I509 Heart failure, unspecified: Secondary | ICD-10-CM | POA: Diagnosis not present

## 2022-07-22 DIAGNOSIS — E1165 Type 2 diabetes mellitus with hyperglycemia: Secondary | ICD-10-CM | POA: Diagnosis present

## 2022-07-22 DIAGNOSIS — Z7982 Long term (current) use of aspirin: Secondary | ICD-10-CM

## 2022-07-22 DIAGNOSIS — F339 Major depressive disorder, recurrent, unspecified: Secondary | ICD-10-CM | POA: Insufficient documentation

## 2022-07-22 DIAGNOSIS — F03A0 Unspecified dementia, mild, without behavioral disturbance, psychotic disturbance, mood disturbance, and anxiety: Secondary | ICD-10-CM | POA: Diagnosis present

## 2022-07-22 DIAGNOSIS — F02A18 Dementia in other diseases classified elsewhere, mild, with other behavioral disturbance: Secondary | ICD-10-CM | POA: Diagnosis present

## 2022-07-22 DIAGNOSIS — Z91041 Radiographic dye allergy status: Secondary | ICD-10-CM | POA: Diagnosis not present

## 2022-07-22 DIAGNOSIS — E1140 Type 2 diabetes mellitus with diabetic neuropathy, unspecified: Secondary | ICD-10-CM | POA: Diagnosis present

## 2022-07-22 DIAGNOSIS — R918 Other nonspecific abnormal finding of lung field: Secondary | ICD-10-CM | POA: Diagnosis present

## 2022-07-22 DIAGNOSIS — I5031 Acute diastolic (congestive) heart failure: Secondary | ICD-10-CM | POA: Diagnosis not present

## 2022-07-22 DIAGNOSIS — Z823 Family history of stroke: Secondary | ICD-10-CM

## 2022-07-22 DIAGNOSIS — E119 Type 2 diabetes mellitus without complications: Secondary | ICD-10-CM

## 2022-07-22 LAB — BRAIN NATRIURETIC PEPTIDE: B Natriuretic Peptide: 43 pg/mL (ref 0.0–100.0)

## 2022-07-22 LAB — CBC WITH DIFFERENTIAL/PLATELET
Abs Immature Granulocytes: 0.01 10*3/uL (ref 0.00–0.07)
Basophils Absolute: 0 10*3/uL (ref 0.0–0.1)
Basophils Relative: 0 %
Eosinophils Absolute: 0 10*3/uL (ref 0.0–0.5)
Eosinophils Relative: 1 %
HCT: 41.7 % (ref 39.0–52.0)
Hemoglobin: 13.3 g/dL (ref 13.0–17.0)
Immature Granulocytes: 0 %
Lymphocytes Relative: 24 %
Lymphs Abs: 0.9 10*3/uL (ref 0.7–4.0)
MCH: 29.8 pg (ref 26.0–34.0)
MCHC: 31.9 g/dL (ref 30.0–36.0)
MCV: 93.5 fL (ref 80.0–100.0)
Monocytes Absolute: 0.3 10*3/uL (ref 0.1–1.0)
Monocytes Relative: 8 %
Neutro Abs: 2.6 10*3/uL (ref 1.7–7.7)
Neutrophils Relative %: 67 %
Platelets: 139 10*3/uL — ABNORMAL LOW (ref 150–400)
RBC: 4.46 MIL/uL (ref 4.22–5.81)
RDW: 13.9 % (ref 11.5–15.5)
WBC: 4 10*3/uL (ref 4.0–10.5)
nRBC: 0 % (ref 0.0–0.2)

## 2022-07-22 LAB — BLOOD GAS, VENOUS
Acid-Base Excess: 1 mmol/L (ref 0.0–2.0)
Bicarbonate: 27.9 mmol/L (ref 20.0–28.0)
O2 Saturation: 58.5 %
Patient temperature: 37
pCO2, Ven: 53 mmHg (ref 44–60)
pH, Ven: 7.33 (ref 7.25–7.43)
pO2, Ven: 38 mmHg (ref 32–45)

## 2022-07-22 LAB — COMPREHENSIVE METABOLIC PANEL
ALT: 24 U/L (ref 0–44)
AST: 33 U/L (ref 15–41)
Albumin: 4 g/dL (ref 3.5–5.0)
Alkaline Phosphatase: 53 U/L (ref 38–126)
Anion gap: 7 (ref 5–15)
BUN: 21 mg/dL (ref 8–23)
CO2: 23 mmol/L (ref 22–32)
Calcium: 8.4 mg/dL — ABNORMAL LOW (ref 8.9–10.3)
Chloride: 104 mmol/L (ref 98–111)
Creatinine, Ser: 1.18 mg/dL (ref 0.61–1.24)
GFR, Estimated: >60 mL/min (ref >60)
Glucose, Bld: 315 mg/dL — ABNORMAL HIGH (ref 70–99)
Potassium: 3.9 mmol/L (ref 3.5–5.1)
Sodium: 134 mmol/L — ABNORMAL LOW (ref 135–145)
Total Bilirubin: 0.7 mg/dL (ref 0.3–1.2)
Total Protein: 8 g/dL (ref 6.5–8.1)

## 2022-07-22 LAB — RESP PANEL BY RT-PCR (RSV, FLU A&B, COVID)  RVPGX2
Influenza A by PCR: NEGATIVE
Influenza B by PCR: NEGATIVE
Resp Syncytial Virus by PCR: NEGATIVE
SARS Coronavirus 2 by RT PCR: NEGATIVE

## 2022-07-22 LAB — LACTIC ACID, PLASMA
Lactic Acid, Venous: 2.4 mmol/L (ref 0.5–1.9)
Lactic Acid, Venous: 1.5 mmol/L (ref 0.5–1.9)

## 2022-07-22 LAB — TROPONIN I (HIGH SENSITIVITY)
Troponin I (High Sensitivity): 8 ng/L (ref <18)
Troponin I (High Sensitivity): 7 ng/L (ref <18)

## 2022-07-22 LAB — PROCALCITONIN: Procalcitonin: <0.1 ng/mL

## 2022-07-22 LAB — GLUCOSE, CAPILLARY
Glucose-Capillary: 210 mg/dL — ABNORMAL HIGH (ref 70–99)
Glucose-Capillary: 208 mg/dL — ABNORMAL HIGH (ref 70–99)

## 2022-07-22 MED ORDER — FUROSEMIDE 10 MG/ML IJ SOLN: 40.0000 mg | Freq: Two times a day (BID) | INTRAMUSCULAR | Status: DC

## 2022-07-22 MED ORDER — FUROSEMIDE 10 MG/ML IJ SOLN
40.0000 mg | Freq: Two times a day (BID) | INTRAMUSCULAR | Status: DC
  Administered 2022-07-22 – 2022-07-25 (×6): 40 mg via INTRAVENOUS
  Filled 2022-07-22 (×6): qty 4

## 2022-07-22 MED ORDER — ENOXAPARIN SODIUM 60 MG/0.6ML IJ SOSY
50.0000 mg | PREFILLED_SYRINGE | INTRAMUSCULAR | Status: DC
  Administered 2022-07-22 – 2022-07-26 (×5): 50 mg via SUBCUTANEOUS
  Filled 2022-07-22 (×5): qty 0.6

## 2022-07-22 MED ORDER — SODIUM CHLORIDE 0.9 % IV SOLN: 250.0000 mL | INTRAVENOUS | Status: DC | PRN

## 2022-07-22 MED ORDER — ACETAMINOPHEN 325 MG PO TABS: 650.0000 mg | ORAL_TABLET | ORAL | Status: DC | PRN

## 2022-07-22 MED ORDER — FUROSEMIDE 10 MG/ML IJ SOLN
40.0000 mg | Freq: Once | INTRAMUSCULAR | Status: AC
  Administered 2022-07-22: 40 mg via INTRAVENOUS
  Filled 2022-07-22: qty 4

## 2022-07-22 MED ORDER — INSULIN ASPART 100 UNIT/ML IJ SOLN
0.0000 [IU] | Freq: Three times a day (TID) | INTRAMUSCULAR | Status: DC
  Administered 2022-07-22: 5 [IU] via SUBCUTANEOUS
  Administered 2022-07-23 (×3): 3 [IU] via SUBCUTANEOUS
  Administered 2022-07-24: 5 [IU] via SUBCUTANEOUS
  Administered 2022-07-24: 3 [IU] via SUBCUTANEOUS
  Administered 2022-07-24 – 2022-07-25 (×3): 5 [IU] via SUBCUTANEOUS
  Administered 2022-07-25 – 2022-07-26 (×3): 3 [IU] via SUBCUTANEOUS
  Administered 2022-07-26: 8 [IU] via SUBCUTANEOUS
  Administered 2022-07-27 (×2): 3 [IU] via SUBCUTANEOUS
  Filled 2022-07-22 (×15): qty 1

## 2022-07-22 MED ORDER — IPRATROPIUM-ALBUTEROL 0.5-2.5 (3) MG/3ML IN SOLN
3.0000 mL | Freq: Once | RESPIRATORY_TRACT | Status: AC
  Administered 2022-07-22: 3 mL via RESPIRATORY_TRACT
  Filled 2022-07-22: qty 3

## 2022-07-22 MED ORDER — SODIUM CHLORIDE 0.9% FLUSH
3.0000 mL | Freq: Two times a day (BID) | INTRAVENOUS | Status: DC
  Administered 2022-07-22 – 2022-07-26 (×10): 3 mL via INTRAVENOUS

## 2022-07-22 MED ORDER — ONDANSETRON HCL 4 MG/2ML IJ SOLN
4.0000 mg | Freq: Four times a day (QID) | INTRAMUSCULAR | Status: DC | PRN
  Administered 2022-07-25: 4 mg via INTRAVENOUS
  Filled 2022-07-22: qty 2

## 2022-07-22 MED ORDER — SODIUM CHLORIDE 0.9 % IV SOLN
500.0000 mg | Freq: Once | INTRAVENOUS | Status: AC
  Administered 2022-07-22: 500 mg via INTRAVENOUS
  Filled 2022-07-22: qty 5

## 2022-07-22 MED ORDER — SODIUM CHLORIDE 0.9 % IV SOLN
2.0000 g | Freq: Once | INTRAVENOUS | Status: AC
  Administered 2022-07-22: 2 g via INTRAVENOUS
  Filled 2022-07-22: qty 20

## 2022-07-22 MED ORDER — SODIUM CHLORIDE 0.9% FLUSH: 3.0000 mL | INTRAVENOUS | Status: DC | PRN

## 2022-07-22 NOTE — Assessment & Plan Note (Signed)
Recently diagnosed with Lewy body dementia.  It seems mild in nature, he is oriented x 3.  Nurses report intermittent agitation & restlessness. --Delirium precautions --Haldol PRN agitation

## 2022-07-22 NOTE — ED Notes (Signed)
pt visitor informed staff that pt was trying had chick fila at bedside and didnt think pt should be eating it given diagnosis and equpitment being used requested if a nurse could step in and confiscate it and explain to pt his eating precautions. RN stepped in and expalined to pt and visitor his eating restrctions and why they were in place.

## 2022-07-22 NOTE — Assessment & Plan Note (Signed)
Presented with gradually worsening shortness of breath, orthopnea, lower extremity swelling and abdominal distention with chest x-ray notable for opacities in the right middle and lower lobe and bilateral pleural effusions.   - Mgmt of heart failure as noted below - Required BiPAP for increased WOB - Weaned to room air with stable O2 sats --Monitor, supplement O2 PRN

## 2022-07-22 NOTE — Assessment & Plan Note (Signed)
-   Holding home antiglycemic agents - Sliding scale Novolog, moderate --Resume home regimen at d/c. --Outpatient follow up

## 2022-07-22 NOTE — Assessment & Plan Note (Addendum)
Patient endorses symptoms of SOB, orthopnea, lower extremity swelling and abdominal distention with evidence of hypervolemia consistent with heart failure. BNP is interestingly normal but this may be secondary to obesity. No prior history of CAD or CHF, but extensive history of HTN.  07/24/22 - Echo -- EF 50-55%, grade I DD, no significant valvular disease --Status post IV Lasix with excellent clinical response. Net IO Since Admission: -6,858.9 mL [07/26/22 1334] - Telemetry monitoring - Holding diuresis & giving gentle fluids, seems he was overdiuresed - Daily weights & strict I/O's - Continue Coreg, losartan

## 2022-07-22 NOTE — H&P (Signed)
History and Physical    Patient: Ian Lucas [id number] DOB: [birth year] DOA: 07/22/2022 DOS: the patient was seen and examined on 07/22/2022 PCP: Steele Sizer, MD  Patient coming from: Home  Chief Complaint:  Chief Complaint  Patient presents with   Shortness of Breath   HPI: Ian Lucas is a 72 y.o. male with medical history significant of Type 2 diabetes, rheumatoid arthritis, hypertension, hyperlipidemia, OSA on CPAP, Lewy body dementia, who presents to the ED due to shortness of breath.  [name] states that for the last year, he has been experiencing gradually worsening shortness of breath that has gotten significantly worse over the last several days.  In addition, he has noticed lower extremity swelling that has been present for at least several weeks and orthopnea.  His family has noticed that his abdomen seems more distended.  He denies any recent fever, chills, nausea, vomiting, diarrhea or abdominal pain.  He denies any chest pain or palpitations.  He denies any known history of CAD or CHF.  He endorses a history of wheezing but states that his PCP at the [place] did a workup for COPD and was told it was negative.  ED course: On arrival to the ED, patient was hypertensive at 143/73 with a heart rate of 75.  He was saturating at 95% on 6 L close was placed on BiPAP due to increased work of breathing.  He was afebrile at 98.1. Initial workup notable for WBC of 4.0, platelets of 139,Sodium of 134, glucose 315, creatinine 1.18, and GFRAbove 60.  BeingP- at 43Troponin of 80.  Lactic acid mildly elevated 2.4 toProcalcitonin negative at less than 0.10.  COVID19, InfluenzaAnd RSV PCR negative.Chest x-ray was obtained that demonstrated patchy opacities in the right mid and lower lung concerning for pneumonia with bilateral pleural effusions.  Patient started on CAP coverage and Lasix due to concern for heart failure exacerbation.  TRH contacted for admission.  Review of Systems: As  mentioned in the history of present illness. All other systems reviewed and are negative.  Past Medical History:  Diagnosis Date   Arthritis    Rheumatoid   Complication of anesthesia    has become combative following surgery   Diabetes mellitus without complication ([place])    Type 2   History of kidney stones    x 2   History of shingles    Hypercholesteremia    Hypertension    Neuropathy    Pancreatitis    PTSD (post-traumatic stress disorder)    Sleep apnea    CPAP machine   Vertigo    occasional   Past Surgical History:  Procedure Laterality Date   BACK SURGERY     x 2   CATARACT EXTRACTION W/PHACO Left 04/20/2020   Procedure: CATARACT EXTRACTION PHACO AND INTRAOCULAR LENS PLACEMENT (IOC) LEFT DIABETIC 4.37 00:48.7;  Surgeon: Birder Robson, MD;  Location: [facility];  Service: Ophthalmology;  Laterality: Left;   CATARACT EXTRACTION W/PHACO Right 05/18/2020   Procedure: CATARACT EXTRACTION PHACO AND INTRAOCULAR LENS PLACEMENT ([place]) RIGHT DIABETIC;  Surgeon: Birder Robson, MD;  Location: [facility];  Service: Ophthalmology;  Laterality: Right;  4.15 0:35.6   CHOLECYSTECTOMY N/A 11/15/2018   Procedure: LAPAROSCOPIC CHOLECYSTECTOMY;  Surgeon: Jules Husbands, MD;  Location: [facility];  Service: General;  Laterality: N/A;   CIRCUMCISION N/A    COLONOSCOPY     HERNIA REPAIR     [place]   HYDROCELE EXCISION / REPAIR Left    Social History:  reports that he quit smoking about 24 years ago. His smoking use included cigarettes. He started smoking about 54 years ago. He has a 7.50 pack-year smoking history. He has never used smokeless tobacco. He reports that he does not drink alcohol and does not use drugs.  Allergies  Allergen Reactions   Banana Shortness Of Breath   Codeine Itching   Iodine Swelling    Pt states 30-40 years ago when he had x-ray dye his lips swelled and he threw up. SPM   Peanut Oil Shortness Of Breath   Ace Inhibitors Cough   Banana  Extract Allergy Skin Test Other (See Comments)   Gabapentin Other (See Comments)    Anxious and jumpy    Iodinated Contrast Media Other (See Comments)   Lisinopril Cough   Metformin Diarrhea   Peanut-Containing Drug Products     Family History  Problem Relation Age of Onset   Stroke Mother    Diabetes Father     Prior to Admission medications   Medication Sig Start Date End Date Taking? Authorizing Provider  albuterol (VENTOLIN HFA) 108 (90 Base) MCG/ACT inhaler Inhale 2 puffs into the lungs every 6 (six) hours as needed for wheezing or shortness of breath. 01/15/22   Lyndee Hensen, DO  Alogliptin Benzoate 25 MG TABS Take 25 mg by mouth daily.     [provider]  amLODipine (NORVASC) 10 MG tablet Take 10 mg by mouth daily.     [provider]  aspirin 81 MG tablet Take 81 mg by mouth daily.    [provider]  benzonatate (TESSALON) 100 MG capsule Take 1 capsule (100 mg total) by mouth 3 (three) times daily as needed for up to 10 days for cough. Never suck or chew on a benzonatate capsule. 07/21/22 07/31/22  Vevelyn Francois, NP  buPROPion (WELLBUTRIN XL) 150 MG 24 hr tablet Take 150 mg by mouth daily.    [provider]  carvedilol (COREG) 12.5 MG tablet Take 12.5 mg by mouth 2 (two) times daily with a meal.     [provider]  diclofenac sodium (VOLTAREN) 1 % GEL Apply 2 g topically every 6 (six) hours.    [provider]  doxycycline (VIBRAMYCIN) 100 MG capsule Take 1 capsule (100 mg total) by mouth 2 (two) times daily for 10 days. 07/21/22 07/31/22  Vevelyn Francois, NP  DULoxetine (CYMBALTA) 60 MG capsule Take 60 mg by mouth daily.     [provider]  fluticasone-salmeterol (ADVAIR HFA) 115-21 MCG/ACT inhaler Inhale 2 puffs into the lungs 2 (two) times daily. 07/21/22   Vevelyn Francois, NP  hydroxychloroquine (PLAQUENIL) 200 MG tablet Take 400 mg by mouth daily.     [provider]  ibuprofen (ADVIL,MOTRIN) 600 MG  tablet Take 1 tablet (600 mg total) by mouth every 6 (six) hours as needed. Patient taking differently: Take 800 mg by mouth 2 (two) times daily as needed. 05/20/16   Melynda Ripple, MD  losartan (COZAAR) 100 MG tablet Take 100 mg by mouth daily.  06/19/12   [provider]  Melatonin 5 MG TABS Take 15 mg by mouth at bedtime.    [provider]  memantine (NAMENDA) 10 MG tablet Take 10 mg by mouth daily.    [provider]  metFORMIN (GLUCOPHAGE) 500 MG tablet Take 500 mg by mouth 2 (two) times daily with a meal.    [provider]  omeprazole (PRILOSEC) 20 MG capsule Take 20 mg  by mouth daily.    [provider]  pravastatin (PRAVACHOL) 80 MG tablet Take 80 mg by mouth at bedtime.    [provider]  prazosin (MINIPRESS) 1 MG capsule Take 1 mg by mouth 3 (three) times daily.    [provider]  pregabalin (LYRICA) 300 MG capsule Take 300 mg by mouth 2 (two) times daily.    [provider]  sildenafil (REVATIO) 20 MG tablet Take 20 mg by mouth as needed.    [provider]  Spacer/Aero-Holding Chambers (AEROCHAMBER MV) inhaler Use as instructed 08/19/20   Margarette Canada, NP  tamsulosin (FLOMAX) 0.4 MG CAPS capsule Take 0.4 mg by mouth 2 (two) times a day.  10/21/12   [provider]  tiotropium (SPIRIVA) 18 MCG inhalation capsule Place 1 capsule (18 mcg total) into inhaler and inhale daily. 03/06/17   Laverle Hobby, MD  Tiotropium Bromide Monohydrate (SPIRIVA RESPIMAT) 1.25 MCG/ACT AERS Inhale into the lungs.    [provider]  insulin glargine (LANTUS) 100 UNIT/ML injection Inject 40 Units into the skin daily.  Patient not taking: No sig reported 11/06/17 08/19/20  [provider]    Physical Exam: Vitals:   07/22/22 1200 07/22/22 1230 07/22/22 1300 07/22/22 1307  BP: (!) 143/73 134/78 110/65   Pulse: 74 71 72   Resp: (!) 23 (!) 22 19   Temp:      TempSrc:      SpO2: 95% 95% 94%  94%  Weight:      Height:       Physical Exam Vitals and nursing note reviewed.  Constitutional:      General: He is not in acute distress.    Appearance: He is obese.  Eyes:     Extraocular Movements: Extraocular movements intact.     Pupils: Pupils are equal, round, and reactive to light.  Neck:     Comments: Unable to assess for JVD Cardiovascular:     Rate and Rhythm: Normal rate and regular rhythm.     Heart sounds: No murmur heard.    Comments: Pitting edema extending into thighs Pulmonary:     Effort: Pulmonary effort is normal. No accessory muscle usage or respiratory distress.     Breath sounds: Wheezing (Expiratory wheezing heard in bilateral middle and lower lobes) and rhonchi (Bibasilar) present. No rales.  Abdominal:     General: Bowel sounds are normal. There is distension.     Tenderness: There is no abdominal tenderness. There is no guarding.  Musculoskeletal:     Cervical back: Neck supple.     Right lower leg: 2+ Pitting Edema present.     Left lower leg: 2+ Pitting Edema present.  Skin:    General: Skin is warm and dry.  Neurological:     General: No focal deficit present.     Mental Status: He is alert and oriented to person, place, and time.  Psychiatric:        Mood and Affect: Mood normal.        Behavior: Behavior normal.    Data Reviewed: CBC with WBC of 4.0, hemoglobin of 13.6, and platelets of 139 CMP with sodium of 134, potassium 3.9, bicarb 23, glucose 315, creatinine 1.18, anion gap 7, AST 33, ALT 24 and GFR above 60 BNP within normal limits at 43 Troponin negative at 8 Lactic acid elevated 2.4 Procalcitonin negative at less than 0.10 COVID-19, RSV and influenza PCR negative  EKG personally reviewed.  Sinus rhythm with rate  of 74.  Borderline PR prolongation consistent with first-degree AV block.  No ST or T wave changes consistent with acute ischemia. DG Chest Portable 1 View  Result Date: 07/22/2022 CLINICAL DATA:  Patient complains of  audible wheezing and shortness of breath. EXAM: PORTABLE CHEST 1 VIEW COMPARISON:  07/21/2022 FINDINGS: Mild cardiac enlargement. Lung volumes are low. Blunting of the costophrenic angles are noted bilaterally. Since the prior exam there is been interval development of new patchy opacities in the right mid and right lower lung. No signs of interstitial edema. IMPRESSION: 1. New patchy opacities in the right mid and right lower lung compatible with pneumonia. 2. Possible small bilateral pleural effusions. Electronically Signed   By: Kerby Moors M.D.   On: 07/22/2022 12:11    Results are pending, will review when available.  Assessment and Plan:  * Acute hypoxic respiratory failure ([place]) Patient is coming in today with 1 year history of gradually worsening shortness of breath, orthopnea, lower extremity swelling and abdominal distention with chest x-ray notable for opacities in the right middle and lower lobe and bilateral pleural effusions.  Initial concern was for pneumonia, however WBC is normal, patient is afebrile and procalcitonin is negative.  Given longevity of symptoms, inconsistent with infectious etiology.  - Management of heart failure as noted below - Continue BiPAP - Wean as tolerated to nasal cannula  Acute heart failure ([place]) Patient endorses symptoms of SOB, orthopnea, lower extremity swelling and abdominal distention with evidence of hypervolemia consistent with heart failure. BNP is interestingly normal but this may be secondary to obesity. No prior history of CAD or CHF, but extensive history of HTN.   - Telemetry monitoring - Start Lasix 40 mg IV twice daily - Echocardiogram ordered - Daily weights - Strict in and out - Hold home ARB in the setting of diuresis - Continue home carvedilol given he has been on this chronically  Benign essential HTN - Hold home losartan in the setting of diuresis - Decrease dose of amlodipine given significant lower extremity edema -  Continue home carvedilol  Type 2 diabetes mellitus ([place]) - Hold home antiglycemic agents - A1c pending - SSI, moderate  Mild dementia ([place]) Patient was recently diagnosed with Lewy body dementia.  It is mild in nature and he is oriented x 3 at this time.  OSA (obstructive sleep apnea) - Continue CPAP at bedtime  Advance Care Planning:   Code Status: Full Code verified by patient  Consults: None  Family Communication: Patient's daughter-in-law updated at bedside  Severity of Illness: The appropriate patient status for this patient is INPATIENT. Inpatient status is judged to be reasonable and necessary in order to provide the required intensity of service to ensure the patient's safety. The patient's presenting symptoms, physical exam findings, and initial radiographic and laboratory data in the context of their chronic comorbidities is felt to place them at high risk for further clinical deterioration. Furthermore, it is not anticipated that the patient will be medically stable for discharge from the hospital within 2 midnights of admission.   * I certify that at the point of admission it is my clinical judgment that the patient will require inpatient hospital care spanning beyond 2 midnights from the point of admission due to high intensity of service, high risk for further deterioration and high frequency of surveillance required.*  Author: Jose Persia, MD 07/22/2022 2:55 PM  For on call review [email or web address].

## 2022-07-22 NOTE — ED Provider Notes (Signed)
[facility] Provider Note    Event Date/Time   First MD Initiated Contact with Patient 07/22/22 1130     (approximate)   History   Shortness of Breath   HPI  Ian Lucas is a 72 y.o. male  here with fatigue, SOB, cough. Pt reports that he has had increasing fatigue, drowsiness, cough, and SOB x 1-2 weeks. He was seen at [facility] for this yesterday, dx with possible bronchitis and sent home. He has had increasing LE edema as well and abd distension. This has been ongoing for several weeks though acutely worse in last week. He denies known h/o CHF. He has had cough with yellow-brown sputum production, no blood. No chest pain.       Physical Exam   Triage Vital Signs: ED Triage Vitals  Enc Vitals Group     BP 07/22/22 1138 (!) 147/74     Pulse Rate 07/22/22 1138 77     Resp 07/22/22 1138 (!) 26     Temp 07/22/22 1138 98.1 F (36.7 C)     Temp Source 07/22/22 1138 Oral     SpO2 07/22/22 1138 (!) 76 %     Weight 07/22/22 1133 218 lb (98.9 kg)     Height 07/22/22 1133 5\' 6"  (1.676 m)     Head Circumference --      Peak Flow --      Pain Score 07/22/22 1133 0     Pain Loc --      Pain Edu? --      Excl. in [place]? --     Most recent vital signs: Vitals:   07/22/22 1300 07/22/22 1307  BP: 110/65   Pulse: 72   Resp: 19   Temp:    SpO2: 94% 94%     General: Awake, no distress.  CV:  Good peripheral perfusion. RRR. Resp:  Tachypnea, diminished aeration with bilateral wheezes.  Abd:  Mild distension, no overt tenderness. Other:  2+ edema bilateral LE   ED Results / Procedures / Treatments   Labs (all labs ordered are listed, but only abnormal results are displayed) Labs Reviewed  CBC WITH DIFFERENTIAL/PLATELET - Abnormal; Notable for the following components:      Result Value   Platelets 139 (*)    All other components within normal limits  COMPREHENSIVE METABOLIC PANEL - Abnormal; Notable for the following components:   Sodium 134 (*)     Glucose, Bld 315 (*)    Calcium 8.4 (*)    All other components within normal limits  LACTIC ACID, PLASMA - Abnormal; Notable for the following components:   Lactic Acid, Venous 2.4 (*)    All other components within normal limits  RESP PANEL BY RT-PCR (RSV, FLU A&B, COVID)  RVPGX2  CULTURE, BLOOD (ROUTINE X 2)  CULTURE, BLOOD (ROUTINE X 2)  BRAIN NATRIURETIC PEPTIDE  PROCALCITONIN  BLOOD GAS, VENOUS  LACTIC ACID, PLASMA  HEMOGLOBIN A1C  TROPONIN I (HIGH SENSITIVITY)  TROPONIN I (HIGH SENSITIVITY)     EKG Normal sinus rhythm, VR 74. PR 217, QRS 98, QTc 431. No acute ST elevations or depressions. No ischemia or infarct.   RADIOLOGY CXR: Patchy opacities b/l lungs concerning for PNA, small b/l pleural effusions   I also independently reviewed and agree with radiologist interpretations.   PROCEDURES:  Critical Care performed: Yes, see critical care procedure note(s)  .Critical Care  Performed by: Duffy Bruce, MD Authorized by: Duffy Bruce, MD   Critical care  provider statement:    Critical care time (minutes):  30   Critical care time was exclusive of:  Separately billable procedures and treating other patients   Critical care was necessary to treat or prevent imminent or life-threatening deterioration of the following conditions:  Circulatory failure, cardiac failure, respiratory failure and sepsis   Critical care was time spent personally by me on the following activities:  Development of treatment plan with patient or surrogate, discussions with consultants, evaluation of patient's response to treatment, examination of patient, ordering and review of laboratory studies, ordering and review of radiographic studies, ordering and performing treatments and interventions, pulse oximetry, re-evaluation of patient's condition and review of old [place] ED: Medications  sodium chloride flush (NS) 0.9 % injection 3 mL (has no administration  in time range)  sodium chloride flush (NS) 0.9 % injection 3 mL (has no administration in time range)  0.9 %  sodium chloride infusion (has no administration in time range)  acetaminophen (TYLENOL) tablet 650 mg (has no administration in time range)  ondansetron (ZOFRAN) injection 4 mg (has no administration in time range)  enoxaparin (LOVENOX) injection 50 mg (has no administration in time range)  furosemide (LASIX) injection 40 mg (has no administration in time range)  insulin aspart (novoLOG) injection 0-15 Units (has no administration in time range)  furosemide (LASIX) injection 40 mg (40 mg Intravenous Given 07/22/22 1218)  cefTRIAXone (ROCEPHIN) 2 g in sodium chloride 0.9 % 100 mL IVPB (0 g Intravenous Stopped 07/22/22 1426)  azithromycin (ZITHROMAX) 500 mg in sodium chloride 0.9 % 250 mL IVPB (0 mg Intravenous Stopped 07/22/22 1426)  ipratropium-albuterol (DUONEB) 0.5-2.5 (3) MG/3ML nebulizer solution 3 mL (3 mLs Nebulization Given 07/22/22 1307)     IMPRESSION / MDM / [place] / ED COURSE  I reviewed the triage vital signs and the nursing notes.                              Differential diagnosis includes, but is not limited to, CHF, COPD, PNA, anemia, palpitations/arrhythmia.  Patient's presentation is most consistent with acute presentation with potential threat to life or bodily function.  The patient is on the cardiac monitor to evaluate for evidence of arrhythmia and/or significant heart rate changes  72 yo M with PMHx T2DM, RA, HTN, HLD, OSA, here with SOB. Clinically, high suspicion for CHF. He has edema, orthopnea, abd distension, progressive sx. Ddx includes PNA and CXR read as possible PNA, though this could be asymmetric edema. ABX given but clinically feel pt would benefit most from diuretics. No leukocytosis and procal negative which argues against infection. Will admit to medicine.    FINAL CLINICAL IMPRESSION(S) / ED DIAGNOSES   Final diagnoses:  Acute  respiratory failure with hypoxemia (HCC)  Congestive heart failure, unspecified HF chronicity, unspecified heart failure type ([place])     Rx / DC Orders   ED Discharge Orders     None        Note:  This document was prepared using Dragon voice recognition software and may include unintentional dictation errors.   Duffy Bruce, MD 07/22/22 [phone]

## 2022-07-22 NOTE — ED Notes (Signed)
Ellender Hose, MD, made aware of lactic 2.4

## 2022-07-22 NOTE — Assessment & Plan Note (Addendum)
-   CPAP at bedtime 

## 2022-07-22 NOTE — ED Notes (Signed)
Respiratory at bedside.

## 2022-07-22 NOTE — Assessment & Plan Note (Signed)
Continue Coreg, losartan. Amlodipine dose was reduced given significant lower extremity edema. Consider alternative agent in follow up.

## 2022-07-22 NOTE — ED Notes (Signed)
Pt had soiled the bed, so EDT changed the bed linen and placed a chuck pad under the pt. EDT slid pt up in the bed so they were comfortable. Pt's wife is at bedside.

## 2022-07-22 NOTE — ED Triage Notes (Signed)
Patient presents with audible wheezing and sob. Seen at [facility] in [facility] yesterday and recommended patient come to ER for further testing.   Reports bilateral feet swelling and abdominal swelling that's new for him per wife.

## 2022-07-23 DIAGNOSIS — J9601 Acute respiratory failure with hypoxia: Secondary | ICD-10-CM | POA: Diagnosis not present

## 2022-07-23 DIAGNOSIS — E1142 Type 2 diabetes mellitus with diabetic polyneuropathy: Secondary | ICD-10-CM | POA: Diagnosis not present

## 2022-07-23 LAB — BASIC METABOLIC PANEL
Anion gap: 7 (ref 5–15)
BUN: 23 mg/dL (ref 8–23)
CO2: 27 mmol/L (ref 22–32)
Calcium: 8 mg/dL — ABNORMAL LOW (ref 8.9–10.3)
Chloride: 104 mmol/L (ref 98–111)
Creatinine, Ser: 0.98 mg/dL (ref 0.61–1.24)
GFR, Estimated: >60 mL/min (ref >60)
Glucose, Bld: 179 mg/dL — ABNORMAL HIGH (ref 70–99)
Potassium: 3.3 mmol/L — ABNORMAL LOW (ref 3.5–5.1)
Sodium: 138 mmol/L (ref 135–145)

## 2022-07-23 LAB — GLUCOSE, CAPILLARY
Glucose-Capillary: 179 mg/dL — ABNORMAL HIGH (ref 70–99)
Glucose-Capillary: 179 mg/dL — ABNORMAL HIGH (ref 70–99)
Glucose-Capillary: 193 mg/dL — ABNORMAL HIGH (ref 70–99)
Glucose-Capillary: 178 mg/dL — ABNORMAL HIGH (ref 70–99)

## 2022-07-23 LAB — CBC WITH DIFFERENTIAL/PLATELET
Abs Immature Granulocytes: 0.02 10*3/uL (ref 0.00–0.07)
Basophils Absolute: 0 10*3/uL (ref 0.0–0.1)
Basophils Relative: 0 %
Eosinophils Absolute: 0.1 10*3/uL (ref 0.0–0.5)
Eosinophils Relative: 1 %
HCT: 37.7 % — ABNORMAL LOW (ref 39.0–52.0)
Hemoglobin: 12.3 g/dL — ABNORMAL LOW (ref 13.0–17.0)
Immature Granulocytes: 0 %
Lymphocytes Relative: 28 %
Lymphs Abs: 1.4 10*3/uL (ref 0.7–4.0)
MCH: 29.5 pg (ref 26.0–34.0)
MCHC: 32.6 g/dL (ref 30.0–36.0)
MCV: 90.4 fL (ref 80.0–100.0)
Monocytes Absolute: 0.5 10*3/uL (ref 0.1–1.0)
Monocytes Relative: 9 %
Neutro Abs: 3 10*3/uL (ref 1.7–7.7)
Neutrophils Relative %: 62 %
Platelets: 140 10*3/uL — ABNORMAL LOW (ref 150–400)
RBC: 4.17 MIL/uL — ABNORMAL LOW (ref 4.22–5.81)
RDW: 13.5 % (ref 11.5–15.5)
WBC: 4.9 10*3/uL (ref 4.0–10.5)
nRBC: 0 % (ref 0.0–0.2)

## 2022-07-23 LAB — MAGNESIUM: Magnesium: 1.4 mg/dL — ABNORMAL LOW (ref 1.7–2.4)

## 2022-07-23 MED ORDER — POTASSIUM CHLORIDE 20 MEQ PO PACK
20.0000 meq | PACK | Freq: Every day | ORAL | Status: DC
  Administered 2022-07-23 – 2022-07-26 (×4): 20 meq via ORAL
  Filled 2022-07-23 (×4): qty 1

## 2022-07-23 MED ORDER — POTASSIUM CHLORIDE 20 MEQ PO PACK
40.0000 meq | PACK | Freq: Once | ORAL | Status: AC
  Administered 2022-07-23: 40 meq via ORAL
  Filled 2022-07-23: qty 2

## 2022-07-23 MED ORDER — POLYETHYLENE GLYCOL 3350 17 G PO PACK
17.0000 g | PACK | Freq: Every day | ORAL | Status: DC
  Administered 2022-07-23 – 2022-07-27 (×5): 17 g via ORAL
  Filled 2022-07-23 (×5): qty 1

## 2022-07-23 MED ORDER — GUAIFENESIN ER 600 MG PO TB12
600.0000 mg | ORAL_TABLET | Freq: Two times a day (BID) | ORAL | Status: DC
  Administered 2022-07-23 – 2022-07-27 (×9): 600 mg via ORAL
  Filled 2022-07-23 (×9): qty 1

## 2022-07-23 NOTE — Progress Notes (Signed)
Progress Note   Patient: Ian Lucas [id number] DOB: [birth year] DOA: 07/22/2022     1 DOS: the patient was seen and examined on 07/23/2022   Brief hospital course:  Ian Lucas is a 72 y.o. male with medical history significant of Type 2 diabetes, rheumatoid arthritis, hypertension, hyperlipidemia, OSA on CPAP, Lewy body dementia, who presents to the ED due to shortness of breath.   Ian Lucas states that for the last year, he has been experiencing gradually worsening shortness of breath that has gotten significantly worse over the last several days.  In addition, he has noticed lower extremity swelling that has been present for at least several weeks and orthopnea.  His family has noticed that his abdomen seems more distended.  He denies any recent fever, chills, nausea, vomiting, diarrhea or abdominal pain.  He denies any chest pain or palpitations.  He denies any known history of CAD or CHF.  He endorses a history of wheezing but states that his PCP at the [place] did a workup for COPD and was told it was negative.   ED course: On arrival to the ED, patient was hypertensive at 143/73 with a heart rate of 75.  He was saturating at 95% on 6 L close was placed on BiPAP due to increased work of breathing.  He was afebrile at 98.1. Initial workup notable for WBC of 4.0, platelets of 139,Sodium of 134, glucose 315, creatinine 1.18, and GFRAbove 60.  BeingP- at 43Troponin of 80.  Lactic acid mildly elevated 2.4 toProcalcitonin negative at less than 0.10.  COVID19, InfluenzaAnd RSV PCR negative.Chest x-ray was obtained that demonstrated patchy opacities in the right mid and lower lung concerning for pneumonia with bilateral pleural effusions.  Patient started on CAP coverage and Lasix due to concern for heart failure exacerbation.  TRH contacted for admission.  3/17 : Patient was on BiPAP which was transitioned to nasal cannula however patient has been satting normal DC'd nasal cannula this morning.   Patient symptomatically feeling better.  Workup has been unremarkable so far.  BNP normal, echo pending.  X-ray concerning for pneumonia but patient has no fever, leukocytosis.  Has some bronchial congestion.  Patient was started on Mucinex.  Total 2.8 L negative since admission.  Will continue Lasix in the meantime pending echo.  Assessment and Plan: * Acute hypoxic respiratory failure ([place]) Patient is coming in today with 1 year history of gradually worsening shortness of breath, orthopnea, lower extremity swelling and abdominal distention with chest x-ray notable for opacities in the right middle and lower lobe and bilateral pleural effusions.  Initial concern was for pneumonia, however WBC is normal, patient is afebrile and procalcitonin is negative.  Given longevity of symptoms, inconsistent with infectious etiology.  - Management of heart failure as noted below - Continue BiPAP - Wean as tolerated to nasal cannula  Acute heart failure ([place]) Patient endorses symptoms of SOB, orthopnea, lower extremity swelling and abdominal distention with evidence of hypervolemia consistent with heart failure. BNP is interestingly normal but this may be secondary to obesity. No prior history of CAD or CHF, but extensive history of HTN.   - Telemetry monitoring - Start Lasix 40 mg IV twice daily - Echocardiogram pending - Daily weights - Strict in and out - Hold home ARB in the setting of diuresis - Continue home carvedilol given he has been on this chronically  Benign essential HTN - Hold home losartan in the setting of diuresis - Decrease dose of amlodipine  given significant lower extremity edema - Continue home carvedilol  Type 2 diabetes mellitus ([place]) - Hold home antiglycemic agents - A1c pending - SSI, moderate  Mild dementia ([place]) Patient was recently diagnosed with Lewy body dementia.  It is mild in nature and he is oriented x 3 at this time.  OSA (obstructive sleep apnea) - Continue  CPAP at bedtime     Subjective: Patient seen and examined this morning.  Vital labs and imaging reviewed.  Patient vitally stable was on 4L nasal cannula which was discontinued in the  morning.  Complains of cough with congestion.  No complaint of substernal chest pain or shortness of breath.  Has been off BiPAP.  Physical Exam: Vitals:   07/23/22 0400 07/23/22 0813 07/23/22 0945 07/23/22 1100  BP: (!) 159/80 (!) 155/78  (!) 142/68  Pulse: 80 82  80  Resp: 20 19  (!) 21  Temp: 98 F (36.7 C) 98 F (36.7 C)  98.1 F (36.7 C)  TempSrc: Oral Oral  Oral  SpO2: (!) 89% 100%  98%  Weight:   94.9 kg   Height:       Physical Exam Constitutional:      Appearance: Normal appearance.  HENT:     Head: Normocephalic and atraumatic.     Nose: Nose normal.  Eyes:     Extraocular Movements: Extraocular movements intact.     Pupils: Pupils are equal, round, and reactive to light.  Cardiovascular:     Rate and Rhythm: Normal rate and regular rhythm.  Pulmonary:     Effort: Respiratory distress present.     Breath sounds: Wheezing and rales present.  Abdominal:     Palpations: Abdomen is soft.  Musculoskeletal:        General: Normal range of motion.     Cervical back: Normal range of motion.  Skin:    General: Skin is warm.  Neurological:     General: No focal deficit present.     Mental Status: He is alert.  Psychiatric:        Mood and Affect: Mood normal.        Behavior: Behavior normal.        Thought Content: Thought content normal.      Data Reviewed:  Results are pending, will review when available.  Family Communication: Wife updated at bedside.  Disposition: Home Status is: Inpatient Remains inpatient appropriate because: CHF/shortness of breath.  Planned Discharge Destination: Home    Time spent: 35 minutes  Author: Oran Rein, MD 07/23/2022 1:17 PM  For on call review [email or web address].

## 2022-07-24 ENCOUNTER — Inpatient Hospital Stay: Payer: No Typology Code available for payment source

## 2022-07-24 ENCOUNTER — Inpatient Hospital Stay (HOSPITAL_COMMUNITY)
Admit: 2022-07-24 | Discharge: 2022-07-24 | Disposition: A | Discharge status: Home / Self Care | Payer: No Typology Code available for payment source | Attending: Internal Medicine

## 2022-07-24 DIAGNOSIS — I5031 Acute diastolic (congestive) heart failure: Secondary | ICD-10-CM | POA: Diagnosis not present

## 2022-07-24 DIAGNOSIS — J9601 Acute respiratory failure with hypoxia: Secondary | ICD-10-CM | POA: Diagnosis not present

## 2022-07-24 LAB — ECHOCARDIOGRAM COMPLETE
AR max vel: 2.28 cm2
AV Area VTI: 3.03 cm2
AV Area mean vel: 2.15 cm2
AV Mean grad: 3 mmHg
AV Peak grad: 5 mmHg
Ao pk vel: 1.12 m/s
Area-P 1/2: 6.27 cm2
Height: 66 in
S' Lateral: 2.5 cm
Weight: 3347.2 oz

## 2022-07-24 LAB — D-DIMER, QUANTITATIVE: D-Dimer, Quant: 1.15 ug/mL-FEU — ABNORMAL HIGH (ref 0.00–0.50)

## 2022-07-24 LAB — BASIC METABOLIC PANEL
Anion gap: 11 (ref 5–15)
BUN: 25 mg/dL — ABNORMAL HIGH (ref 8–23)
CO2: 30 mmol/L (ref 22–32)
Calcium: 8.8 mg/dL — ABNORMAL LOW (ref 8.9–10.3)
Chloride: 97 mmol/L — ABNORMAL LOW (ref 98–111)
Creatinine, Ser: 1.04 mg/dL (ref 0.61–1.24)
GFR, Estimated: >60 mL/min (ref >60)
Glucose, Bld: 196 mg/dL — ABNORMAL HIGH (ref 70–99)
Potassium: 3.4 mmol/L — ABNORMAL LOW (ref 3.5–5.1)
Sodium: 138 mmol/L (ref 135–145)

## 2022-07-24 LAB — GLUCOSE, CAPILLARY
Glucose-Capillary: 215 mg/dL — ABNORMAL HIGH (ref 70–99)
Glucose-Capillary: 218 mg/dL — ABNORMAL HIGH (ref 70–99)
Glucose-Capillary: 190 mg/dL — ABNORMAL HIGH (ref 70–99)
Glucose-Capillary: 194 mg/dL — ABNORMAL HIGH (ref 70–99)

## 2022-07-24 MED ORDER — INSULIN GLARGINE-YFGN 100 UNIT/ML ~~LOC~~ SOLN
5.0000 [IU] | Freq: Every day | SUBCUTANEOUS | Status: DC
  Administered 2022-07-24 – 2022-07-27 (×4): 5 [IU] via SUBCUTANEOUS
  Filled 2022-07-24 (×4): qty 0.05

## 2022-07-24 MED ORDER — MAGNESIUM SULFATE 2 GM/50ML IV SOLN
2.0000 g | Freq: Once | INTRAVENOUS | Status: AC
  Administered 2022-07-24: 2 g via INTRAVENOUS
  Filled 2022-07-24: qty 50

## 2022-07-24 MED ORDER — HALOPERIDOL 2 MG PO TABS: 2.0000 mg | ORAL_TABLET | Freq: Three times a day (TID) | ORAL | Status: DC | PRN

## 2022-07-24 MED ORDER — HALOPERIDOL 2 MG PO TABS
2.0000 mg | ORAL_TABLET | Freq: Three times a day (TID) | ORAL | Status: DC | PRN
  Administered 2022-07-24 – 2022-07-27 (×6): 2 mg via ORAL
  Filled 2022-07-24 (×8): qty 1

## 2022-07-24 NOTE — Progress Notes (Signed)
*  PRELIMINARY RESULTS* Echocardiogram 2D Echocardiogram has been performed.  Ian Lucas 07/24/2022, 7:51 AM

## 2022-07-24 NOTE — Progress Notes (Addendum)
Progress Note   Patient: Ian Lucas [id number] DOB: 1950/11/30 DOA: 07/22/2022     2 DOS: the patient was seen and examined on 07/24/2022   Brief hospital course:  Ian Lucas is a 72 y.o. male with medical history significant of Type 2 diabetes, rheumatoid arthritis, hypertension, hyperlipidemia, OSA on CPAP, Lewy body dementia, who presents to the ED due to shortness of breath.   [name] states that for the last year, he has been experiencing gradually worsening shortness of breath that has gotten significantly worse over the last several days.  In addition, he has noticed lower extremity swelling that has been present for at least several weeks and orthopnea.  His family has noticed that his abdomen seems more distended.  He denies any recent fever, chills, nausea, vomiting, diarrhea or abdominal pain.  He denies any chest pain or palpitations.  He denies any known history of CAD or CHF.  He endorses a history of wheezing but states that his PCP at the [place] did a workup for COPD and was told it was negative.   ED course: On arrival to the ED, patient was hypertensive at 143/73 with a heart rate of 75.  He was saturating at 95% on 6 L close was placed on BiPAP due to increased work of breathing.  He was afebrile at 98.1. Initial workup notable for WBC of 4.0, platelets of 139,Sodium of 134, glucose 315, creatinine 1.18, and GFRAbove 60.  BeingP- at 43Troponin of 80.  Lactic acid mildly elevated 2.4 toProcalcitonin negative at less than 0.10.  COVID19, InfluenzaAnd RSV PCR negative.Chest x-ray was obtained that demonstrated patchy opacities in the right mid and lower lung concerning for pneumonia with bilateral pleural effusions.  Patient started on CAP coverage and Lasix due to concern for heart failure exacerbation.  TRH contacted for admission.  3/17 : Patient was on BiPAP which was transitioned to nasal cannula however patient has been satting normal DC'd nasal cannula this morning.   Patient symptomatically feeling better.  Workup has been unremarkable so far.  BNP normal, echo pending.  X-ray concerning for pneumonia but patient has no fever, leukocytosis.  Has some bronchial congestion.  Patient was started on Mucinex.  Total 2.8 L negative since admission.  Will continue Lasix in the meantime pending echo.  Assessment and Plan: * Acute hypoxic respiratory failure ([place]) Patient is coming in today with 1 year history of gradually worsening shortness of breath, orthopnea, lower extremity swelling and abdominal distention with chest x-ray notable for opacities in the right middle and lower lobe and bilateral pleural effusions.  Initial concern was for pneumonia, however WBC is normal, patient is afebrile and procalcitonin is negative.  Given longevity of symptoms, inconsistent with infectious etiology. Now weaned off bipap and now weaned off o2, breathing comfortably currently on room air  Dyspnea Treated initially for chf though bnp normal and tte with grade 1 dd, otherwise normal. CXR with possible effusions/infiltrate - will check CT of chest to further evaluate abnormal cxr findings. Will also check d-dimer. If elevated would probably need vq scan as there is a documented contrast allergy  Benign essential HTN - held  home losartan in the setting of diuresis - cont amlodipine - Continue home carvedilol  Type 2 diabetes mellitus ([place]) - Hold home antiglycemic agents - A1c pending - SSI, moderate - add basal insulin given elevated glucose  Mild dementia ([place]) Patient was recently diagnosed with Lewy body dementia.  It is mild in nature  and he is oriented x 3 at this time.  OSA (obstructive sleep apnea) - Continue CPAP at bedtime     Subjective: breathing comfortably, no complaints  Physical Exam: Vitals:   07/24/22 0825 07/24/22 0832 07/24/22 1146 07/24/22 1610  BP: (!) 157/82  (!) 162/88 (!) 156/94  Pulse: 77  73 74  Resp: 16  18 16   Temp: 97.9 F (36.6  C)  97.9 F (36.6 C) 98.1 F (36.7 C)  TempSrc:   Oral Oral  SpO2: 93%  96% 97%  Weight:  92.6 kg    Height:       Physical Exam Constitutional:      Appearance: Normal appearance.  HENT:     Head: Normocephalic and atraumatic.     Nose: Nose normal.  Eyes:     Extraocular Movements: Extraocular movements intact.     Pupils: Pupils are equal, round, and reactive to light.  Cardiovascular:     Rate and Rhythm: Normal rate and regular rhythm.  Abdominal:     Palpations: Abdomen is soft.  Musculoskeletal:        General: Normal range of motion.     Cervical back: Normal range of motion.  Skin:    General: Skin is warm.  Neurological:     General: No focal deficit present.     Mental Status: He is alert.  Psychiatric:        Mood and Affect: Mood normal.        Behavior: Behavior normal.        Thought Content: Thought content normal.      Data Reviewed:  Results are pending, will review when available.  Family Communication: Wife updated at bedside.  Disposition: Home Status is: Inpatient Remains inpatient appropriate because: ongoing inpt w/u  Planned Discharge Destination: Home    Time spent: 35 minutes  Author: Desma Maxim, MD 07/24/2022 4:35 PM  For on call review [email or web address].

## 2022-07-25 ENCOUNTER — Inpatient Hospital Stay: Payer: No Typology Code available for payment source

## 2022-07-25 DIAGNOSIS — J9601 Acute respiratory failure with hypoxia: Secondary | ICD-10-CM | POA: Diagnosis not present

## 2022-07-25 LAB — BASIC METABOLIC PANEL
Anion gap: 15 (ref 5–15)
BUN: 28 mg/dL — ABNORMAL HIGH (ref 8–23)
CO2: 28 mmol/L (ref 22–32)
Calcium: 8.8 mg/dL — ABNORMAL LOW (ref 8.9–10.3)
Chloride: 98 mmol/L (ref 98–111)
Creatinine, Ser: 1.04 mg/dL (ref 0.61–1.24)
GFR, Estimated: >60 mL/min (ref >60)
Glucose, Bld: 173 mg/dL — ABNORMAL HIGH (ref 70–99)
Potassium: 2.9 mmol/L — ABNORMAL LOW (ref 3.5–5.1)
Sodium: 141 mmol/L (ref 135–145)

## 2022-07-25 LAB — TSH: TSH: 0.896 u[IU]/mL (ref 0.350–4.500)

## 2022-07-25 LAB — GLUCOSE, CAPILLARY
Glucose-Capillary: 165 mg/dL — ABNORMAL HIGH (ref 70–99)
Glucose-Capillary: 226 mg/dL — ABNORMAL HIGH (ref 70–99)
Glucose-Capillary: 201 mg/dL — ABNORMAL HIGH (ref 70–99)
Glucose-Capillary: 134 mg/dL — ABNORMAL HIGH (ref 70–99)

## 2022-07-25 LAB — MAGNESIUM: Magnesium: 1.9 mg/dL (ref 1.7–2.4)

## 2022-07-25 LAB — VITAMIN B12: Vitamin B-12: 232 pg/mL (ref 180–914)

## 2022-07-25 MED ORDER — AMLODIPINE BESYLATE 5 MG PO TABS: 5.0000 mg | ORAL_TABLET | Freq: Every day | ORAL | Status: DC

## 2022-07-25 MED ORDER — TECHNETIUM TO 99M ALBUMIN AGGREGATED
4.0000 | Freq: Once | INTRAVENOUS | Status: AC | PRN
  Administered 2022-07-25: 4.07 via INTRAVENOUS

## 2022-07-25 MED ORDER — HALOPERIDOL LACTATE 5 MG/ML IJ SOLN: 2.0000 mg | Freq: Once | INTRAMUSCULAR | Status: AC

## 2022-07-25 MED ORDER — POTASSIUM CHLORIDE CRYS ER 20 MEQ PO TBCR
60.0000 meq | EXTENDED_RELEASE_TABLET | Freq: Once | ORAL | Status: AC
  Administered 2022-07-25: 60 meq via ORAL
  Filled 2022-07-25: qty 3

## 2022-07-25 MED ORDER — AZITHROMYCIN 250 MG PO TABS
250.0000 mg | ORAL_TABLET | Freq: Every day | ORAL | Status: DC
  Administered 2022-07-26 – 2022-07-27 (×2): 250 mg via ORAL
  Filled 2022-07-25 (×2): qty 1

## 2022-07-25 MED ORDER — HALOPERIDOL LACTATE 5 MG/ML IJ SOLN
INTRAMUSCULAR | Status: AC
  Administered 2022-07-25: 2 mg via INTRAVENOUS
  Filled 2022-07-25: qty 1

## 2022-07-25 MED ORDER — AMOXICILLIN-POT CLAVULANATE 875-125 MG PO TABS
1.0000 | ORAL_TABLET | Freq: Two times a day (BID) | ORAL | Status: DC
  Administered 2022-07-25 – 2022-07-27 (×5): 1 via ORAL
  Filled 2022-07-25 (×5): qty 1

## 2022-07-25 MED ORDER — AZITHROMYCIN 250 MG PO TABS
500.0000 mg | ORAL_TABLET | Freq: Once | ORAL | Status: AC
  Administered 2022-07-25: 500 mg via ORAL
  Filled 2022-07-25: qty 2

## 2022-07-25 MED ORDER — HYDROXYZINE HCL 10 MG PO TABS
10.0000 mg | ORAL_TABLET | Freq: Three times a day (TID) | ORAL | Status: DC | PRN
  Administered 2022-07-26 – 2022-07-27 (×4): 10 mg via ORAL
  Filled 2022-07-25 (×6): qty 1

## 2022-07-25 MED ORDER — AMOXICILLIN-POT CLAVULANATE 875-125 MG PO TABS: 1.0000 | ORAL_TABLET | Freq: Two times a day (BID) | ORAL | Status: DC

## 2022-07-25 MED ORDER — AMLODIPINE BESYLATE 10 MG PO TABS
10.0000 mg | ORAL_TABLET | Freq: Every day | ORAL | Status: DC
  Administered 2022-07-25 – 2022-07-27 (×3): 10 mg via ORAL
  Filled 2022-07-25 (×3): qty 1

## 2022-07-25 MED ORDER — LOSARTAN POTASSIUM 50 MG PO TABS
100.0000 mg | ORAL_TABLET | Freq: Every day | ORAL | Status: DC
  Administered 2022-07-25 – 2022-07-26 (×2): 100 mg via ORAL
  Filled 2022-07-25 (×2): qty 2

## 2022-07-25 MED ORDER — CARVEDILOL 12.5 MG PO TABS
12.5000 mg | ORAL_TABLET | Freq: Two times a day (BID) | ORAL | Status: DC
  Administered 2022-07-25 – 2022-07-27 (×4): 12.5 mg via ORAL
  Filled 2022-07-25 (×4): qty 1

## 2022-07-25 NOTE — Progress Notes (Addendum)
Progress Note   Patient: Ian Lucas [id number] DOB: 1950-09-20 DOA: 07/22/2022     3 DOS: the patient was seen and examined on 07/25/2022   Brief hospital course:  Ian Lucas is a 72 y.o. male with medical history significant of Type 2 diabetes, rheumatoid arthritis, hypertension, hyperlipidemia, OSA on CPAP, Lewy body dementia, who presents to the ED due to shortness of breath.   Ian Lucas states that for the last year, he has been experiencing gradually worsening shortness of breath that has gotten significantly worse over the last several days.  In addition, he has noticed lower extremity swelling that has been present for at least several weeks and orthopnea.  His family has noticed that his abdomen seems more distended.  He denies any recent fever, chills, nausea, vomiting, diarrhea or abdominal pain.  He denies any chest pain or palpitations.  He denies any known history of CAD or CHF.  He endorses a history of wheezing but states that his PCP at the [place] did a workup for COPD and was told it was negative.   ED course: On arrival to the ED, patient was hypertensive at 143/73 with a heart rate of 75.  He was saturating at 95% on 6 L close was placed on BiPAP due to increased work of breathing.  He was afebrile at 98.1. Initial workup notable for WBC of 4.0, platelets of 139,Sodium of 134, glucose 315, creatinine 1.18, and GFRAbove 60.  BeingP- at 43Troponin of 80.  Lactic acid mildly elevated 2.4 toProcalcitonin negative at less than 0.10.  COVID19, InfluenzaAnd RSV PCR negative.Chest x-ray was obtained that demonstrated patchy opacities in the right mid and lower lung concerning for pneumonia with bilateral pleural effusions.  Patient started on CAP coverage and Lasix due to concern for heart failure exacerbation.  TRH contacted for admission.  3/17 : Patient was on BiPAP which was transitioned to nasal cannula however patient has been satting normal DC'd nasal cannula this morning.   Patient symptomatically feeling better.  Workup has been unremarkable so far.  BNP normal, echo pending.  X-ray concerning for pneumonia but patient has no fever, leukocytosis.  Has some bronchial congestion.  Patient was started on Mucinex.  Total 2.8 L negative since admission.  Will continue Lasix in the meantime pending echo.  Assessment and Plan: * Acute hypoxic respiratory failure (HCC) Dyspnea Patient is coming in today with 1 year history of gradually worsening shortness of breath, orthopnea, lower extremity swelling and abdominal distention with chest x-ray notable for opacities in the right middle and lower lobe and bilateral pleural effusions.  Initial concern was for pneumonia, however WBC is normal, patient is afebrile and procalcitonin is negative.  . Now weaned off bipap and now weaned off o2, breathing comfortably currently on room air. TTE shows diastolic dysfunction, otherwise no significant abnormalities.  CT of chest shows bronchial thickening and b/l opacities compatible with pneumonia. Think reasonable to treat for presumed infection with a course of augmentin and doxycycline, will need outpatient pulmonology f/u - given elevated dimer and contrast allergy will check vq scan - not producing sputum so no sputum culture. Will check urine antigens (legionella and strep)  Benign essential HTN Elevated today - resume home meds  Type 2 diabetes mellitus (HCC) Mild elevation today - A1c pending - SSI, moderate - added basal insulin given elevated glucose  Hypokalemia Likely 2/2 diuresis which is now discontinued - repleting today  Mild dementia [facility]) Wife reports patient was recently diagnosed  with Lewy body dementia. Review of care everywhere does not show recent imaging or labwork and patient reports hasn't yet been done - will check mri, tsh, b12  OSA (obstructive sleep apnea) - Continue CPAP at bedtime     Subjective: breathing comfortably, no  complaints  Physical Exam: Vitals:   07/25/22 0341 07/25/22 0400 07/25/22 0734 07/25/22 1145  BP:  (!) 149/79 (!) 173/80 (!) 155/84  Pulse:  79 75   Resp:  18 16 20   Temp:  98.2 F (36.8 C) 98.1 F (36.7 C)   TempSrc:  Axillary Oral   SpO2:  93% 92% 96%  Weight: 90.3 kg     Height:       Physical Exam Constitutional:      Appearance: Normal appearance.  HENT:     Head: Normocephalic and atraumatic.     Nose: Nose normal.  Eyes:     Extraocular Movements: Extraocular movements intact.     Pupils: Pupils are equal, round, and reactive to light.  Cardiovascular:     Rate and Rhythm: Normal rate and regular rhythm.  Abdominal:     Palpations: Abdomen is soft.  Musculoskeletal:        General: Normal range of motion.     Cervical back: Normal range of motion.  Skin:    General: Skin is warm.  Neurological:     General: No focal deficit present.     Mental Status: He is alert.  Psychiatric:        Mood and Affect: Mood normal.        Behavior: Behavior normal.        Thought Content: Thought content normal.      Data Reviewed:  Results are pending, will review when available.  Family Communication: Wife updated at bedside.  Disposition: Home Status is: Inpatient Remains inpatient appropriate because: ongoing inpt w/u  Planned Discharge Destination: Home    Time spent: 35 minutes  Author: Desma Maxim, MD 07/25/2022 4:17 PM  For on call review [email or web address].

## 2022-07-26 DIAGNOSIS — J9601 Acute respiratory failure with hypoxia: Secondary | ICD-10-CM | POA: Diagnosis not present

## 2022-07-26 DIAGNOSIS — R918 Other nonspecific abnormal finding of lung field: Secondary | ICD-10-CM | POA: Diagnosis present

## 2022-07-26 DIAGNOSIS — N179 Acute kidney failure, unspecified: Secondary | ICD-10-CM

## 2022-07-26 HISTORY — DX: Acute kidney failure, unspecified: N17.9

## 2022-07-26 LAB — BASIC METABOLIC PANEL
Anion gap: 9 (ref 5–15)
BUN: 32 mg/dL — ABNORMAL HIGH (ref 8–23)
CO2: 28 mmol/L (ref 22–32)
Calcium: 8.7 mg/dL — ABNORMAL LOW (ref 8.9–10.3)
Chloride: 101 mmol/L (ref 98–111)
Creatinine, Ser: 1.42 mg/dL — ABNORMAL HIGH (ref 0.61–1.24)
GFR, Estimated: 52 mL/min — ABNORMAL LOW (ref >60)
Glucose, Bld: 159 mg/dL — ABNORMAL HIGH (ref 70–99)
Potassium: 3.5 mmol/L (ref 3.5–5.1)
Sodium: 138 mmol/L (ref 135–145)

## 2022-07-26 LAB — GLUCOSE, CAPILLARY
Glucose-Capillary: 155 mg/dL — ABNORMAL HIGH (ref 70–99)
Glucose-Capillary: 177 mg/dL — ABNORMAL HIGH (ref 70–99)
Glucose-Capillary: 253 mg/dL — ABNORMAL HIGH (ref 70–99)
Glucose-Capillary: 99 mg/dL (ref 70–99)

## 2022-07-26 MED ORDER — LACTATED RINGERS IV SOLN: INTRAVENOUS | Status: DC

## 2022-07-26 MED ORDER — TRAZODONE HCL 50 MG PO TABS
50.0000 mg | ORAL_TABLET | Freq: Every day | ORAL | Status: DC
  Administered 2022-07-26: 50 mg via ORAL
  Filled 2022-07-26: qty 1

## 2022-07-26 MED ORDER — HALOPERIDOL LACTATE 5 MG/ML IJ SOLN
3.0000 mg | Freq: Once | INTRAMUSCULAR | Status: AC
  Administered 2022-07-26: 3 mg via INTRAVENOUS
  Filled 2022-07-26: qty 1

## 2022-07-26 MED ORDER — HALOPERIDOL LACTATE 5 MG/ML IJ SOLN
1.0000 mg | Freq: Once | INTRAMUSCULAR | Status: AC
  Administered 2022-07-26: 1 mg via INTRAVENOUS
  Filled 2022-07-26: qty 1

## 2022-07-26 MED ORDER — VALSARTAN 80 MG PO TABS
160.0000 mg | ORAL_TABLET | Freq: Every day | ORAL | Status: DC
  Filled 2022-07-26: qty 2

## 2022-07-26 MED ORDER — DIAZEPAM 5 MG/ML IJ SOLN
2.5000 mg | Freq: Four times a day (QID) | INTRAMUSCULAR | Status: DC | PRN
  Administered 2022-07-26 – 2022-07-27 (×2): 2.5 mg via INTRAVENOUS
  Filled 2022-07-26 (×2): qty 2

## 2022-07-26 MED ORDER — DIAZEPAM 5 MG/ML IJ SOLN
2.5000 mg | Freq: Once | INTRAMUSCULAR | Status: AC
  Administered 2022-07-26: 2.5 mg via INTRAVENOUS
  Filled 2022-07-26: qty 2

## 2022-07-26 NOTE — Assessment & Plan Note (Signed)
CT chest w/o contrast 3/18 --- diffuse bronchial thickening and fluid in left mainstem brochus (retained secretions vs aspiration), bilateral opacities -- felt infection vs assymetric edema.  CT was done post-diuresis. --Continue PO Augmentin and Zithromax --Supportive care per orders --SLP for swallow evaluation

## 2022-07-26 NOTE — Hospital Course (Addendum)
Ian Lucas is a 72 y.o. male with medical history significant of Type 2 diabetes, rheumatoid arthritis, hypertension, hyperlipidemia, OSA on CPAP, Lewy body dementia, who was admitted on 07/22/2022 after presenting to the ED due to worsening shortness of breath over several days.   See H&P for full HPI on admission.  Marland KitchenMarland KitchenMarland KitchenHe endorses a history of wheezing but states that his PCP at the [place] did a workup for COPD and was told it was negative.   ED course:  Afebrile, BP 143/73, HR 75.  O2 sat was 95% on 6 L/min [place] O2 and was placed on BiPAP due to increased work of breathing.  Initial workup notable for WBC of 4.0, platelets of 139,Sodium of 134, glucose 315, creatinine 1.18, and GFR >60.  COVID19, Influenza and RSV were negative.  Chest x-ray demonstrated patchy opacities in the right mid and lower lung concerning for pneumonia with bilateral pleural effusions.  Started on IV antibiotics for CAP coverage and Lasix due to concern for heart failure exacerbation.     3/20 -- weaned to room air with stable O2 sats.  Clinically improving, but with new AKI today.  Starting IV fluids for hydration, as most likely etiology is poor PO intake due to his dementia and hospital environment.

## 2022-07-26 NOTE — Progress Notes (Addendum)
Progress Note   Patient: Ian Lucas [id number] DOB: 12-24-1950 DOA: 07/22/2022     4 DOS: the patient was seen and examined on 07/26/2022   Brief hospital course: Ian Lucas is a 72 y.o. male with medical history significant of Type 2 diabetes, rheumatoid arthritis, hypertension, hyperlipidemia, OSA on CPAP, Lewy body dementia, who was admitted on 07/22/2022 after presenting to the ED due to worsening shortness of breath over several days.   See H&P for full HPI on admission.  Marland KitchenMarland KitchenMarland KitchenHe endorses a history of wheezing but states that his PCP at the [place] did a workup for COPD and was told it was negative.   ED course:  Afebrile, BP 143/73, HR 75.  O2 sat was 95% on 6 L/min Ian Lucas O2 and was placed on BiPAP due to increased work of breathing.  Initial workup notable for WBC of 4.0, platelets of 139,Sodium of 134, glucose 315, creatinine 1.18, and GFR >60.  COVID19, Influenza and RSV were negative.  Chest x-ray demonstrated patchy opacities in the right mid and lower lung concerning for pneumonia with bilateral pleural effusions.  Started on IV antibiotics for CAP coverage and Lasix due to concern for heart failure exacerbation.     3/20 -- weaned to room air with stable O2 sats.  Clinically improving, but with new AKI today.  Starting IV fluids for hydration, as most likely etiology is poor PO intake due to his dementia and hospital environment.   Assessment and Plan: * Acute hypoxic respiratory failure (HCC) Presented with gradually worsening shortness of breath, orthopnea, lower extremity swelling and abdominal distention with chest x-ray notable for opacities in the right middle and lower lobe and bilateral pleural effusions.   - Mgmt of heart failure as noted below - Required BiPAP for increased WOB - Weaned to room air with stable O2 sats --Monitor, supplement O2 PRN   Multilobar lung infiltrate CT chest w/o contrast 3/18 --- diffuse bronchial thickening and fluid in left mainstem  brochus (retained secretions vs aspiration), bilateral opacities -- felt infection vs assymetric edema.  CT was done post-diuresis. --Continue PO Augmentin and Zithromax --Supportive care per orders --SLP for swallow evaluation   Acute on chronic heart failure with preserved ejection fraction (HFpEF) (Ian Lucas) Patient endorses symptoms of SOB, orthopnea, lower extremity swelling and abdominal distention with evidence of hypervolemia consistent with heart failure. BNP is interestingly normal but this may be secondary to obesity. No prior history of CAD or CHF, but extensive history of HTN.  07/24/22 - Echo -- EF 50-55%, grade I DD, no significant valvular disease --Status post IV Lasix with excellent clinical response. Net IO Since Admission: -6,858.9 mL [07/26/22 1334] - Telemetry monitoring - Holding diuresis & giving gentle fluids, seems he was overdiuresed - Daily weights & strict I/O's - Continue Coreg, losartan  Benign essential HTN Continue Coreg, losartan. Amlodipine dose was reduced given significant lower extremity edema. Consider alternative agent in follow up.  Type 2 diabetes mellitus (Ian Lucas) - Holding home antiglycemic agents - Sliding scale Novolog, moderate  AKI (acute kidney injury) (Ian Lucas) Likely due to over-diuresis. --Gentle IV fluids x 1 day --BMP in AM  Mild dementia (Ian Lucas) Recently diagnosed with Lewy body dementia.  It seems mild in nature, he is oriented x 3.  Nurses report intermittent agitation & restlessness. --Delirium precautions --Haldol PRN agitation  OSA (obstructive sleep apnea) --CPAP at bedtime        Subjective: Pt awake resting in the bed this AM.  He wants to  go home and states he will stay one more night here.  Nurses report him frequently trying to get out of the bed, fall risk.  He got Haldol for safety.  Pt unable to tell me any complaints except wanting to "get out of here".  Physical Exam: Vitals:   07/26/22 0738 07/26/22 0739 07/26/22  1059 07/26/22 1100  BP: (!) 150/83 (!) 150/83 (!) 144/75 (!) 144/75  Pulse:  78 76 81  Resp: 18 18 18 18   Temp: 98.4 F (36.9 C) 98.4 F (36.9 C) 98.3 F (36.8 C) 98.3 F (36.8 C)  TempSrc: Oral Oral Oral Oral  SpO2:  95% 96% 95%  Weight:  88.9 kg    Height:       General exam: awake, alert, no acute distress, confused HEENT: moist mucus membranes, hearing grossly normal  Respiratory system: diminished bases, no expiratory wheezes, normal respiratory effort, on room air. Cardiovascular system: normal S1/S2, RRR, no JVD, murmurs, rubs, gallops, no pedal edema.   Gastrointestinal system: soft, NT, ND, no HSM felt, +bowel sounds. Central nervous system: A&O x self hospital. no gross focal neurologic deficits, normal speech Extremities: moves all, no edema, normal tone Skin: dry, intact, normal temperature, normal color, No rashes, lesions or ulcers Psychiatry: normal mood, congruent affect, judgement and insight appear abnormal due to dementia, difficult to redirect in conversation   Data Reviewed:  Notable labs --- glucose 159, BUN 32, Cr 1.42 from 1.04,    Family Communication: Wife updated by phone this afternoon  Disposition: Status is: Inpatient Remains inpatient appropriate because: new AKI, on IV fluids today.  Possible d/c tomorrow.   Planned Discharge Destination: Home    Time spent: 42 minutes  Author: Ezekiel Slocumb, DO 07/26/2022 1:45 PM  For on call review [email or web address].

## 2022-07-26 NOTE — Progress Notes (Signed)
Pt continues to be agitated, despite IV haldol and PRN atarax. Wife in room requesting to speak to MD. Dr. Arbutus Ped notified, see new orders.

## 2022-07-26 NOTE — Evaluation (Signed)
Physical Therapy Evaluation Patient Details Name: Ian Lucas MRN: [id number] DOB: [birth year] Today's Date: 07/26/2022  History of Present Illness  Ian Lucas is a 72 y.o. male with medical history significant of Type 2 diabetes, rheumatoid arthritis, hypertension, hyperlipidemia, OSA on CPAP, Lewy body dementia, who presents to the ED due to shortness of breath.   Clinical Impression  Pt admitted with above diagnosis. Pt received upright in bed eating lunch. SIL present with pt agreeable to PT with her in room. SIL assists in history taking as pt has baseline dementia and has some mild difficulties with specific home lay out questions. Pt is able to accurately report at baseline he is mod-I with SPC/RW still driving and his spouse assists with household up keep and cooking tasks.   To date, pt relies on minA for bed mobility. Due to baseline dementia pt does rely on providing adequate space for pt to ensure mobility and hand over hand multimodal cues for success. Pt minguard for STS at [place] completing ~200' of step through gait at supervision level. Per pt and DIL pt at baseline functional mobility. Pt returns to recliner with all needs in reach. Pt and PT in agreement no acute PT needs identified. Will continue to mobilize on mobility specialist caseload to maintain strength and prevent deconditioning during acute stay.       Recommendations for follow up therapy are one component of a multi-disciplinary discharge planning process, led by the attending physician.  Recommendations may be updated based on patient status, additional functional criteria and insurance authorization.  Follow Up Recommendations No PT follow up      Assistance Recommended at Discharge Intermittent Supervision/Assistance  Patient can return home with the following  Direct supervision/assist for medications management;Assistance with cooking/housework;Help with stairs or ramp for entrance    Equipment  Recommendations None recommended by PT  Recommendations for Other Services       Functional Status Assessment Patient has not had a recent decline in their functional status     Precautions / Restrictions Precautions Precautions: Fall Restrictions Weight Bearing Restrictions: No      Mobility  Bed Mobility Overal bed mobility: Needs Assistance Bed Mobility: Supine to Sit     Supine to sit: Min assist, HOB elevated     General bed mobility comments: Requires minA to scoot to EOB. Per OT note pt able to perform at mod-I level. Anticipate due to either fatigue from frequent OOB mobility or dementia leading to additional assist. Patient Response: Cooperative  Transfers Overall transfer level: Needs assistance Equipment used: Rolling walker (2 wheels) Transfers: Sit to/from Stand Sit to Stand: Min guard                Ambulation/Gait Ambulation/Gait assistance: Supervision Gait Distance (Feet): 200 Feet Assistive device: Rolling walker (2 wheels) Gait Pattern/deviations: Step-through pattern       General Gait Details: consistent step through gait  Stairs            Wheelchair Mobility    Modified Rankin (Stroke Patients Only)       Balance Overall balance assessment: Mild deficits observed, not formally tested                                           Pertinent Vitals/Pain Pain Assessment Pain Assessment: No/denies pain    Home Living Family/patient expects to be discharged to::  Private residence Living Arrangements: Spouse/significant other Available Help at Discharge: Family;Available 24 hours/day Type of Home: House Home Access: Stairs to enter Entrance Stairs-Rails: Right;Left Entrance Stairs-Number of Steps: 2-3 Alternate Level Stairs-Number of Steps: flight to upstairs Home Layout: Multi-level;Able to live on main level with bedroom/bathroom Home Equipment: Rolling Walker (2 wheels);Grab bars - toilet;Cane - single  point (walking stick) Additional Comments: DIL assists in history and home lay out    Prior Function Prior Level of Function : Independent/Modified Independent             Mobility Comments: relies on RW or SPC/walking stick at baseline. ADLs Comments: Pt reports spouse assists with household upkeep, cooking.     Hand Dominance        Extremity/Trunk Assessment   Upper Extremity Assessment Upper Extremity Assessment: Overall WFL for tasks assessed    Lower Extremity Assessment Lower Extremity Assessment: Overall WFL for tasks assessed    Cervical / Trunk Assessment Cervical / Trunk Assessment: Normal  Communication   Communication: No difficulties  Cognition Arousal/Alertness: Awake/alert Behavior During Therapy: WFL for tasks assessed/performed, Flat affect Overall Cognitive Status: History of cognitive impairments - at baseline                                 General Comments: hx of dementia. Pt with non verbal body language appears mildly annoyed with multiple questions from history taking. Pleasant and cooperative overall following all multimodal cuing needed due to baseline dementia.        General Comments      Exercises Other Exercises Other Exercises: Role of PT in acute setting, d/c recs   Assessment/Plan    PT Assessment Patient does not need any further PT services  PT Problem List Decreased cognition;Decreased balance       PT Treatment Interventions      PT Goals (Current goals can be found in the Care Plan section)  Acute Rehab PT Goals PT Goal Formulation: All assessment and education complete, DC therapy    Frequency       Co-evaluation               AM-PAC PT "6 Clicks" Mobility  Outcome Measure Help needed turning from your back to your side while in a flat bed without using bedrails?: A Little Help needed moving from lying on your back to sitting on the side of a flat bed without using bedrails?: A  Little Help needed moving to and from a bed to a chair (including a wheelchair)?: A Little Help needed standing up from a chair using your arms (e.g., wheelchair or bedside chair)?: A Little Help needed to walk in hospital room?: A Little Help needed climbing 3-5 steps with a railing? : A Little 6 Click Score: 18    End of Session Equipment Utilized During Treatment: Gait belt Activity Tolerance: Patient tolerated treatment well Patient left: in chair;with call bell/phone within reach;with chair alarm set;with family/visitor present Nurse Communication: Mobility status PT Visit Diagnosis: Unsteadiness on feet (R26.81);Muscle weakness (generalized) (M62.81)    Time: [id number] PT Time Calculation (min) (ACUTE ONLY): 23 min   Charges:   PT Evaluation $PT Eval Low Complexity: 1 Low PT Treatments $Therapeutic Activity: 8-22 mins       Laurelai Lepp M. Fairly IV, PT, DPT Physical Therapist- [facility]  07/26/2022, 2:39 PM

## 2022-07-26 NOTE — Assessment & Plan Note (Signed)
Likely due to over-diuresis. --Gentle IV fluids x 1 day --BMP in AM

## 2022-07-26 NOTE — TOC Initial Note (Signed)
Transition of Care [facility]) - Initial/Assessment Note    Patient Details  Name: Ian Lucas MRN: [id number] Date of Birth: [birth year]  Transition of Care [facility]) CM/SW Contact:    Laurena Slimmer, RN Phone Number: 07/26/2022, 4:17 PM  Clinical Narrative:                  Transition of Care [facility]) Screening Note   Patient Details  Name: Ian Lucas Date of Birth: 24-Aug-1950   Transition of Care [facility]) CM/SW Contact:    Laurena Slimmer, RN Phone Number: 07/26/2022, 4:17 PM    Transition of Care Department [facility]) has reviewed patient and no TOC needs have been identified at this time. We will continue to monitor patient advancement through interdisciplinary progression rounds. If new patient transition needs arise, please place a TOC consult.          Patient Goals and CMS Choice            Expected Discharge Plan and Services                                              Prior Living Arrangements/Services                       Activities of Daily Living Home Assistive Devices/Equipment: Cane (specify quad or straight) ADL Screening (condition at time of admission) Patient's cognitive ability adequate to safely complete daily activities?: Yes Is the patient deaf or have difficulty hearing?: No Does the patient have difficulty seeing, even when wearing glasses/contacts?: No Does the patient have difficulty concentrating, remembering, or making decisions?: No Patient able to express need for assistance with ADLs?: Yes Does the patient have difficulty dressing or bathing?: No Independently performs ADLs?: Yes (appropriate for developmental age) Does the patient have difficulty walking or climbing stairs?: Yes Weakness of Legs: Both Weakness of Arms/Hands: None  Permission Sought/Granted                  Emotional Assessment              Admission diagnosis:  Acute respiratory failure with hypoxemia (HCC) [J96.01] Congestive  heart failure, unspecified HF chronicity, unspecified heart failure type ([place]) [I50.9] Acute hypoxic respiratory failure ([place]) [J96.01] Patient Active Problem List   Diagnosis Date Noted   AKI (acute kidney injury) ([place]) 07/26/2022   Multilobar lung infiltrate 07/26/2022   Acute hypoxic respiratory failure ([place]) 07/22/2022   Recurrent major depression ([place]) 07/22/2022   Mild dementia ([place]) 07/22/2022   Type 2 diabetes mellitus ([place]) 07/22/2022   Acute on chronic heart failure with preserved ejection fraction (HFpEF) ([place]) 07/22/2022   Elevated lipase 11/15/2018   Generalized abdominal pain    Chronic pain syndrome 03/08/2016   Ilioinguinal neuralgia of left side 03/08/2016   Elevated prostate specific antigen (PSA) 06/18/2015   Benign essential HTN 06/18/2015   Chronic midline low back pain with bilateral sciatica 06/18/2015   Diabetic neuropathy ([place]) 06/18/2015   Posttraumatic stress disorder 06/18/2015   Dyslipidemia [id number]   Lichen simplex [id number]   History of herpes zoster 06/18/2015   Restless leg 06/18/2015   OSA (obstructive sleep apnea) 06/18/2015   Polyneuropathy 06/18/2015   Idiopathic scoliosis and kyphoscoliosis 12/10/2013   Calculus of kidney 08/21/2013   History of pancreatitis 08/15/2013   Hydrocele 05/19/2013   Arthritis of  knee, degenerative 10/30/2012   Lumbar canal stenosis 10/30/2012   Chronic prostatitis 06/19/2012   Hypogonadism in male 06/19/2012   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   Benign prostatic hyperplasia with urinary obstruction 06/19/2012   PCP:  No primary care provider on file. Pharmacy:   [id number] - Closed - [place] [address] [place] [address] Phone: [phone] Fax: [phone]  [facility] [place] [place] [address] [place] [address] Phone: [phone] Fax:  [place], [place] - [place] AT [facility] [address] [address] Phone: [phone] Fax: [phone]     Social Determinants of Health (SDOH) Social History: [place]: No Food Insecurity (07/22/2022)  Housing: Low Risk  (07/22/2022)  Transportation Needs: No Transportation Needs (07/22/2022)  Utilities: Not At Risk (07/22/2022)  Alcohol Screen: Low Risk  (11/25/2018)  Depression (PHQ2-9): Low Risk  (11/25/2018)  Financial Resource Strain: Low Risk  (11/25/2018)  Physical Activity: Inactive (11/25/2018)  Stress: No Stress Concern Present (11/25/2018)  Tobacco Use: Medium Risk (07/22/2022)   SDOH Interventions:     Readmission Risk Interventions     No data to display

## 2022-07-26 NOTE — Evaluation (Addendum)
Occupational Therapy Evaluation Patient Details Name: Ian Lucas MRN: [id number] DOB: [birth year] Today's Date: 07/26/2022   History of Present Illness Ian Lucas is a 71 y.o. male with medical history significant of Type 2 diabetes, rheumatoid arthritis, hypertension, hyperlipidemia, OSA on CPAP, Lewy body dementia, who presents to the ED due to shortness of breath.   Clinical Impression   Patient agreeable to OT evaluation. Pt with cognitive deficits at baseline and unable to provide complete history. Oriented to self and location this date. Pt currently functioning at Mod I for bed mobility, supervision for simulated toilet transfer, and is grossly set up-supervision for self-care tasks. Pt appears close to baseline and anticipate pt to progress quickly in own home environment. Family will be available to provide physical assistance as needed for ADLs/IADLs upon discharge (confirmed with PT who spoke with family). No follow up needs indicated at this time. Will sign off.     Recommendations for follow up therapy are one component of a multi-disciplinary discharge planning process, led by the attending physician.  Recommendations may be updated based on patient status, additional functional criteria and insurance authorization.   Follow Up Recommendations  No OT follow up     Assistance Recommended at Discharge Set up Supervision/Assistance  Patient can return home with the following A little help with bathing/dressing/bathroom;Assistance with cooking/housework;Assist for transportation;Help with stairs or ramp for entrance;Direct supervision/assist for medications management;Direct supervision/assist for financial management;A little help with walking and/or transfers    Functional Status Assessment  Patient has had a recent decline in their functional status and demonstrates the ability to make significant improvements in function in a reasonable and predictable amount of time.   Equipment Recommendations  None recommended by OT    Recommendations for Other Services       Precautions / Restrictions Precautions Precautions: Fall Restrictions Weight Bearing Restrictions: No      Mobility Bed Mobility Overal bed mobility: Modified Independent                  Transfers Overall transfer level: Needs assistance Equipment used: Rolling walker (2 wheels) Transfers: Sit to/from Stand Sit to Stand: Supervision                  Balance Overall balance assessment: Mild deficits observed, not formally tested         ADL either performed or assessed with clinical judgement   ADL Overall ADL's : Needs assistance/impaired     Grooming: Set up;Supervision/safety;Standing;Wash/dry face               Lower Body Dressing: Set up;Supervision/safety;Sitting/lateral leans Lower Body Dressing Details (indicate cue type and reason): socks  Toilet Transfer: Rolling walker (2 wheels);Supervision/safety Toilet Transfer Details (indicate cue type and reason): simulated Toileting- Clothing Manipulation and Hygiene: Min guard;Sit to/from stand       Functional mobility during ADLs: Supervision/safety;Min guard;Rolling walker (2 wheels) (~40 ft in the hallway)       Vision Baseline Vision/History: 1 Wears glasses Patient Visual Report: No change from baseline       Perception     Praxis      Pertinent Vitals/Pain Pain Assessment Pain Assessment: No/denies pain     Hand Dominance     Extremity/Trunk Assessment Upper Extremity Assessment Upper Extremity Assessment: Overall WFL for tasks assessed   Lower Extremity Assessment Lower Extremity Assessment: Overall WFL for tasks assessed       Communication Communication Communication: No difficulties   Cognition Arousal/Alertness:  Awake/alert Behavior During Therapy: Restless, Anxious Overall Cognitive Status: History of cognitive impairments - at baseline            General Comments: H/o lewy body dementia. A&Ox2 (oriented to self and place). Pt very restless/anxious upon arrival to room and attempting to get OOB. Endorsed feeling "agitated" about life/being in hospital.     General Comments       Exercises Other Exercises Other Exercises: OT provided education re: role of OT, OT POC, post acute recs, sitting up for all meals, EOB/OOB mobility with assistance, home/fall safety.     Shoulder Instructions      Home Living Family/patient expects to be discharged to:: Private residence Living Arrangements: Spouse/significant other       Additional Comments: Pt poor historian, family not present at time of eval and unable to get in touch with spouse by phone.      Prior Functioning/Environment Prior Level of Function : Patient poor historian/Family not available               ADLs Comments: Pt stating "my wife helps me with everything" but unable to describe further.        OT Problem List: Decreased cognition;Decreased safety awareness      OT Treatment/Interventions:      OT Goals(Current goals can be found in the care plan section) Acute Rehab OT Goals Patient Stated Goal: return home OT Goal Formulation: All assessment and education complete, DC therapy  OT Frequency:      Co-evaluation              AM-PAC OT "6 Clicks" Daily Activity     Outcome Measure Help from another person eating meals?: None Help from another person taking care of personal grooming?: A Little Help from another person toileting, which includes using toliet, bedpan, or urinal?: A Little Help from another person bathing (including washing, rinsing, drying)?: A Little Help from another person to put on and taking off regular upper body clothing?: None Help from another person to put on and taking off regular lower body clothing?: A Little 6 Click Score: 20   End of Session Equipment Utilized During Treatment: Gait belt;Rolling walker (2  wheels) Nurse Communication: Mobility status  Activity Tolerance: Patient tolerated treatment well Patient left: in bed;with call bell/phone within reach;with family/visitor present (left sitting EOB with RN and NT present)  OT Visit Diagnosis: Other abnormalities of gait and mobility (R26.89);Other symptoms and signs involving cognitive function                Time: [id number] OT Time Calculation (min): 11 min Charges:  OT General Charges $OT Visit: 1 Visit OT Evaluation $OT Eval Low Complexity: 1 Low  [facility] MS, OTR/L ascom [phone]  07/26/22, 1:33 PM

## 2022-07-26 NOTE — Progress Notes (Signed)
Mobility Specialist - Progress Note   07/26/22 0928  Mobility  Activity Ambulated with assistance in hallway;Stood at bedside;Dangled on edge of bed  Level of Assistance Contact guard assist, steadying assist  Assistive Device None  Distance Ambulated (ft) 120 ft  Activity Response Tolerated well  Mobility Referral Yes  $Mobility charge 1 Mobility   Pt supine in bed on RA upon arrival. Pt completes bed mobility with HHA and extra time. Pt STS and ambulates in hallway CGA with no LOB noted. Pt left in recliner with needs in reach and chair alarm set.   Gretchen Short  Mobility Specialist  07/26/22 9:30 AM

## 2022-07-26 NOTE — Plan of Care (Signed)
  Problem: Coping: Goal: Ability to adjust to condition or change in health will improve Outcome: Progressing   Problem: Fluid Volume: Goal: Ability to maintain a balanced intake and output will improve Outcome: Progressing   Problem: Health Behavior/Discharge Planning: Goal: Ability to identify and utilize available resources and services will improve Outcome: Progressing   Problem: Activity: Goal: Risk for activity intolerance will decrease Outcome: Progressing  Pt ambulated around nurses stationx1 w/ one assist.

## 2022-07-27 ENCOUNTER — Encounter: Payer: Self-pay | Admitting: Internal Medicine

## 2022-07-27 DIAGNOSIS — J9601 Acute respiratory failure with hypoxia: Secondary | ICD-10-CM | POA: Diagnosis not present

## 2022-07-27 LAB — CBC
HCT: 41.3 % (ref 39.0–52.0)
Hemoglobin: 13.8 g/dL (ref 13.0–17.0)
MCH: 29.6 pg (ref 26.0–34.0)
MCHC: 33.4 g/dL (ref 30.0–36.0)
MCV: 88.4 fL (ref 80.0–100.0)
Platelets: 170 10*3/uL (ref 150–400)
RBC: 4.67 MIL/uL (ref 4.22–5.81)
RDW: 12.9 % (ref 11.5–15.5)
WBC: 4.9 10*3/uL (ref 4.0–10.5)
nRBC: 0 % (ref 0.0–0.2)

## 2022-07-27 LAB — HEMOGLOBIN A1C
Hgb A1c MFr Bld: 8.7 % — ABNORMAL HIGH (ref 4.8–5.6)
Mean Plasma Glucose: 203 mg/dL

## 2022-07-27 LAB — GLUCOSE, CAPILLARY
Glucose-Capillary: 157 mg/dL — ABNORMAL HIGH (ref 70–99)
Glucose-Capillary: 193 mg/dL — ABNORMAL HIGH (ref 70–99)

## 2022-07-27 LAB — BASIC METABOLIC PANEL
Anion gap: 14 (ref 5–15)
BUN: 30 mg/dL — ABNORMAL HIGH (ref 8–23)
CO2: 27 mmol/L (ref 22–32)
Calcium: 8.5 mg/dL — ABNORMAL LOW (ref 8.9–10.3)
Chloride: 98 mmol/L (ref 98–111)
Creatinine, Ser: 1.17 mg/dL (ref 0.61–1.24)
GFR, Estimated: >60 mL/min (ref >60)
Glucose, Bld: 151 mg/dL — ABNORMAL HIGH (ref 70–99)
Potassium: 3.4 mmol/L — ABNORMAL LOW (ref 3.5–5.1)
Sodium: 139 mmol/L (ref 135–145)

## 2022-07-27 LAB — MAGNESIUM: Magnesium: 1.7 mg/dL (ref 1.7–2.4)

## 2022-07-27 MED ORDER — IRBESARTAN 150 MG PO TABS: 150.0000 mg | ORAL_TABLET | Freq: Every day | ORAL | 1 refills | Status: AC

## 2022-07-27 MED ORDER — POTASSIUM CHLORIDE 20 MEQ PO PACK: 40.0000 meq | PACK | Freq: Once | ORAL | Status: DC

## 2022-07-27 MED ORDER — AMOXICILLIN-POT CLAVULANATE 875-125 MG PO TABS: 1.0000 | ORAL_TABLET | Freq: Two times a day (BID) | ORAL | 0 refills | Status: AC

## 2022-07-27 MED ORDER — EPINEPHRINE 0.3 MG/0.3ML IJ SOAJ: 0.3000 mg | INTRAMUSCULAR | 1 refills | Status: AC | PRN

## 2022-07-27 MED ORDER — CARVEDILOL 12.5 MG PO TABS: 12.5000 mg | ORAL_TABLET | Freq: Two times a day (BID) | ORAL | 1 refills | Status: AC

## 2022-07-27 MED ORDER — TRAZODONE HCL 50 MG PO TABS: 50.0000 mg | ORAL_TABLET | Freq: Every day | ORAL | 0 refills | Status: AC

## 2022-07-27 MED ORDER — POLYETHYLENE GLYCOL 3350 17 G PO PACK: 17.0000 g | PACK | Freq: Every day | ORAL | 0 refills | Status: AC | PRN

## 2022-07-27 MED ORDER — POTASSIUM CHLORIDE 20 MEQ PO PACK: 40.0000 meq | PACK | Freq: Every day | ORAL | 0 refills | Status: AC

## 2022-07-27 MED ORDER — IRBESARTAN 150 MG PO TABS
150.0000 mg | ORAL_TABLET | Freq: Every day | ORAL | Status: DC
  Administered 2022-07-27: 150 mg via ORAL
  Filled 2022-07-27: qty 1

## 2022-07-27 MED ORDER — HYDROXYZINE HCL 10 MG PO TABS: 10.0000 mg | ORAL_TABLET | Freq: Three times a day (TID) | ORAL | 0 refills | Status: AC | PRN

## 2022-07-27 MED ORDER — POTASSIUM CHLORIDE 20 MEQ PO PACK
40.0000 meq | PACK | Freq: Every day | ORAL | Status: DC
  Administered 2022-07-27: 40 meq via ORAL
  Filled 2022-07-27: qty 2

## 2022-07-27 MED ORDER — GUAIFENESIN ER 600 MG PO TB12: 600.0000 mg | ORAL_TABLET | Freq: Two times a day (BID) | ORAL | 0 refills | Status: AC

## 2022-07-27 NOTE — Discharge Summary (Signed)
Physician Discharge Summary   Patient: Ian Lucas MRN: [id number] DOB: [birth year]  Admit date:     07/22/2022  Discharge date: 07/27/22  Discharge Physician: Ezekiel Slocumb   PCP: No primary care provider on file.   Recommendations at discharge:   Follow up with Primary Care in 1-2 weeks Repeat CBC, BMP, Mg in 1-2 weeks Follow up with Neurology as scheduled or instructed Follow up on tolerance and efficacy of trazodone for sleep and behavioral disturbances due to dementia.  Discharge Diagnoses: Principal Problem:   Acute hypoxic respiratory failure (HCC) Active Problems:   Multilobar lung infiltrate   Acute on chronic heart failure with preserved ejection fraction (HFpEF) (HCC)   Benign essential HTN   Type 2 diabetes mellitus (HCC)   OSA (obstructive sleep apnea)   Mild dementia ([place])  Resolved Problems:   AKI (acute kidney injury) [name])  Hospital Course: Ian Lucas is a 72 y.o. male with medical history significant of Type 2 diabetes, rheumatoid arthritis, hypertension, hyperlipidemia, OSA on CPAP, Lewy body dementia, who was admitted on 07/22/2022 after presenting to the ED due to worsening shortness of breath over several days.   See H&P for full HPI on admission.  Marland KitchenMarland KitchenMarland KitchenHe endorses a history of wheezing but states that his PCP at the [place] did a workup for COPD and was told it was negative.   ED course:  Afebrile, BP 143/73, HR 75.  O2 sat was 95% on 6 L/min [place] O2 and was placed on BiPAP due to increased work of breathing.  Initial workup notable for WBC of 4.0, platelets of 139,Sodium of 134, glucose 315, creatinine 1.18, and GFR >60.  COVID19, Influenza and RSV were negative.  Chest x-ray demonstrated patchy opacities in the right mid and lower lung concerning for pneumonia with bilateral pleural effusions.  Started on IV antibiotics for CAP coverage and Lasix due to concern for heart failure exacerbation.     3/20 -- weaned to room air with stable O2 sats.   Clinically improving, but with new AKI today.  Starting IV fluids for hydration, as most likely etiology is poor PO intake due to his dementia and hospital environment.  3/21 -- renal function recovered with gentle IV hydration.  Pt is medically stable for d/c today.   Assessment and Plan: * Acute hypoxic respiratory failure (HCC) Presented with gradually worsening shortness of breath, orthopnea, lower extremity swelling and abdominal distention with chest x-ray notable for opacities in the right middle and lower lobe and bilateral pleural effusions.   - Mgmt of heart failure as noted below - Required BiPAP for increased WOB - Weaned to room air with stable O2 sats --Monitor, supplement O2 PRN   Multilobar lung infiltrate CT chest w/o contrast 3/18 --- diffuse bronchial thickening and fluid in left mainstem brochus (retained secretions vs aspiration), bilateral opacities -- felt infection vs assymetric edema.  CT was done post-diuresis. --Continue PO Augmentin and Zithromax --Supportive care per orders --SLP for swallow evaluation   Acute on chronic heart failure with preserved ejection fraction (HFpEF) ([place]) Patient endorses symptoms of SOB, orthopnea, lower extremity swelling and abdominal distention with evidence of hypervolemia consistent with heart failure. BNP is interestingly normal but this may be secondary to obesity. No prior history of CAD or CHF, but extensive history of HTN.  07/24/22 - Echo -- EF 50-55%, grade I DD, no significant valvular disease --Status post IV Lasix with excellent clinical response. Net IO Since Admission: -6,858.9 mL [07/26/22 1334] - Telemetry  monitoring - Holding diuresis & giving gentle fluids, seems he was overdiuresed - Daily weights & strict I/O's - Continue Coreg, losartan  Benign essential HTN Continue Coreg, amlodipine Changed losartan to irbesartan for better BP lowering efficacy.  Type 2 diabetes mellitus (HCC) - Holding home antiglycemic  agents - Sliding scale Novolog, moderate --Resume home regimen at d/c. --Outpatient follow up  Mild dementia ([place]) Recently diagnosed with Lewy body dementia.  It seems mild in nature, he is oriented x 3.  Nurses report intermittent agitation & restlessness. --Delirium precautions --Did not respond at all to Haldol PRN agitation, and this should be avoided or minimized in Lewy Body --Responded well to low dose IV Valium PRN --Hydroxyzine PRN anxiety --Trazodone at bedtime can help with dementia related behavioral disturbances --Neurology follow up outpatient  OSA (obstructive sleep apnea) --CPAP at bedtime  AKI (acute kidney injury) (HCC)-resolved as of 07/27/2022 Likely due to over-diuresis. --Gentle IV fluids x 1 day --BMP in AM         Consultants: None Procedures performed: None   Disposition: Home  Diet recommendation:  Cardiac and Carb modified diet  DISCHARGE MEDICATION: Allergies as of 07/27/2022       Reactions   Banana Shortness Of Breath   Codeine Itching   Iodine Swelling   Pt states 30-40 years ago when he had x-ray dye his lips swelled and he threw up. SPM   Peanut Oil Shortness Of Breath   Ace Inhibitors Cough   Banana Extract Allergy Skin Test Other (See Comments)   Gabapentin Other (See Comments)   Anxious and jumpy    Iodinated Contrast Media Other (See Comments)   Lisinopril Cough   Metformin Diarrhea   Peanut-containing Drug Products         Medication List     STOP taking these medications    aspirin 81 MG tablet   buPROPion 150 MG 24 hr tablet Commonly known as: WELLBUTRIN XL   diclofenac sodium 1 % Gel Commonly known as: VOLTAREN   doxycycline 100 MG capsule Commonly known as: VIBRAMYCIN   losartan 100 MG tablet Commonly known as: COZAAR   melatonin 5 MG Tabs   sildenafil 20 MG tablet Commonly known as: REVATIO   Testosterone 10 MG/ACT (2%) Gel       TAKE these medications    AeroChamber MV inhaler Use as  instructed   albuterol 108 (90 Base) MCG/ACT inhaler Commonly known as: VENTOLIN HFA Inhale 2 puffs into the lungs every 6 (six) hours as needed for wheezing or shortness of breath.   Alogliptin Benzoate 25 MG Tabs Take 25 mg by mouth daily.   amLODipine 10 MG tablet Commonly known as: NORVASC Take 10 mg by mouth daily.   amoxicillin-clavulanate 875-125 MG tablet Commonly known as: AUGMENTIN Take 1 tablet by mouth every 12 (twelve) hours for 5 days.   benzonatate 100 MG capsule Commonly known as: TESSALON Take 1 capsule (100 mg total) by mouth 3 (three) times daily as needed for up to 10 days for cough. Never suck or chew on a benzonatate capsule.   carvedilol 12.5 MG tablet Commonly known as: COREG Take 1 tablet (12.5 mg total) by mouth 2 (two) times daily with a meal. What changed: how much to take   donepezil 10 MG tablet Commonly known as: ARICEPT Take 5 mg by mouth in the morning.   DULoxetine 60 MG capsule Commonly known as: CYMBALTA Take 60 mg by mouth daily.   EPINEPHrine 0.3 mg/0.3 mL Soaj  injection Commonly known as: EPI-PEN Inject 0.3 mg into the muscle as needed for anaphylaxis.   ergocalciferol 1.25 MG (50000 UT) capsule Commonly known as: VITAMIN D2 Take 50,000 Units by mouth once a week.   famotidine 20 MG tablet Commonly known as: PEPCID Take 20 mg by mouth daily.   fluticasone-salmeterol 115-21 MCG/ACT inhaler Commonly known as: Advair HFA Inhale 2 puffs into the lungs 2 (two) times daily.   guaiFENesin 600 MG 12 hr tablet Commonly known as: MUCINEX Take 1 tablet (600 mg total) by mouth 2 (two) times daily for 5 days.   hydrOXYzine 10 MG tablet Commonly known as: ATARAX Take 1 tablet (10 mg total) by mouth 3 (three) times daily as needed for anxiety.   ibuprofen 600 MG tablet Commonly known as: ADVIL Take 1 tablet (600 mg total) by mouth every 6 (six) hours as needed. What changed:  how much to take when to take this   irbesartan 150  MG tablet Commonly known as: AVAPRO Take 1 tablet (150 mg total) by mouth daily.   MELATONIN-B6 PO Take 1 tablet by mouth at bedtime.   memantine 10 MG tablet Commonly known as: NAMENDA Take 10 mg by mouth daily.   metFORMIN 500 MG tablet Commonly known as: GLUCOPHAGE Take 500 mg by mouth daily with breakfast.   polyethylene glycol 17 g packet Commonly known as: MIRALAX / GLYCOLAX Take 17 g by mouth daily as needed.   potassium chloride 20 MEQ packet Commonly known as: KLOR-CON Take 40 mEq by mouth daily for 7 days. Start taking on: July 28, 2022   pregabalin 300 MG capsule Commonly known as: LYRICA Take 200 mg by mouth 2 (two) times daily.   rosuvastatin 10 MG tablet Commonly known as: CRESTOR Take 10 mg by mouth daily.   tamsulosin 0.4 MG Caps capsule Commonly known as: FLOMAX Take 0.4 mg by mouth 2 (two) times a day.   tiotropium 18 MCG inhalation capsule Commonly known as: SPIRIVA Place 18 mcg into inhaler and inhale daily. What changed: Another medication with the same name was removed. Continue taking this medication, and follow the directions you see here.   traZODone 50 MG tablet Commonly known as: DESYREL Take 1 tablet (50 mg total) by mouth at bedtime.        Discharge Exam: Filed Weights   07/24/22 0832 07/25/22 0341 07/26/22 0739  Weight: 92.6 kg 90.3 kg 88.9 kg   General exam: awake, alert, no acute distress, confused HEENT: moist mucus membranes, hearing grossly normal  Respiratory system: CTAB, no wheezes, rales or rhonchi, normal respiratory effort. Cardiovascular system: normal S1/S2, RRR, no JVD, murmurs, rubs, gallops, no pedal edema.   Gastrointestinal system: soft, NT, ND, no HSM felt, +bowel sounds. Central nervous system: A&O xself and hospital. no gross focal neurologic deficits, normal speech Extremities: moves all, no edema, normal tone Skin: dry, intact, normal temperature Psychiatry: normal mood, congruent  affect   Condition at discharge: stable  The results of significant diagnostics from this hospitalization (including imaging, microbiology, ancillary and laboratory) are listed below for reference.   Imaging Studies: MR BRAIN WO CONTRAST  Result Date: 07/25/2022 CLINICAL DATA:  Initial evaluation for dementia. EXAM: MRI HEAD WITHOUT CONTRAST TECHNIQUE: Multiplanar, multiecho pulse sequences of the brain and surrounding structures were obtained without intravenous contrast. COMPARISON:  Prior study from 06/17/2010. FINDINGS: Brain: Examination degraded by motion artifact. Cerebral volume within normal limits for age. Patchy T2/FLAIR hyperintensity involving the periventricular deep white matter both cerebral hemispheres,  consistent with chronic small vessel ischemic disease. No evidence for acute or subacute ischemia. Gray-white matter differentiation maintained. No areas of chronic cortical infarction. No visible acute or chronic intracranial blood products. No mass lesion, midline shift or mass effect. No hydrocephalus or extra-axial fluid collection. Vascular: Major intracranial vascular flow voids are grossly maintained. Skull and upper cervical spine: Craniocervical junction within normal limits. Bone marrow signal intensity grossly normal. No scalp soft tissue abnormality. Sinuses/Orbits: Prior bilateral ocular lens replacement. Paranasal sinuses are largely clear. No mastoid effusion. Other: None. IMPRESSION: 1. Motion degraded exam. 2. No acute intracranial abnormality. 3. Mild chronic microvascular ischemic disease for age. Electronically Signed   By: Jeannine Boga M.D.   On: 07/25/2022 20:37   NM Pulmonary Perfusion  Result Date: 07/25/2022 CLINICAL DATA:  Elevated D-dimer EXAM: NUCLEAR MEDICINE PERFUSION LUNG SCAN TECHNIQUE: Perfusion images were obtained in multiple projections after intravenous injection of radiopharmaceutical. Ventilation scans intentionally deferred if perfusion  scan and chest x-ray adequate for interpretation during COVID 19 epidemic. RADIOPHARMACEUTICALS:  4.07 mCi Tc-60m MAA IV COMPARISON:  Chest radiographs done on 07/22/2022, CT chest done on 07/24/2022 FINDINGS: There are no discrete wedge-shaped perfusion defects. There is slightly inhomogeneous tracer uptake in the mid and lower lung fields. IMPRESSION: Imaging findings suggest low probability for pulmonary embolism. There are scattered small foci of decreased perfusion in both lungs, possibly related to patchy infiltrates seen in the previous studies. Electronically Signed   By: Elmer Picker M.D.   On: 07/25/2022 13:04   CT CHEST WO CONTRAST  Result Date: 07/24/2022 CLINICAL DATA:  Dyspnea. EXAM: CT CHEST WITHOUT CONTRAST TECHNIQUE: Multidetector CT imaging of the chest was performed following the standard protocol without IV contrast. RADIATION DOSE REDUCTION: This exam was performed according to the departmental dose-optimization program which includes automated exposure control, adjustment of the mA and/or kV according to patient size and/or use of iterative reconstruction technique. COMPARISON:  Portable chest 07/22/2022, PA and lateral chest 07/21/2022, PA and lateral chest 01/15/2022, and chest CT with contrast 12/14/2015. FINDINGS: Cardiovascular: The heart has slightly enlarged since 2017. No pericardial effusion is seen. Again noted is patchy three-vessel calcification in the coronary arteries. There is mild aortic tortuosity, scattered calcification in the arch segment and proximal right subclavian artery. There is normal great vessel branching. There is no aortic aneurysm. The pulmonary arteries and veins are normal in caliber. Mediastinum/Nodes: The lower poles of the thyroid gland, thoracic trachea, and thoracic esophagus are unremarkable. Axillary spaces are clear. There are mildly enlarged lymph nodes in the mediastinum with a single right paratracheal space node measuring 1.3 cm in short  axis, previously 1 cm, additional subcarinal nodes to the right up to 1.4 cm in short axis, previously 1 cm. There are lymph nodes with partial calcification in the left-sided subcarinal area, AP window, left hilum, seen previously. An enlarged right mid hilar lymph node is noted measuring 1.4 cm in short axis, previously 8 mm. There is no bulky or encasing adenopathy. No left hilar adenopathy. No enlarged lymph nodes could be reactive. Lungs/Pleura: There is diffuse bronchial thickening. There is fluid consistent with retained secretions versus aspirate in the left main bronchus. There are scattered ground-glass infiltrates in the infrahilar right lower lobe, more confluent focal airspace disease in the right upper lobe apex not seen previously and additional peribronchovascular micronodular and ground-glass disease in the anterior and posterior segments of the right upper lobe and posteriorly in the right middle lobe, with additional ground-glass disease medially in  the left upper lobe. Findings are compatible with multilobar pneumonia. There is linear atelectasis or scarring in the left lower lobe. There are trace pleural effusions. Upper Abdomen: Interval cholecystectomy since the prior CT. No biliary dilatation. No acute noncontrast upper abdominal CT findings. Eventration of the left hemidiaphragm. Musculoskeletal: There is multilevel thoracic spine bridging enthesopathy. There is thoracic spondylosis. No chest wall mass or suspicious osseous lesions. IMPRESSION: 1. Diffuse bronchial thickening with fluid in the left main bronchus consistent with retained secretions versus aspirate. 2. Bilateral lung opacities described above compatible with multilobar pneumonia. Follow-up study recommended after treatment to ensure clearing. 3. Trace pleural effusions. 4. Mildly enlarged mediastinal and right hilar lymph nodes possibly reactive. Attention on follow-up recommended. 5. Aortic and coronary artery  atherosclerosis. 6. Mild cardiomegaly.  No findings of acute CHF. Electronically Signed   By: Telford Nab M.D.   On: 07/24/2022 21:11   ECHOCARDIOGRAM COMPLETE  Result Date: 07/24/2022    ECHOCARDIOGRAM REPORT   Patient Name:   KISHAUN NAVES Date of Exam: 07/24/2022 Medical Rec #:  [id number]        Height:       66.0 in Accession #:    [id number]       Weight:       209.2 lb Date of Birth:  1950-10-21         BSA:          2.038 m Patient Age:    31 years         BP:           163/88 mmHg Patient Gender: M                HR:           76 bpm. Exam Location:  [facility] Procedure: 2D Echo, Cardiac Doppler and Color Doppler Indications:     CHF-acute diastolic [id number]  History:         Patient has no prior history of Echocardiogram examinations.                  Risk Factors:Hypertension and Diabetes.  Sonographer:     Sherrie Sport Referring Phys:  [id number] Jose Persia Diagnosing Phys: Kathlyn Sacramento MD  Sonographer Comments: Technically challenging study due to limited acoustic windows and no parasternal window. IMPRESSIONS  1. Left ventricular ejection fraction, by estimation, is 50 to 55%. The left ventricle has low normal function. Left ventricular endocardial border not optimally defined to evaluate regional wall motion. Left ventricular diastolic parameters are consistent with Grade I diastolic dysfunction (impaired relaxation).  2. Right ventricular systolic function is normal. The right ventricular size is normal. Tricuspid regurgitation signal is inadequate for assessing PA pressure.  3. The mitral valve is normal in structure. No evidence of mitral valve regurgitation. No evidence of mitral stenosis.  4. The aortic valve is normal in structure. Aortic valve regurgitation is not visualized. Aortic valve sclerosis/calcification is present, without any evidence of aortic stenosis.  5. limited and challenging image quality. FINDINGS  Left Ventricle: Left ventricular ejection fraction, by estimation, is 50 to  55%. The left ventricle has low normal function. Left ventricular endocardial border not optimally defined to evaluate regional wall motion. The left ventricular internal cavity  size was normal in size. There is no left ventricular hypertrophy. Left ventricular diastolic parameters are consistent with Grade I diastolic dysfunction (impaired relaxation). Right Ventricle: The right ventricular size is normal. No increase in right ventricular wall thickness. Right  ventricular systolic function is normal. Tricuspid regurgitation signal is inadequate for assessing PA pressure. Left Atrium: Left atrial size was normal in size. Right Atrium: Right atrial size was normal in size. Pericardium: There is no evidence of pericardial effusion. Mitral Valve: The mitral valve is normal in structure. There is moderate thickening of the mitral valve leaflet(s). No evidence of mitral valve regurgitation. No evidence of mitral valve stenosis. Tricuspid Valve: The tricuspid valve is normal in structure. Tricuspid valve regurgitation is not demonstrated. No evidence of tricuspid stenosis. Aortic Valve: The aortic valve is normal in structure. Aortic valve regurgitation is not visualized. Aortic valve sclerosis/calcification is present, without any evidence of aortic stenosis. Aortic valve mean gradient measures 3.0 mmHg. Aortic valve peak  gradient measures 5.0 mmHg. Aortic valve area, by VTI measures 3.03 cm. Pulmonic Valve: The pulmonic valve was normal in structure. Pulmonic valve regurgitation is not visualized. No evidence of pulmonic stenosis. Aorta: The aortic root is normal in size and structure. Venous: The inferior vena cava was not well visualized. IAS/Shunts: No atrial level shunt detected by color flow Doppler.  LEFT VENTRICLE PLAX 2D LVIDd:         3.60 cm   Diastology LVIDs:         2.50 cm   LV e' medial:    5.98 cm/s LV PW:         1.10 cm   LV E/e' medial:  10.3 LV IVS:        1.20 cm   LV e' lateral:   5.98 cm/s LVOT  diam:     2.20 cm   LV E/e' lateral: 10.3 LV SV:         62 LV SV Index:   30 LVOT Area:     3.80 cm  RIGHT VENTRICLE RV Basal diam:  3.50 cm RV Mid diam:    3.00 cm RV S prime:     15.90 cm/s LEFT ATRIUM             Index        RIGHT ATRIUM           Index LA diam:        3.20 cm 1.57 cm/m   RA Area:     11.10 cm LA Vol (A2C):   20.7 ml 10.16 ml/m  RA Volume:   21.70 ml  10.65 ml/m LA Vol (A4C):   19.4 ml 9.52 ml/m LA Biplane Vol: 21.9 ml 10.74 ml/m  AORTIC VALVE AV Area (Vmax):    2.28 cm AV Area (Vmean):   2.15 cm AV Area (VTI):     3.03 cm AV Vmax:           112.00 cm/s AV Vmean:          87.000 cm/s AV VTI:            0.203 m AV Peak Grad:      5.0 mmHg AV Mean Grad:      3.0 mmHg LVOT Vmax:         67.10 cm/s LVOT Vmean:        49.100 cm/s LVOT VTI:          0.162 m LVOT/AV VTI ratio: 0.80 MITRAL VALVE               TRICUSPID VALVE MV Area (PHT): 6.27 cm    TR Peak grad:   12.0 mmHg MV Decel Time: 121 msec    TR Vmax:  173.00 cm/s MV E velocity: 61.30 cm/s MV A velocity: 89.60 cm/s  SHUNTS MV E/A ratio:  0.68        Systemic VTI:  0.16 m                            Systemic Diam: 2.20 cm Kathlyn Sacramento MD Electronically signed by Kathlyn Sacramento MD Signature Date/Time: 07/24/2022/12:22:33 PM    Final    DG Chest Portable 1 View  Result Date: 07/22/2022 CLINICAL DATA:  Patient complains of audible wheezing and shortness of breath. EXAM: PORTABLE CHEST 1 VIEW COMPARISON:  07/21/2022 FINDINGS: Mild cardiac enlargement. Lung volumes are low. Blunting of the costophrenic angles are noted bilaterally. Since the prior exam there is been interval development of new patchy opacities in the right mid and right lower lung. No signs of interstitial edema. IMPRESSION: 1. New patchy opacities in the right mid and right lower lung compatible with pneumonia. 2. Possible small bilateral pleural effusions. Electronically Signed   By: Kerby Moors M.D.   On: 07/22/2022 12:11   DG Chest 2 View  Result  Date: 07/21/2022 CLINICAL DATA:  Shortness of breath and crackles. Cough. Congestion. EXAM: CHEST - 2 VIEW COMPARISON:  Chest radiographs 01/15/2022 and 08/19/2020 FINDINGS: Cardiac silhouette is again mildly enlarged. Mediastinal contours are within limits. There is again mild hypoinflation. Left-greater-than-right bibasilar bronchovascular crowding. No definite focal airspace opacity to indicate pneumonia. Mild-to-moderate gaseous distention of the stomach is similar to most recent prior 01/15/2022. Mild multilevel degenerative disc changes of the thoracic spine. IMPRESSION: Mild hypoinflation with bibasilar bronchovascular crowding. No definite pneumonia. Electronically Signed   By: Yvonne Kendall M.D.   On: 07/21/2022 11:52    Microbiology: Results for orders placed or performed during the hospital encounter of 07/22/22  Blood culture (routine x 2)     Status: None (Preliminary result)   Collection Time: 07/22/22 11:43 AM   Specimen: BLOOD  Result Value Ref Range Status   Specimen Description   Final    BLOOD RIGHT ANTECUBITAL Performed at [facility], [address] [place], [place] [address]    Special Requests   Final    BOTTLES DRAWN AEROBIC AND ANAEROBIC Blood Culture adequate volume Performed at [facility], [address] [place], [place] [address]    Culture   Final    NO GROWTH 4 DAYS Performed at [facility], [place] [address] [place], [place] [address]    Report Status PENDING  Incomplete  Resp panel by RT-PCR (RSV, Flu A&B, Covid) Anterior Nasal Swab     Status: None   Collection Time: 07/22/22 11:43 AM   Specimen: Anterior Nasal Swab  Result Value Ref Range Status   SARS Coronavirus 2 by RT PCR NEGATIVE NEGATIVE Final    Comment: (NOTE) SARS-CoV-2 target nucleic acids are NOT DETECTED.  The SARS-CoV-2 RNA is generally detectable in upper respiratory specimens during the acute phase of infection. The lowest concentration of SARS-CoV-2 viral  copies this assay can detect is 138 copies/mL. A negative result does not preclude SARS-Cov-2 infection and should not be used as the sole basis for treatment or other patient management decisions. A negative result may occur with  improper specimen collection/handling, submission of specimen other than nasopharyngeal swab, presence of viral mutation(s) within the areas targeted by this assay, and inadequate number of viral copies(<138 copies/mL). A negative result must be combined with clinical observations, patient history, and epidemiological  information. The expected result is Negative.  Fact Sheet for Patients:  EntrepreneurPulse.com.au  Fact Sheet for Healthcare Providers:  IncredibleEmployment.be  This test is no t yet approved or cleared by the [place] FDA and  has been authorized for detection and/or diagnosis of SARS-CoV-2 by FDA under an Emergency Use Authorization (EUA). This EUA will remain  in effect (meaning this test can be used) for the duration of the COVID-19 declaration under Section 564(b)(1) of the Act, 21 U.S.C.section 360bbb-3(b)(1), unless the authorization is terminated  or revoked sooner.       Influenza A by PCR NEGATIVE NEGATIVE Final   Influenza B by PCR NEGATIVE NEGATIVE Final    Comment: (NOTE) The Xpert Xpress SARS-CoV-2/FLU/RSV plus assay is intended as an aid in the diagnosis of influenza from Nasopharyngeal swab specimens and should not be used as a sole basis for treatment. Nasal washings and aspirates are unacceptable for Xpert Xpress SARS-CoV-2/FLU/RSV testing.  Fact Sheet for Patients: EntrepreneurPulse.com.au  Fact Sheet for Healthcare Providers: IncredibleEmployment.be  This test is not yet approved or cleared by the [place] FDA and has been authorized for detection and/or diagnosis of SARS-CoV-2 by FDA under an Emergency Use Authorization (EUA). This  EUA will remain in effect (meaning this test can be used) for the duration of the COVID-19 declaration under Section 564(b)(1) of the Act, 21 U.S.C. section 360bbb-3(b)(1), unless the authorization is terminated or revoked.     Resp Syncytial Virus by PCR NEGATIVE NEGATIVE Final    Comment: (NOTE) Fact Sheet for Patients: EntrepreneurPulse.com.au  Fact Sheet for Healthcare Providers: IncredibleEmployment.be  This test is not yet approved or cleared by the [place] FDA and has been authorized for detection and/or diagnosis of SARS-CoV-2 by FDA under an Emergency Use Authorization (EUA). This EUA will remain in effect (meaning this test can be used) for the duration of the COVID-19 declaration under Section 564(b)(1) of the Act, 21 U.S.C. section 360bbb-3(b)(1), unless the authorization is terminated or revoked.  Performed at [facility], [address] [place], [place] [address]   Blood culture (routine x 2)     Status: None (Preliminary result)   Collection Time: 07/22/22 12:56 PM   Specimen: BLOOD  Result Value Ref Range Status   Specimen Description   Final    BLOOD RIGHT ARM Performed at [facility], [address], [place], [place] [address]    Special Requests   Final    BOTTLES DRAWN AEROBIC AND ANAEROBIC Blood Culture adequate volume Performed at [facility], [address] [place], [place] [address]    Culture   Final    NO GROWTH 4 DAYS Performed at [facility], [place] [address], [place], [place] [address]    Report Status PENDING  Incomplete    Labs: CBC: Recent Labs  Lab 07/21/22 1120 07/22/22 1143 07/23/22 0449 07/27/22 0547  WBC 5.4 4.0 4.9 4.9  NEUTROABS 3.8 2.6 3.0  --   HGB 12.5* 13.3 12.3* 13.8  HCT 38.3* 41.7 37.7* 41.3  MCV 91.4 93.5 90.4 88.4  PLT 124* 139* 140* [id number]   Basic Metabolic Panel: Recent Labs  Lab 07/23/22 0449 07/24/22 1622 07/25/22 0426  07/26/22 0628 07/27/22 0547  NA 138 138 141 138 139  K 3.3* 3.4* 2.9* 3.5 3.4*  CL 104 97* 98 101 98  CO2 27 30 28 28 27   GLUCOSE 179* 196* 173* 159* 151*  BUN 23 25* 28* 32* 30*  CREATININE 0.98 1.04 1.04  1.42* 1.17  CALCIUM 8.0* 8.8* 8.8* 8.7* 8.5*  MG 1.4*  --  1.9  --  1.7   Liver Function Tests: Recent Labs  Lab 07/22/22 1143  AST 33  ALT 24  ALKPHOS 53  BILITOT 0.7  PROT 8.0  ALBUMIN 4.0   CBG: Recent Labs  Lab 07/26/22 1105 07/26/22 1502 07/26/22 2117 07/27/22 0826 07/27/22 1155  GLUCAP 177* 253* 99 157* 193*    Discharge time spent: less than 30 minutes.  Signed: Ezekiel Slocumb, DO [facility] 07/27/2022

## 2022-07-27 NOTE — Progress Notes (Signed)
Inpatient Diabetes Program Recommendations  AACE/ADA: New Consensus Statement on Inpatient Glycemic Control (2015)  Target Ranges:  Prepandial:   less than 140 mg/dL      Peak postprandial:   less than 180 mg/dL (1-2 hours)      Critically ill patients:  140 - 180 mg/dL   Lab Results  Component Value Date   GLUCAP 157 (H) 07/27/2022   HGBA1C 8.7 (H) 07/26/2022    Review of Glycemic Control  Latest Reference Range & Units 07/26/22 15:02 07/26/22 21:17 07/27/22 08:26  Glucose-Capillary 70 - 99 mg/dL 253 (H) 99 157 (H)   Diabetes history: DM 2 Outpatient Diabetes medications:  Metformin 500 mg bid Current orders for Inpatient glycemic control:  Semglee 5 units daily Novolog 0-15 units tid  Inpatient Diabetes Program Recommendations:    Note A1C is increased to 8.7%.  May need adjustment in outpatient DM regimen. Consider increasing Semglee to 10 units daily while in hospital.   At D/c consider increasing Metformin to 1000 mg bid.  Needs close f/u with PCP.   Thanks,  Adah Perl, RN, BC-ADM Inpatient Diabetes Coordinator Pager [phone]  (8a-5p)

## 2022-07-28 LAB — CULTURE, BLOOD (ROUTINE X 2)
Culture: NO GROWTH
Culture: NO GROWTH
Special Requests: ADEQUATE
Special Requests: ADEQUATE

## 2022-08-30 DIAGNOSIS — G20C Parkinsonism, unspecified: Secondary | ICD-10-CM | POA: Diagnosis not present

## 2022-08-30 DIAGNOSIS — G4752 REM sleep behavior disorder: Secondary | ICD-10-CM | POA: Diagnosis not present

## 2022-10-04 DIAGNOSIS — E113313 Type 2 diabetes mellitus with moderate nonproliferative diabetic retinopathy with macular edema, bilateral: Secondary | ICD-10-CM | POA: Diagnosis not present

## 2022-10-04 DIAGNOSIS — Z961 Presence of intraocular lens: Secondary | ICD-10-CM | POA: Diagnosis not present

## 2022-10-11 ENCOUNTER — Other Ambulatory Visit: Payer: Self-pay

## 2022-10-11 DIAGNOSIS — I5022 Chronic systolic (congestive) heart failure: Secondary | ICD-10-CM | POA: Insufficient documentation

## 2022-10-11 DIAGNOSIS — Z87891 Personal history of nicotine dependence: Secondary | ICD-10-CM | POA: Insufficient documentation

## 2022-10-11 NOTE — Progress Notes (Signed)
Virtual Visit completed. Patient informed on EP and RD appointment and 6 Minute walk test. Patient also informed of patient health questionnaires on My Chart. Patient Verbalizes understanding. Visit diagnosis can be found in [facility] 07/22/2022.

## 2022-10-17 ENCOUNTER — Encounter: Payer: No Typology Code available for payment source | Admitting: *Deleted

## 2022-10-17 VITALS — Ht 67.8 in | Wt 207.7 lb

## 2022-10-17 DIAGNOSIS — I5022 Chronic systolic (congestive) heart failure: Secondary | ICD-10-CM | POA: Diagnosis present

## 2022-10-17 DIAGNOSIS — Z87891 Personal history of nicotine dependence: Secondary | ICD-10-CM | POA: Diagnosis not present

## 2022-10-17 NOTE — Patient Instructions (Signed)
Patient Instructions  Patient Details  Name: Ian Lucas MRN: [id number] Date of Birth: [birth year] Referring Provider:  Lorane Gell, FNP  Below are your personal goals for exercise, nutrition, and risk factors. Our goal is to help you stay on track towards obtaining and maintaining these goals. We will be discussing your progress on these goals with you throughout the program.  Initial Exercise Prescription:  Initial Exercise Prescription - 10/17/22 1100       Date of Initial Exercise RX and Referring Provider   Date 10/17/22    Referring Provider Nelly Laurence MD      Oxygen   Maintain Oxygen Saturation 88% or higher      Treadmill   MPH 2    Grade 0.5    Minutes 15    METs 2.67      Recumbant Bike   Level 1    RPM 50    Watts 7    Minutes 15    METs 2.5      NuStep   Level 1    SPM 80    Minutes 15    METs 2.5      REL-XR   Level 1    Speed 50    Minutes 15    METs 2.5      Track   Laps 30    Minutes 15    METs 2.63      Prescription Details   Frequency (times per week) 3    Duration Progress to 30 minutes of continuous aerobic without signs/symptoms of physical distress      Intensity   THRR 40-80% of Max Heartrate 102-132    Ratings of Perceived Exertion 11-13    Perceived Dyspnea 0-4      Progression   Progression Continue to progress workloads to maintain intensity without signs/symptoms of physical distress.      Resistance Training   Training Prescription Yes    Weight 3 lb             Exercise Goals: Frequency: Be able to perform aerobic exercise two to three times per week in program working toward 2-5 days per week of home exercise.  Intensity: Work with a perceived exertion of 11 (fairly light) - 15 (hard) while following your exercise prescription.  We will make changes to your prescription with you as you progress through the program.   Duration: Be able to do 30 to 45 minutes of continuous aerobic exercise in  addition to a 5 minute warm-up and a 5 minute cool-down routine.   Nutrition Goals: Your personal nutrition goals will be established when you do your nutrition analysis with the dietician.  The following are general nutrition guidelines to follow: Cholesterol < 200mg /day Sodium < 1500mg /day Fiber: Men over 50 yrs - 30 grams per day  Personal Goals:  Personal Goals and Risk Factors at Admission - 10/17/22 1140       Core Components/Risk Factors/Patient Goals on Admission    Weight Management Yes;Weight Loss;Obesity    Intervention Weight Management: Develop a combined nutrition and exercise program designed to reach desired caloric intake, while maintaining appropriate intake of nutrient and fiber, sodium and fats, and appropriate energy expenditure required for the weight goal.;Weight Management: Provide education and appropriate resources to help participant work on and attain dietary goals.;Weight Management/Obesity: Establish reasonable short term and long term weight goals.;Obesity: Provide education and appropriate resources to help participant work on and attain dietary goals.    Admit  Weight 207 lb 11.2 oz (94.2 kg)    Goal Weight: Short Term 202 lb (91.6 kg)    Goal Weight: Long Term 197 lb (89.4 kg)    Expected Outcomes Short Term: Continue to assess and modify interventions until short term weight is achieved;Long Term: Adherence to nutrition and physical activity/exercise program aimed toward attainment of established weight goal;Weight Loss: Understanding of general recommendations for a balanced deficit meal plan, which promotes 1-2 lb weight loss per week and includes a negative energy balance of [phone] kcal/d;Understanding recommendations for meals to include 15-35% energy as protein, 25-35% energy from fat, 35-60% energy from carbohydrates, less than 200mg  of dietary cholesterol, 20-35 gm of total fiber daily;Understanding of distribution of calorie intake throughout the day  with the consumption of 4-5 meals/snacks    Diabetes Yes    Intervention Provide education about signs/symptoms and action to take for hypo/hyperglycemia.;Provide education about proper nutrition, including hydration, and aerobic/resistive exercise prescription along with prescribed medications to achieve blood glucose in normal ranges: Fasting glucose 65-99 mg/dL    Expected Outcomes Short Term: Participant verbalizes understanding of the signs/symptoms and immediate care of hyper/hypoglycemia, proper foot care and importance of medication, aerobic/resistive exercise and nutrition plan for blood glucose control.;Long Term: Attainment of HbA1C < 7%.    Heart Failure Yes    Intervention Provide a combined exercise and nutrition program that is supplemented with education, support and counseling about heart failure. Directed toward relieving symptoms such as shortness of breath, decreased exercise tolerance, and extremity edema.    Expected Outcomes Improve functional capacity of life;Short term: Attendance in program 2-3 days a week with increased exercise capacity. Reported lower sodium intake. Reported increased fruit and vegetable intake. Reports medication compliance.;Short term: Daily weights obtained and reported for increase. Utilizing diuretic protocols set by physician.;Long term: Adoption of self-care skills and reduction of barriers for early signs and symptoms recognition and intervention leading to self-care maintenance.    Hypertension Yes    Intervention Provide education on lifestyle modifcations including regular physical activity/exercise, weight management, moderate sodium restriction and increased consumption of fresh fruit, vegetables, and low fat dairy, alcohol moderation, and smoking cessation.;Monitor prescription use compliance.    Expected Outcomes Short Term: Continued assessment and intervention until BP is < 140/110mm HG in hypertensive participants. < 130/81mm HG in hypertensive  participants with diabetes, heart failure or chronic kidney disease.;Long Term: Maintenance of blood pressure at goal levels.    Lipids Yes    Intervention Provide education and support for participant on nutrition & aerobic/resistive exercise along with prescribed medications to achieve LDL 70mg , HDL >40mg .    Expected Outcomes Short Term: Participant states understanding of desired cholesterol values and is compliant with medications prescribed. Participant is following exercise prescription and nutrition guidelines.;Long Term: Cholesterol controlled with medications as prescribed, with individualized exercise RX and with personalized nutrition plan. Value goals: LDL < 70mg , HDL > 40 mg.             Tobacco Use Initial Evaluation: Social History   Tobacco Use  Smoking Status Former   Packs/day: 0.25   Years: 30.00   Additional pack years: 0.00   Total pack years: 7.50   Types: Cigarettes   Start date: 05/08/1968   Quit date: 06/17/1998   Years since quitting: 24.3  Smokeless Tobacco Never    Exercise Goals and Review:  Exercise Goals     Row Name 10/17/22 1137  Exercise Goals   Increase Physical Activity Yes       Intervention Provide advice, education, support and counseling about physical activity/exercise needs.;Develop an individualized exercise prescription for aerobic and resistive training based on initial evaluation findings, risk stratification, comorbidities and participant's personal goals.       Expected Outcomes Short Term: Attend rehab on a regular basis to increase amount of physical activity.;Long Term: Add in home exercise to make exercise part of routine and to increase amount of physical activity.;Long Term: Exercising regularly at least 3-5 days a week.       Increase Strength and Stamina Yes       Intervention Provide advice, education, support and counseling about physical activity/exercise needs.;Develop an individualized exercise  prescription for aerobic and resistive training based on initial evaluation findings, risk stratification, comorbidities and participant's personal goals.       Expected Outcomes Short Term: Increase workloads from initial exercise prescription for resistance, speed, and METs.;Short Term: Perform resistance training exercises routinely during rehab and add in resistance training at home;Long Term: Improve cardiorespiratory fitness, muscular endurance and strength as measured by increased METs and functional capacity ( )       Able to understand and use rate of perceived exertion (RPE) scale Yes       Intervention Provide education and explanation on how to use RPE scale       Expected Outcomes Long Term:  Able to use RPE to guide intensity level when exercising independently;Short Term: Able to use RPE daily in rehab to express subjective intensity level       Able to understand and use Dyspnea scale Yes       Intervention Provide education and explanation on how to use Dyspnea scale       Expected Outcomes Short Term: Able to use Dyspnea scale daily in rehab to express subjective sense of shortness of breath during exertion;Long Term: Able to use Dyspnea scale to guide intensity level when exercising independently       Knowledge and understanding of Target Heart Rate Range (THRR) Yes       Intervention Provide education and explanation of THRR including how the numbers were predicted and where they are located for reference       Expected Outcomes Short Term: Able to state/look up THRR;Short Term: Able to use daily as guideline for intensity in rehab;Long Term: Able to use THRR to govern intensity when exercising independently       Able to check pulse independently Yes       Intervention Provide education and demonstration on how to check pulse in carotid and radial arteries.;Review the importance of being able to check your own pulse for safety during independent exercise       Expected Outcomes  Short Term: Able to explain why pulse checking is important during independent exercise;Long Term: Able to check pulse independently and accurately       Understanding of Exercise Prescription Yes       Intervention Provide education, explanation, and written materials on patient's individual exercise prescription       Expected Outcomes Short Term: Able to explain program exercise prescription;Long Term: Able to explain home exercise prescription to exercise independently              Copy of goals given to participant.

## 2022-10-17 NOTE — Progress Notes (Signed)
Cardiac Individual Treatment Plan  Patient Details  Name: Ian Lucas MRN: [id number] Date of Birth: 1950-07-04 Referring Provider:   Flowsheet Row Cardiac Rehab from 10/17/2022 in [facility] Cardiac and Pulmonary Rehab  Referring Provider Nelly Laurence MD       Initial Encounter Date:  Flowsheet Row Cardiac Rehab from 10/17/2022 in [facility] Cardiac and Pulmonary Rehab  Date 10/17/22       Visit Diagnosis: Heart failure, chronic systolic (HCC)  Patient's Home Medications on Admission:  Current Outpatient Medications:    albuterol (VENTOLIN HFA) 108 (90 Base) MCG/ACT inhaler, Inhale 2 puffs into the lungs every 6 (six) hours as needed for wheezing or shortness of breath., Disp: 6.7 g, Rfl: 0   Alogliptin Benzoate 25 MG TABS, Take 25 mg by mouth daily., Disp: , Rfl:    amLODipine (NORVASC) 10 MG tablet, Take 10 mg by mouth daily. , Disp: , Rfl:    carbamide peroxide (DEBROX) 6.5 % OTIC solution, Place in ear(s)., Disp: , Rfl:    carvedilol (COREG) 12.5 MG tablet, Take 1 tablet (12.5 mg total) by mouth 2 (two) times daily with a meal. (Patient not taking: Reported on 10/11/2022), Disp: 60 tablet, Rfl: 1   carvedilol (COREG) 25 MG tablet, Take by mouth., Disp: , Rfl:    Cholecalciferol 50 MCG (2000 UT) TABS, Take by mouth., Disp: , Rfl:    donepezil (ARICEPT) 10 MG tablet, Take 5 mg by mouth in the morning. (Patient not taking: Reported on 10/11/2022), Disp: , Rfl:    DULoxetine (CYMBALTA) 60 MG capsule, Take 60 mg by mouth daily. , Disp: , Rfl:    EPINEPHrine 0.3 mg/0.3 mL IJ SOAJ injection, Inject 0.3 mg into the muscle as needed for anaphylaxis., Disp: 1 each, Rfl: 1   ergocalciferol (VITAMIN D2) 1.25 MG (50000 UT) capsule, Take 50,000 Units by mouth once a week., Disp: , Rfl:    famotidine (PEPCID) 20 MG tablet, Take 20 mg by mouth daily., Disp: , Rfl:    fluticasone-salmeterol (ADVAIR HFA) 115-21 MCG/ACT inhaler, Inhale 2 puffs into the lungs 2 (two) times daily., Disp: 1 each, Rfl: 12    hydrOXYzine (ATARAX) 10 MG tablet, Take 1 tablet (10 mg total) by mouth 3 (three) times daily as needed for anxiety., Disp: 30 tablet, Rfl: 0   ibuprofen (ADVIL,MOTRIN) 600 MG tablet, Take 1 tablet (600 mg total) by mouth every 6 (six) hours as needed. (Patient not taking: Reported on 10/11/2022), Disp: 30 tablet, Rfl: 0   ipratropium-albuterol (DUONEB) 0.5-2.5 (3) MG/3ML SOLN, Inhale into the lungs., Disp: , Rfl:    irbesartan (AVAPRO) 150 MG tablet, Take 1 tablet (150 mg total) by mouth daily. (Patient not taking: Reported on 10/11/2022), Disp: 30 tablet, Rfl: 1   irbesartan (AVAPRO) 300 MG tablet, Take by mouth., Disp: , Rfl:    Melatonin-Pyridoxine (MELATONIN-B6 PO), Take 1 tablet by mouth at bedtime., Disp: , Rfl:    meloxicam (MOBIC) 15 MG tablet, Take by mouth., Disp: , Rfl:    memantine (NAMENDA) 10 MG tablet, Take 10 mg by mouth daily. (Patient not taking: Reported on 10/11/2022), Disp: , Rfl:    memantine (NAMENDA) 10 MG tablet, Take 1 tablet by mouth every morning., Disp: , Rfl:    metFORMIN (GLUCOPHAGE) 500 MG tablet, Take 500 mg by mouth daily with breakfast., Disp: , Rfl:    polyethylene glycol (MIRALAX / GLYCOLAX) 17 g packet, Take 17 g by mouth daily as needed., Disp: 14 each, Rfl: 0   potassium chloride (KLOR-CON)  20 MEQ packet, Take 40 mEq by mouth daily for 7 days., Disp: 14 packet, Rfl: 0   pregabalin (LYRICA) 150 MG capsule, Take 1 capsule by mouth at bedtime., Disp: , Rfl:    pregabalin (LYRICA) 300 MG capsule, Take 200 mg by mouth 2 (two) times daily. (Patient not taking: Reported on 10/11/2022), Disp: , Rfl:    pyridOXINE (B-6) 50 MG tablet, Take by mouth., Disp: , Rfl:    rosuvastatin (CRESTOR) 10 MG tablet, Take 10 mg by mouth daily., Disp: , Rfl:    Semaglutide,0.25 or 0.5MG /DOS, 2 MG/3ML SOPN, Inject into the skin. (Patient not taking: Reported on 10/11/2022), Disp: , Rfl:    senna-docusate (SENOKOT-S) 8.6-50 MG tablet, Take by mouth., Disp: , Rfl:    Spacer/Aero-Holding Chambers  (AEROCHAMBER MV) inhaler, Use as instructed, Disp: 1 each, Rfl: 2   spironolactone (ALDACTONE) 25 MG tablet, Take by mouth., Disp: , Rfl:    tamsulosin (FLOMAX) 0.4 MG CAPS capsule, Take 0.4 mg by mouth 2 (two) times a day. , Disp: , Rfl:    tiotropium (SPIRIVA) 18 MCG inhalation capsule, Place 18 mcg into inhaler and inhale daily., Disp: , Rfl:    traZODone (DESYREL) 50 MG tablet, Take 1 tablet (50 mg total) by mouth at bedtime., Disp: 30 tablet, Rfl: 0  Past Medical History: Past Medical History:  Diagnosis Date   AKI (acute kidney injury) (HCC) 07/26/2022   Arthritis    Rheumatoid   Complication of anesthesia    has become combative following surgery   Diabetes mellitus without complication (HCC)    Type 2   History of kidney stones    x 2   History of shingles    Hypercholesteremia    Hypertension    Neuropathy    Pancreatitis    PTSD (post-traumatic stress disorder)    Sleep apnea    CPAP machine   Vertigo    occasional    Tobacco Use: Social History   Tobacco Use  Smoking Status Former   Packs/day: 0.25   Years: 30.00   Additional pack years: 0.00   Total pack years: 7.50   Types: Cigarettes   Start date: 05/08/1968   Quit date: 06/17/1998   Years since quitting: 24.3  Smokeless Tobacco Never    Labs: Review Flowsheet  More data exists      Latest Ref Rng & Units 03/14/2016 08/21/2018 11/15/2018 07/22/2022 07/26/2022  Labs for ITP Cardiac and Pulmonary Rehab  Cholestrol 0 - 200 mg/dL - 161     096  - -  LDL (calc) 0 - 99 mg/dL - 045     409  - -  HDL-C >40 mg/dL - 67     62  - -  Trlycerides <150 mg/dL - 811     57  - -  Hemoglobin A1c 4.8 - 5.6 % 6.8  6.7     6.0  - 8.7   Bicarbonate 20.0 - 28.0 mmol/L - - - 27.9  -  O2 Saturation % - - - 58.5  -    Details       This result is from an external source.          Exercise Target Goals: Exercise Program Goal: Individual exercise prescription set using results from initial 6 min walk test and THRR  while considering  patient's activity barriers and safety.   Exercise Prescription Goal: Initial exercise prescription builds to 30-45 minutes a day of aerobic activity, 2-3 days per week.  Home exercise guidelines will be given to patient during program as part of exercise prescription that the participant will acknowledge.   Education: Aerobic Exercise: - Group verbal and visual presentation on the components of exercise prescription. Introduces F.I.T.T principle from ACSM for exercise prescriptions.  Reviews F.I.T.T. principles of aerobic exercise including progression. Written material given at graduation. Flowsheet Row Cardiac Rehab from 10/17/2022 in [facility] Cardiac and Pulmonary Rehab  Education need identified 10/17/22       Education: Resistance Exercise: - Group verbal and visual presentation on the components of exercise prescription. Introduces F.I.T.T principle from ACSM for exercise prescriptions  Reviews F.I.T.T. principles of resistance exercise including progression. Written material given at graduation.    Education: Exercise & Equipment Safety: - Individual verbal instruction and demonstration of equipment use and safety with use of the equipment. Flowsheet Row Cardiac Rehab from 10/17/2022 in [facility] Cardiac and Pulmonary Rehab  Date 10/17/22  Educator [facility]  Instruction Review Code 1- Verbalizes Understanding       Education: Exercise Physiology & General Exercise Guidelines: - Group verbal and written instruction with models to review the exercise physiology of the cardiovascular system and associated critical values. Provides general exercise guidelines with specific guidelines to those with heart or lung disease.    Education: Flexibility, Balance, Mind/Body Relaxation: - Group verbal and visual presentation with interactive activity on the components of exercise prescription. Introduces F.I.T.T principle from ACSM for exercise prescriptions. Reviews F.I.T.T. principles  of flexibility and balance exercise training including progression. Also discusses the mind body connection.  Reviews various relaxation techniques to help reduce and manage stress (i.e. Deep breathing, progressive muscle relaxation, and visualization). Balance handout provided to take home. Written material given at graduation.   Activity Barriers & Risk Stratification:  Activity Barriers & Cardiac Risk Stratification - 10/17/22 1120       Activity Barriers & Cardiac Risk Stratification   Activity Barriers Deconditioning;Muscular Weakness;Back Problems;Balance Concerns;Assistive Device;Other (comment)    Comments R hand cold and numbness, back pain with standing    Cardiac Risk Stratification High             6 Minute Walk:  6 Minute Walk     Row Name 10/17/22 1120         6 Minute Walk   Phase Initial     Distance 1180 feet     Walk Time 6 minutes     # of Rest Breaks 0     MPH 2.23     METS 2.24     RPE 11     VO2 Peak 7.85     Symptoms No     Resting HR 72 bpm     Resting BP 124/72     Resting Oxygen Saturation  99 %     Exercise Oxygen Saturation  during 6 min walk 95 %     Max Ex. HR 98 bpm     Max Ex. BP 128/64     2 Minute Post BP 124/72              Oxygen Initial Assessment:   Oxygen Re-Evaluation:   Oxygen Discharge (Final Oxygen Re-Evaluation):   Initial Exercise Prescription:  Initial Exercise Prescription - 10/17/22 1100       Date of Initial Exercise RX and Referring Provider   Date 10/17/22    Referring Provider Nelly Laurence MD      Oxygen   Maintain Oxygen Saturation 88% or higher  Treadmill   MPH 2    Grade 0.5    Minutes 15    METs 2.67      Recumbant Bike   Level 1    RPM 50    Watts 7    Minutes 15    METs 2.5      NuStep   Level 1    SPM 80    Minutes 15    METs 2.5      REL-XR   Level 1    Speed 50    Minutes 15    METs 2.5      Track   Laps 30    Minutes 15    METs 2.63      Prescription  Details   Frequency (times per week) 3    Duration Progress to 30 minutes of continuous aerobic without signs/symptoms of physical distress      Intensity   THRR 40-80% of Max Heartrate 102-132    Ratings of Perceived Exertion 11-13    Perceived Dyspnea 0-4      Progression   Progression Continue to progress workloads to maintain intensity without signs/symptoms of physical distress.      Resistance Training   Training Prescription Yes    Weight 3 lb             Perform Capillary Blood Glucose checks as needed.  Exercise Prescription Changes:   Exercise Prescription Changes     Row Name 10/17/22 1100             Response to Exercise   Blood Pressure (Admit) 124/72       Blood Pressure (Exercise) 128/64       Blood Pressure (Exit) 124/72       Heart Rate (Admit) 72 bpm       Heart Rate (Exercise) 98 bpm       Heart Rate (Exit) 79 bpm       Oxygen Saturation (Admit) 99 %       Oxygen Saturation (Exercise) 95 %       Rating of Perceived Exertion (Exercise) 11       Symptoms none       Comments walk test results                Exercise Comments:   Exercise Goals and Review:   Exercise Goals     Row Name 10/17/22 1137             Exercise Goals   Increase Physical Activity Yes       Intervention Provide advice, education, support and counseling about physical activity/exercise needs.;Develop an individualized exercise prescription for aerobic and resistive training based on initial evaluation findings, risk stratification, comorbidities and participant's personal goals.       Expected Outcomes Short Term: Attend rehab on a regular basis to increase amount of physical activity.;Long Term: Add in home exercise to make exercise part of routine and to increase amount of physical activity.;Long Term: Exercising regularly at least 3-5 days a week.       Increase Strength and Stamina Yes       Intervention Provide advice, education, support and counseling  about physical activity/exercise needs.;Develop an individualized exercise prescription for aerobic and resistive training based on initial evaluation findings, risk stratification, comorbidities and participant's personal goals.       Expected Outcomes Short Term: Increase workloads from initial exercise prescription for resistance, speed, and METs.;Short Term: Perform resistance training exercises routinely  during rehab and add in resistance training at home;Long Term: Improve cardiorespiratory fitness, muscular endurance and strength as measured by increased METs and functional capacity ( )       Able to understand and use rate of perceived exertion (RPE) scale Yes       Intervention Provide education and explanation on how to use RPE scale       Expected Outcomes Long Term:  Able to use RPE to guide intensity level when exercising independently;Short Term: Able to use RPE daily in rehab to express subjective intensity level       Able to understand and use Dyspnea scale Yes       Intervention Provide education and explanation on how to use Dyspnea scale       Expected Outcomes Short Term: Able to use Dyspnea scale daily in rehab to express subjective sense of shortness of breath during exertion;Long Term: Able to use Dyspnea scale to guide intensity level when exercising independently       Knowledge and understanding of Target Heart Rate Range (THRR) Yes       Intervention Provide education and explanation of THRR including how the numbers were predicted and where they are located for reference       Expected Outcomes Short Term: Able to state/look up THRR;Short Term: Able to use daily as guideline for intensity in rehab;Long Term: Able to use THRR to govern intensity when exercising independently       Able to check pulse independently Yes       Intervention Provide education and demonstration on how to check pulse in carotid and radial arteries.;Review the importance of being able to check your  own pulse for safety during independent exercise       Expected Outcomes Short Term: Able to explain why pulse checking is important during independent exercise;Long Term: Able to check pulse independently and accurately       Understanding of Exercise Prescription Yes       Intervention Provide education, explanation, and written materials on patient's individual exercise prescription       Expected Outcomes Short Term: Able to explain program exercise prescription;Long Term: Able to explain home exercise prescription to exercise independently                Exercise Goals Re-Evaluation :   Discharge Exercise Prescription (Final Exercise Prescription Changes):  Exercise Prescription Changes - 10/17/22 1100       Response to Exercise   Blood Pressure (Admit) 124/72    Blood Pressure (Exercise) 128/64    Blood Pressure (Exit) 124/72    Heart Rate (Admit) 72 bpm    Heart Rate (Exercise) 98 bpm    Heart Rate (Exit) 79 bpm    Oxygen Saturation (Admit) 99 %    Oxygen Saturation (Exercise) 95 %    Rating of Perceived Exertion (Exercise) 11    Symptoms none    Comments walk test results             Nutrition:  Target Goals: Understanding of nutrition guidelines, daily intake of sodium 1500mg , cholesterol 200mg , calories 30% from fat and 7% or less from saturated fats, daily to have 5 or more servings of fruits and vegetables.  Education: All About Nutrition: -Group instruction provided by verbal, written material, interactive activities, discussions, models, and posters to present general guidelines for heart healthy nutrition including fat, fiber, MyPlate, the role of sodium in heart healthy nutrition, utilization of the nutrition label, and utilization of  this knowledge for meal planning. Follow up email sent as well. Written material given at graduation. Flowsheet Row Cardiac Rehab from 10/17/2022 in [facility] Cardiac and Pulmonary Rehab  Education need identified 10/17/22        Biometrics:  Pre Biometrics - 10/17/22 1138       Pre Biometrics   Height 5' 7.8" (1.722 m)    Weight 207 lb 11.2 oz (94.2 kg)    Waist Circumference 41 inches    Hip Circumference 41 inches    Waist to Hip Ratio 1 %    BMI (Calculated) 31.77    Single Leg Stand 2.9 seconds              Nutrition Therapy Plan and Nutrition Goals:  Nutrition Therapy & Goals - 10/17/22 1139       Intervention Plan   Intervention Prescribe, educate and counsel regarding individualized specific dietary modifications aiming towards targeted core components such as weight, hypertension, lipid management, diabetes, heart failure and other comorbidities.    Expected Outcomes Short Term Goal: Understand basic principles of dietary content, such as calories, fat, sodium, cholesterol and nutrients.;Short Term Goal: A plan has been developed with personal nutrition goals set during dietitian appointment.;Long Term Goal: Adherence to prescribed nutrition plan.             Nutrition Assessments:  MEDIFICTS Score Key: ?70 Need to make dietary changes  40-70 Heart Healthy Diet ? 40 Therapeutic Level Cholesterol Diet  Flowsheet Row Cardiac Rehab from 10/17/2022 in [facility] Cardiac and Pulmonary Rehab  Picture Your Plate Total Score on Admission 39      Picture Your Plate Scores: <16 Unhealthy dietary pattern with much room for improvement. 41-50 Dietary pattern unlikely to meet recommendations for good health and room for improvement. 51-60 More healthful dietary pattern, with some room for improvement.  >60 Healthy dietary pattern, although there may be some specific behaviors that could be improved.    Nutrition Goals Re-Evaluation:   Nutrition Goals Discharge (Final Nutrition Goals Re-Evaluation):   Psychosocial: Target Goals: Acknowledge presence or absence of significant depression and/or stress, maximize coping skills, provide positive support system. Participant is able to verbalize  types and ability to use techniques and skills needed for reducing stress and depression.   Education: Stress, Anxiety, and Depression - Group verbal and visual presentation to define topics covered.  Reviews how body is impacted by stress, anxiety, and depression.  Also discusses healthy ways to reduce stress and to treat/manage anxiety and depression.  Written material given at graduation.   Education: Sleep Hygiene -Provides group verbal and written instruction about how sleep can affect your health.  Define sleep hygiene, discuss sleep cycles and impact of sleep habits. Review good sleep hygiene tips.    Initial Review & Psychosocial Screening:  Initial Psych Review & Screening - 10/11/22 1344       Initial Review   Current issues with Current Anxiety/Panic;Current Psychotropic Meds;Current Depression;History of Depression      Family Dynamics   Good Support System? Yes    Comments He can look to his home nurse for support. His wife is a good supporty system and his son comes by three times a week.      Barriers   Psychosocial barriers to participate in program The patient should benefit from training in stress management and relaxation.;There are no identifiable barriers or psychosocial needs.      Screening Interventions   Interventions To provide support and resources with identified psychosocial  needs;Provide feedback about the scores to participant    Expected Outcomes Short Term goal: Utilizing psychosocial counselor, staff and physician to assist with identification of specific Stressors or current issues interfering with healing process. Setting desired goal for each stressor or current issue identified.;Long Term Goal: Stressors or current issues are controlled or eliminated.;Short Term goal: Identification and review with participant of any Quality of Life or Depression concerns found by scoring the questionnaire.;Long Term goal: The participant improves quality of Life and  PHQ9 Scores as seen by post scores and/or verbalization of changes             Quality of Life Scores:   Quality of Life - 10/17/22 1139       Quality of Life   Select Quality of Life      Quality of Life Scores   Health/Function Pre 19.67 %    Socioeconomic Pre 26.4 %    Psych/Spiritual Pre 25.71 %    Family Pre 28.8 %    GLOBAL Pre 23.47 %            Scores of 19 and below usually indicate a poorer quality of life in these areas.  A difference of  2-3 points is a clinically meaningful difference.  A difference of 2-3 points in the total score of the Quality of Life Index has been associated with significant improvement in overall quality of life, self-image, physical symptoms, and general health in studies assessing change in quality of life.  PHQ-9: Review Flowsheet  More data exists      10/17/2022 11/25/2018 05/29/2016 03/14/2016 12/06/2015  Depression screen PHQ 2/9  Decreased Interest 0 0 0 0 0  Down, Depressed, Hopeless 1 0 0 0 0  PHQ - 2 Score 1 0 0 0 0  Altered sleeping 0 0 - - -  Tired, decreased energy 1 0 - - -  Change in appetite 0 0 - - -  Feeling bad or failure about yourself  1 0 - - -  Trouble concentrating 1 0 - - -  Moving slowly or fidgety/restless 2 0 - - -  Suicidal thoughts 0 0 - - -  PHQ-9 Score 6 0 - - -  Difficult doing work/chores Somewhat difficult Not difficult at all - - -   Interpretation of Total Score  Total Score Depression Severity:  1-4 = Minimal depression, 5-9 = Mild depression, 10-14 = Moderate depression, 15-19 = Moderately severe depression, 20-27 = Severe depression   Psychosocial Evaluation and Intervention:  Psychosocial Evaluation - 10/11/22 1346       Psychosocial Evaluation & Interventions   Interventions Encouraged to exercise with the program and follow exercise prescription;Relaxation education;Stress management education    Comments He can look to his home nurse for support. His wife is a good supporty system and  his son comes by three times a week.    Expected Outcomes Short: Start Heart[facility] to help with mood. Long: Maintain a healthy mental state    Continue Psychosocial Services  Follow up required by staff             Psychosocial Re-Evaluation:   Psychosocial Discharge (Final Psychosocial Re-Evaluation):   Vocational Rehabilitation: Provide vocational rehab assistance to qualifying candidates.   Vocational Rehab Evaluation & Intervention:   Education: Education Goals: Education classes will be provided on a variety of topics geared toward better understanding of heart health and risk factor modification. Participant will state understanding/return demonstration of topics presented as noted by  education test scores.  Learning Barriers/Preferences:  Learning Barriers/Preferences - 10/11/22 1343       Learning Barriers/Preferences   Learning Barriers None    Learning Preferences None             General Cardiac Education Topics:  AED/CPR: - Group verbal and written instruction with the use of models to demonstrate the basic use of the AED with the basic ABC's of resuscitation.   Anatomy and Cardiac Procedures: - Group verbal and visual presentation and models provide information about basic cardiac anatomy and function. Reviews the testing methods done to diagnose heart disease and the outcomes of the test results. Describes the treatment choices: Medical Management, Angioplasty, or Coronary Bypass Surgery for treating various heart conditions including Myocardial Infarction, Angina, Valve Disease, and Cardiac Arrhythmias.  Written material given at graduation. Flowsheet Row Cardiac Rehab from 10/17/2022 in [facility] Cardiac and Pulmonary Rehab  Education need identified 10/17/22       Medication Safety: - Group verbal and visual instruction to review commonly prescribed medications for heart and lung disease. Reviews the medication, class of the drug, and side effects.  Includes the steps to properly store meds and maintain the prescription regimen.  Written material given at graduation.   Intimacy: - Group verbal instruction through game format to discuss how heart and lung disease can affect sexual intimacy. Written material given at graduation..   Know Your Numbers and Heart Failure: - Group verbal and visual instruction to discuss disease risk factors for cardiac and pulmonary disease and treatment options.  Reviews associated critical values for Overweight/Obesity, Hypertension, Cholesterol, and Diabetes.  Discusses basics of heart failure: signs/symptoms and treatments.  Introduces Heart Failure Zone chart for action plan for heart failure.  Written material given at graduation. Flowsheet Row Cardiac Rehab from 10/17/2022 in [facility] Cardiac and Pulmonary Rehab  Education need identified 10/17/22       Infection Prevention: - Provides verbal and written material to individual with discussion of infection control including proper hand washing and proper equipment cleaning during exercise session. Flowsheet Row Cardiac Rehab from 10/17/2022 in [facility] Cardiac and Pulmonary Rehab  Date 10/17/22  Educator [facility]  Instruction Review Code 1- Verbalizes Understanding       Falls Prevention: - Provides verbal and written material to individual with discussion of falls prevention and safety. Flowsheet Row Cardiac Rehab from 10/17/2022 in [facility] Cardiac and Pulmonary Rehab  Date 10/17/22  Educator [facility]  Instruction Review Code 1- Verbalizes Understanding       Other: -Provides group and verbal instruction on various topics (see comments)   Knowledge Questionnaire Score:  Knowledge Questionnaire Score - 10/17/22 1140       Knowledge Questionnaire Score   Pre Score 20/26             Core Components/Risk Factors/Patient Goals at Admission:  Personal Goals and Risk Factors at Admission - 10/17/22 1140       Core Components/Risk Factors/Patient Goals on  Admission    Weight Management Yes;Weight Loss;Obesity    Intervention Weight Management: Develop a combined nutrition and exercise program designed to reach desired caloric intake, while maintaining appropriate intake of nutrient and fiber, sodium and fats, and appropriate energy expenditure required for the weight goal.;Weight Management: Provide education and appropriate resources to help participant work on and attain dietary goals.;Weight Management/Obesity: Establish reasonable short term and long term weight goals.;Obesity: Provide education and appropriate resources to help participant work on and attain dietary goals.    Admit  Weight 207 lb 11.2 oz (94.2 kg)    Goal Weight: Short Term 202 lb (91.6 kg)    Goal Weight: Long Term 197 lb (89.4 kg)    Expected Outcomes Short Term: Continue to assess and modify interventions until short term weight is achieved;Long Term: Adherence to nutrition and physical activity/exercise program aimed toward attainment of established weight goal;Weight Loss: Understanding of general recommendations for a balanced deficit meal plan, which promotes 1-2 lb weight loss per week and includes a negative energy balance of [phone] kcal/d;Understanding recommendations for meals to include 15-35% energy as protein, 25-35% energy from fat, 35-60% energy from carbohydrates, less than 200mg  of dietary cholesterol, 20-35 gm of total fiber daily;Understanding of distribution of calorie intake throughout the day with the consumption of 4-5 meals/snacks    Diabetes Yes    Intervention Provide education about signs/symptoms and action to take for hypo/hyperglycemia.;Provide education about proper nutrition, including hydration, and aerobic/resistive exercise prescription along with prescribed medications to achieve blood glucose in normal ranges: Fasting glucose 65-99 mg/dL    Expected Outcomes Short Term: Participant verbalizes understanding of the signs/symptoms and immediate care  of hyper/hypoglycemia, proper foot care and importance of medication, aerobic/resistive exercise and nutrition plan for blood glucose control.;Long Term: Attainment of HbA1C < 7%.    Heart Failure Yes    Intervention Provide a combined exercise and nutrition program that is supplemented with education, support and counseling about heart failure. Directed toward relieving symptoms such as shortness of breath, decreased exercise tolerance, and extremity edema.    Expected Outcomes Improve functional capacity of life;Short term: Attendance in program 2-3 days a week with increased exercise capacity. Reported lower sodium intake. Reported increased fruit and vegetable intake. Reports medication compliance.;Short term: Daily weights obtained and reported for increase. Utilizing diuretic protocols set by physician.;Long term: Adoption of self-care skills and reduction of barriers for early signs and symptoms recognition and intervention leading to self-care maintenance.    Hypertension Yes    Intervention Provide education on lifestyle modifcations including regular physical activity/exercise, weight management, moderate sodium restriction and increased consumption of fresh fruit, vegetables, and low fat dairy, alcohol moderation, and smoking cessation.;Monitor prescription use compliance.    Expected Outcomes Short Term: Continued assessment and intervention until BP is < 140/58mm HG in hypertensive participants. < 130/53mm HG in hypertensive participants with diabetes, heart failure or chronic kidney disease.;Long Term: Maintenance of blood pressure at goal levels.    Lipids Yes    Intervention Provide education and support for participant on nutrition & aerobic/resistive exercise along with prescribed medications to achieve LDL 70mg , HDL >40mg .    Expected Outcomes Short Term: Participant states understanding of desired cholesterol values and is compliant with medications prescribed. Participant is following  exercise prescription and nutrition guidelines.;Long Term: Cholesterol controlled with medications as prescribed, with individualized exercise RX and with personalized nutrition plan. Value goals: LDL < 70mg , HDL > 40 mg.             Education:Diabetes - Individual verbal and written instruction to review signs/symptoms of diabetes, desired ranges of glucose level fasting, after meals and with exercise. Acknowledge that pre and post exercise glucose checks will be done for 3 sessions at entry of program. Flowsheet Row Cardiac Rehab from 10/17/2022 in [facility] Cardiac and Pulmonary Rehab  Date 10/17/22  Educator [facility]  Instruction Review Code 1- Verbalizes Understanding       Core Components/Risk Factors/Patient Goals Review:    Core Components/Risk Factors/Patient Goals at Discharge (Final Review):  ITP Comments:  ITP Comments     Row Name 10/11/22 1346 10/17/22 1120         ITP Comments Virtual Visit completed. Patient informed on EP and RD appointment and 6 Minute walk test. Patient also informed of patient health questionnaires on My Chart. Patient Verbalizes understanding. Visit diagnosis can be found in [facility] 07/22/2022. Completed and gym orientation. Initial ITP created and sent for review to Dr. Bethann Punches, Medical Director.               Comments: Initial ITP

## 2022-10-18 ENCOUNTER — Encounter: Payer: Self-pay | Admitting: *Deleted

## 2022-10-18 DIAGNOSIS — I5022 Chronic systolic (congestive) heart failure: Secondary | ICD-10-CM | POA: Diagnosis not present

## 2022-10-18 LAB — GLUCOSE, CAPILLARY
Glucose-Capillary: 160 mg/dL — ABNORMAL HIGH (ref 70–99)
Glucose-Capillary: 220 mg/dL — ABNORMAL HIGH (ref 70–99)

## 2022-10-18 NOTE — Progress Notes (Signed)
Daily Session Note  Patient Details  Name: Ian Lucas MRN: [id number] Date of Birth: 14-Sep-1950 Referring Provider:   Flowsheet Row Cardiac Rehab from 10/17/2022 in [facility] Cardiac and Pulmonary Rehab  Referring Provider Nelly Laurence MD       Encounter Date: 10/18/2022  Check In:  Session Check In - 10/18/22 1036       Check-In   Supervising physician immediately available to respond to emergencies See telemetry face sheet for immediately available ER MD    Location [facility]-Cardiac & Pulmonary Rehab    Staff Present Susann Givens, RN BSN;Joseph [place], RCP,RRT,BSRT;Jessica [place], [place], RCEP, CCRP, [place], [place], Exercise Physiologist    Virtual Visit No    Medication changes reported     No    Fall or balance concerns reported    No    Warm-up and Cool-down Performed on first and last piece of equipment    Resistance Training Performed Yes    VAD Patient? No    PAD/SET Patient? No      Pain Assessment   Currently in Pain? No/denies                Social History   Tobacco Use  Smoking Status Former   Packs/day: 0.25   Years: 30.00   Additional pack years: 0.00   Total pack years: 7.50   Types: Cigarettes   Start date: 05/08/1968   Quit date: 06/17/1998   Years since quitting: 24.3  Smokeless Tobacco Never    Goals Met:  Independence with exercise equipment Exercise tolerated well No report of concerns or symptoms today Strength training completed today  Goals Unmet:  Not Applicable  Comments: First full day of exercise!  Patient was oriented to gym and equipment including functions, settings, policies, and procedures.  Patient's individual exercise prescription and treatment plan were reviewed.  All starting workloads were established based on the results of the 6 minute walk test done at initial orientation visit.  The plan for exercise progression was also introduced and progression will be customized based on patient's performance and  goals.     Dr. Bethann Punches is Medical Director for [facility] Cardiac Rehabilitation.  Dr. Vida Rigger is Medical Director for [facility] Pulmonary Rehabilitation.

## 2022-10-18 NOTE — Progress Notes (Signed)
Cardiac Individual Treatment Plan  Patient Details  Name: Ian Lucas MRN: [id number] Date of Birth: April 27, 1951 Referring Provider:   Flowsheet Row Cardiac Rehab from 10/17/2022 in [facility] Cardiac and Pulmonary Rehab  Referring Provider Nelly Laurence MD       Initial Encounter Date:  Flowsheet Row Cardiac Rehab from 10/17/2022 in [facility] Cardiac and Pulmonary Rehab  Date 10/17/22       Visit Diagnosis: Heart failure, chronic systolic (HCC)  Patient's Home Medications on Admission:  Current Outpatient Medications:    albuterol (VENTOLIN HFA) 108 (90 Base) MCG/ACT inhaler, Inhale 2 puffs into the lungs every 6 (six) hours as needed for wheezing or shortness of breath., Disp: 6.7 g, Rfl: 0   Alogliptin Benzoate 25 MG TABS, Take 25 mg by mouth daily., Disp: , Rfl:    amLODipine (NORVASC) 10 MG tablet, Take 10 mg by mouth daily. , Disp: , Rfl:    carbamide peroxide (DEBROX) 6.5 % OTIC solution, Place in ear(s)., Disp: , Rfl:    carvedilol (COREG) 12.5 MG tablet, Take 1 tablet (12.5 mg total) by mouth 2 (two) times daily with a meal. (Patient not taking: Reported on 10/11/2022), Disp: 60 tablet, Rfl: 1   carvedilol (COREG) 25 MG tablet, Take by mouth., Disp: , Rfl:    Cholecalciferol 50 MCG (2000 UT) TABS, Take by mouth., Disp: , Rfl:    donepezil (ARICEPT) 10 MG tablet, Take 5 mg by mouth in the morning. (Patient not taking: Reported on 10/11/2022), Disp: , Rfl:    DULoxetine (CYMBALTA) 60 MG capsule, Take 60 mg by mouth daily. , Disp: , Rfl:    EPINEPHrine 0.3 mg/0.3 mL IJ SOAJ injection, Inject 0.3 mg into the muscle as needed for anaphylaxis., Disp: 1 each, Rfl: 1   ergocalciferol (VITAMIN D2) 1.25 MG (50000 UT) capsule, Take 50,000 Units by mouth once a week., Disp: , Rfl:    famotidine (PEPCID) 20 MG tablet, Take 20 mg by mouth daily., Disp: , Rfl:    fluticasone-salmeterol (ADVAIR HFA) 115-21 MCG/ACT inhaler, Inhale 2 puffs into the lungs 2 (two) times daily., Disp: 1 each, Rfl: 12    hydrOXYzine (ATARAX) 10 MG tablet, Take 1 tablet (10 mg total) by mouth 3 (three) times daily as needed for anxiety., Disp: 30 tablet, Rfl: 0   ibuprofen (ADVIL,MOTRIN) 600 MG tablet, Take 1 tablet (600 mg total) by mouth every 6 (six) hours as needed. (Patient not taking: Reported on 10/11/2022), Disp: 30 tablet, Rfl: 0   ipratropium-albuterol (DUONEB) 0.5-2.5 (3) MG/3ML SOLN, Inhale into the lungs., Disp: , Rfl:    irbesartan (AVAPRO) 150 MG tablet, Take 1 tablet (150 mg total) by mouth daily. (Patient not taking: Reported on 10/11/2022), Disp: 30 tablet, Rfl: 1   irbesartan (AVAPRO) 300 MG tablet, Take by mouth., Disp: , Rfl:    Melatonin-Pyridoxine (MELATONIN-B6 PO), Take 1 tablet by mouth at bedtime., Disp: , Rfl:    meloxicam (MOBIC) 15 MG tablet, Take by mouth., Disp: , Rfl:    memantine (NAMENDA) 10 MG tablet, Take 10 mg by mouth daily. (Patient not taking: Reported on 10/11/2022), Disp: , Rfl:    memantine (NAMENDA) 10 MG tablet, Take 1 tablet by mouth every morning., Disp: , Rfl:    metFORMIN (GLUCOPHAGE) 500 MG tablet, Take 500 mg by mouth daily with breakfast., Disp: , Rfl:    polyethylene glycol (MIRALAX / GLYCOLAX) 17 g packet, Take 17 g by mouth daily as needed., Disp: 14 each, Rfl: 0   potassium chloride (KLOR-CON)  20 MEQ packet, Take 40 mEq by mouth daily for 7 days., Disp: 14 packet, Rfl: 0   pregabalin (LYRICA) 150 MG capsule, Take 1 capsule by mouth at bedtime., Disp: , Rfl:    pregabalin (LYRICA) 300 MG capsule, Take 200 mg by mouth 2 (two) times daily. (Patient not taking: Reported on 10/11/2022), Disp: , Rfl:    pyridOXINE (B-6) 50 MG tablet, Take by mouth., Disp: , Rfl:    rosuvastatin (CRESTOR) 10 MG tablet, Take 10 mg by mouth daily., Disp: , Rfl:    Semaglutide,0.25 or 0.5MG /DOS, 2 MG/3ML SOPN, Inject into the skin. (Patient not taking: Reported on 10/11/2022), Disp: , Rfl:    senna-docusate (SENOKOT-S) 8.6-50 MG tablet, Take by mouth., Disp: , Rfl:    Spacer/Aero-Holding Chambers  (AEROCHAMBER MV) inhaler, Use as instructed, Disp: 1 each, Rfl: 2   spironolactone (ALDACTONE) 25 MG tablet, Take by mouth., Disp: , Rfl:    tamsulosin (FLOMAX) 0.4 MG CAPS capsule, Take 0.4 mg by mouth 2 (two) times a day. , Disp: , Rfl:    tiotropium (SPIRIVA) 18 MCG inhalation capsule, Place 18 mcg into inhaler and inhale daily., Disp: , Rfl:    traZODone (DESYREL) 50 MG tablet, Take 1 tablet (50 mg total) by mouth at bedtime., Disp: 30 tablet, Rfl: 0  Past Medical History: Past Medical History:  Diagnosis Date   AKI (acute kidney injury) (HCC) 07/26/2022   Arthritis    Rheumatoid   Complication of anesthesia    has become combative following surgery   Diabetes mellitus without complication (HCC)    Type 2   History of kidney stones    x 2   History of shingles    Hypercholesteremia    Hypertension    Neuropathy    Pancreatitis    PTSD (post-traumatic stress disorder)    Sleep apnea    CPAP machine   Vertigo    occasional    Tobacco Use: Social History   Tobacco Use  Smoking Status Former   Packs/day: 0.25   Years: 30.00   Additional pack years: 0.00   Total pack years: 7.50   Types: Cigarettes   Start date: 05/08/1968   Quit date: 06/17/1998   Years since quitting: 24.3  Smokeless Tobacco Never    Labs: Review Flowsheet  More data exists      Latest Ref Rng & Units 03/14/2016 08/21/2018 11/15/2018 07/22/2022 07/26/2022  Labs for ITP Cardiac and Pulmonary Rehab  Cholestrol 0 - 200 mg/dL - 981     191  - -  LDL (calc) 0 - 99 mg/dL - 478     295  - -  HDL-C >40 mg/dL - 67     62  - -  Trlycerides <150 mg/dL - 621     57  - -  Hemoglobin A1c 4.8 - 5.6 % 6.8  6.7     6.0  - 8.7   Bicarbonate 20.0 - 28.0 mmol/L - - - 27.9  -  O2 Saturation % - - - 58.5  -    Details       This result is from an external source.          Exercise Target Goals: Exercise Program Goal: Individual exercise prescription set using results from initial 6 min walk test and THRR  while considering  patient's activity barriers and safety.   Exercise Prescription Goal: Initial exercise prescription builds to 30-45 minutes a day of aerobic activity, 2-3 days per week.  Home exercise guidelines will be given to patient during program as part of exercise prescription that the participant will acknowledge.   Education: Aerobic Exercise: - Group verbal and visual presentation on the components of exercise prescription. Introduces F.I.T.T principle from ACSM for exercise prescriptions.  Reviews F.I.T.T. principles of aerobic exercise including progression. Written material given at graduation. Flowsheet Row Cardiac Rehab from 10/18/2022 in [facility] Cardiac and Pulmonary Rehab  Education need identified 10/17/22       Education: Resistance Exercise: - Group verbal and visual presentation on the components of exercise prescription. Introduces F.I.T.T principle from ACSM for exercise prescriptions  Reviews F.I.T.T. principles of resistance exercise including progression. Written material given at graduation.    Education: Exercise & Equipment Safety: - Individual verbal instruction and demonstration of equipment use and safety with use of the equipment. Flowsheet Row Cardiac Rehab from 10/18/2022 in [facility] Cardiac and Pulmonary Rehab  Date 10/17/22  Educator [facility]  Instruction Review Code 1- Verbalizes Understanding       Education: Exercise Physiology & General Exercise Guidelines: - Group verbal and written instruction with models to review the exercise physiology of the cardiovascular system and associated critical values. Provides general exercise guidelines with specific guidelines to those with heart or lung disease.    Education: Flexibility, Balance, Mind/Body Relaxation: - Group verbal and visual presentation with interactive activity on the components of exercise prescription. Introduces F.I.T.T principle from ACSM for exercise prescriptions. Reviews F.I.T.T. principles  of flexibility and balance exercise training including progression. Also discusses the mind body connection.  Reviews various relaxation techniques to help reduce and manage stress (i.e. Deep breathing, progressive muscle relaxation, and visualization). Balance handout provided to take home. Written material given at graduation.   Activity Barriers & Risk Stratification:  Activity Barriers & Cardiac Risk Stratification - 10/17/22 1120       Activity Barriers & Cardiac Risk Stratification   Activity Barriers Deconditioning;Muscular Weakness;Back Problems;Balance Concerns;Assistive Device;Other (comment)    Comments R hand cold and numbness, back pain with standing    Cardiac Risk Stratification High             6 Minute Walk:  6 Minute Walk     Row Name 10/17/22 1120         6 Minute Walk   Phase Initial     Distance 1180 feet     Walk Time 6 minutes     # of Rest Breaks 0     MPH 2.23     METS 2.24     RPE 11     VO2 Peak 7.85     Symptoms No     Resting HR 72 bpm     Resting BP 124/72     Resting Oxygen Saturation  99 %     Exercise Oxygen Saturation  during 6 min walk 95 %     Max Ex. HR 98 bpm     Max Ex. BP 128/64     2 Minute Post BP 124/72              Oxygen Initial Assessment:   Oxygen Re-Evaluation:   Oxygen Discharge (Final Oxygen Re-Evaluation):   Initial Exercise Prescription:  Initial Exercise Prescription - 10/17/22 1100       Date of Initial Exercise RX and Referring Provider   Date 10/17/22    Referring Provider Nelly Laurence MD      Oxygen   Maintain Oxygen Saturation 88% or higher  Treadmill   MPH 2    Grade 0.5    Minutes 15    METs 2.67      Recumbant Bike   Level 1    RPM 50    Watts 7    Minutes 15    METs 2.5      NuStep   Level 1    SPM 80    Minutes 15    METs 2.5      REL-XR   Level 1    Speed 50    Minutes 15    METs 2.5      Track   Laps 30    Minutes 15    METs 2.63      Prescription  Details   Frequency (times per week) 3    Duration Progress to 30 minutes of continuous aerobic without signs/symptoms of physical distress      Intensity   THRR 40-80% of Max Heartrate 102-132    Ratings of Perceived Exertion 11-13    Perceived Dyspnea 0-4      Progression   Progression Continue to progress workloads to maintain intensity without signs/symptoms of physical distress.      Resistance Training   Training Prescription Yes    Weight 3 lb             Perform Capillary Blood Glucose checks as needed.  Exercise Prescription Changes:   Exercise Prescription Changes     Row Name 10/17/22 1100             Response to Exercise   Blood Pressure (Admit) 124/72       Blood Pressure (Exercise) 128/64       Blood Pressure (Exit) 124/72       Heart Rate (Admit) 72 bpm       Heart Rate (Exercise) 98 bpm       Heart Rate (Exit) 79 bpm       Oxygen Saturation (Admit) 99 %       Oxygen Saturation (Exercise) 95 %       Rating of Perceived Exertion (Exercise) 11       Symptoms none       Comments walk test results                Exercise Comments:   Exercise Comments     Row Name 10/18/22 1037           Exercise Comments First full day of exercise!  Patient was oriented to gym and equipment including functions, settings, policies, and procedures.  Patient's individual exercise prescription and treatment plan were reviewed.  All starting workloads were established based on the results of the 6 minute walk test done at initial orientation visit.  The plan for exercise progression was also introduced and progression will be customized based on patient's performance and goals.                Exercise Goals and Review:   Exercise Goals     Row Name 10/17/22 1137             Exercise Goals   Increase Physical Activity Yes       Intervention Provide advice, education, support and counseling about physical activity/exercise needs.;Develop an  individualized exercise prescription for aerobic and resistive training based on initial evaluation findings, risk stratification, comorbidities and participant's personal goals.       Expected Outcomes Short Term: Attend rehab on a regular basis to increase  amount of physical activity.;Long Term: Add in home exercise to make exercise part of routine and to increase amount of physical activity.;Long Term: Exercising regularly at least 3-5 days a week.       Increase Strength and Stamina Yes       Intervention Provide advice, education, support and counseling about physical activity/exercise needs.;Develop an individualized exercise prescription for aerobic and resistive training based on initial evaluation findings, risk stratification, comorbidities and participant's personal goals.       Expected Outcomes Short Term: Increase workloads from initial exercise prescription for resistance, speed, and METs.;Short Term: Perform resistance training exercises routinely during rehab and add in resistance training at home;Long Term: Improve cardiorespiratory fitness, muscular endurance and strength as measured by increased METs and functional capacity ( )       Able to understand and use rate of perceived exertion (RPE) scale Yes       Intervention Provide education and explanation on how to use RPE scale       Expected Outcomes Long Term:  Able to use RPE to guide intensity level when exercising independently;Short Term: Able to use RPE daily in rehab to express subjective intensity level       Able to understand and use Dyspnea scale Yes       Intervention Provide education and explanation on how to use Dyspnea scale       Expected Outcomes Short Term: Able to use Dyspnea scale daily in rehab to express subjective sense of shortness of breath during exertion;Long Term: Able to use Dyspnea scale to guide intensity level when exercising independently       Knowledge and understanding of Target Heart Rate Range  (THRR) Yes       Intervention Provide education and explanation of THRR including how the numbers were predicted and where they are located for reference       Expected Outcomes Short Term: Able to state/look up THRR;Short Term: Able to use daily as guideline for intensity in rehab;Long Term: Able to use THRR to govern intensity when exercising independently       Able to check pulse independently Yes       Intervention Provide education and demonstration on how to check pulse in carotid and radial arteries.;Review the importance of being able to check your own pulse for safety during independent exercise       Expected Outcomes Short Term: Able to explain why pulse checking is important during independent exercise;Long Term: Able to check pulse independently and accurately       Understanding of Exercise Prescription Yes       Intervention Provide education, explanation, and written materials on patient's individual exercise prescription       Expected Outcomes Short Term: Able to explain program exercise prescription;Long Term: Able to explain home exercise prescription to exercise independently                Exercise Goals Re-Evaluation :  Exercise Goals Re-Evaluation     Row Name 10/18/22 1038             Exercise Goal Re-Evaluation   Exercise Goals Review Increase Physical Activity;Able to understand and use rate of perceived exertion (RPE) scale;Knowledge and understanding of Target Heart Rate Range (THRR);Understanding of Exercise Prescription;Increase Strength and Stamina;Able to check pulse independently       Comments Reviewed RPE  and dyspnea scale, THR and program prescription with pt today.  Pt voiced understanding and was given a copy of goals  to take home.       Expected Outcomes Short: Use RPE daily to regulate intensity.  Long: Follow program prescription in THR.                Discharge Exercise Prescription (Final Exercise Prescription Changes):  Exercise  Prescription Changes - 10/17/22 1100       Response to Exercise   Blood Pressure (Admit) 124/72    Blood Pressure (Exercise) 128/64    Blood Pressure (Exit) 124/72    Heart Rate (Admit) 72 bpm    Heart Rate (Exercise) 98 bpm    Heart Rate (Exit) 79 bpm    Oxygen Saturation (Admit) 99 %    Oxygen Saturation (Exercise) 95 %    Rating of Perceived Exertion (Exercise) 11    Symptoms none    Comments walk test results             Nutrition:  Target Goals: Understanding of nutrition guidelines, daily intake of sodium 1500mg , cholesterol 200mg , calories 30% from fat and 7% or less from saturated fats, daily to have 5 or more servings of fruits and vegetables.  Education: All About Nutrition: -Group instruction provided by verbal, written material, interactive activities, discussions, models, and posters to present general guidelines for heart healthy nutrition including fat, fiber, MyPlate, the role of sodium in heart healthy nutrition, utilization of the nutrition label, and utilization of this knowledge for meal planning. Follow up email sent as well. Written material given at graduation. Flowsheet Row Cardiac Rehab from 10/18/2022 in [facility] Cardiac and Pulmonary Rehab  Education need identified 10/17/22       Biometrics:  Pre Biometrics - 10/17/22 1138       Pre Biometrics   Height 5' 7.8" (1.722 m)    Weight 207 lb 11.2 oz (94.2 kg)    Waist Circumference 41 inches    Hip Circumference 41 inches    Waist to Hip Ratio 1 %    BMI (Calculated) 31.77    Single Leg Stand 2.9 seconds              Nutrition Therapy Plan and Nutrition Goals:  Nutrition Therapy & Goals - 10/17/22 1139       Intervention Plan   Intervention Prescribe, educate and counsel regarding individualized specific dietary modifications aiming towards targeted core components such as weight, hypertension, lipid management, diabetes, heart failure and other comorbidities.    Expected Outcomes Short  Term Goal: Understand basic principles of dietary content, such as calories, fat, sodium, cholesterol and nutrients.;Short Term Goal: A plan has been developed with personal nutrition goals set during dietitian appointment.;Long Term Goal: Adherence to prescribed nutrition plan.             Nutrition Assessments:  MEDIFICTS Score Key: ?70 Need to make dietary changes  40-70 Heart Healthy Diet ? 40 Therapeutic Level Cholesterol Diet  Flowsheet Row Cardiac Rehab from 10/17/2022 in [facility] Cardiac and Pulmonary Rehab  Picture Your Plate Total Score on Admission 39      Picture Your Plate Scores: <16 Unhealthy dietary pattern with much room for improvement. 41-50 Dietary pattern unlikely to meet recommendations for good health and room for improvement. 51-60 More healthful dietary pattern, with some room for improvement.  >60 Healthy dietary pattern, although there may be some specific behaviors that could be improved.    Nutrition Goals Re-Evaluation:   Nutrition Goals Discharge (Final Nutrition Goals Re-Evaluation):   Psychosocial: Target Goals: Acknowledge presence or absence of significant depression and/or  stress, maximize coping skills, provide positive support system. Participant is able to verbalize types and ability to use techniques and skills needed for reducing stress and depression.   Education: Stress, Anxiety, and Depression - Group verbal and visual presentation to define topics covered.  Reviews how body is impacted by stress, anxiety, and depression.  Also discusses healthy ways to reduce stress and to treat/manage anxiety and depression.  Written material given at graduation.   Education: Sleep Hygiene -Provides group verbal and written instruction about how sleep can affect your health.  Define sleep hygiene, discuss sleep cycles and impact of sleep habits. Review good sleep hygiene tips.    Initial Review & Psychosocial Screening:  Initial Psych Review &  Screening - 10/11/22 1344       Initial Review   Current issues with Current Anxiety/Panic;Current Psychotropic Meds;Current Depression;History of Depression      Family Dynamics   Good Support System? Yes    Comments He can look to his home nurse for support. His wife is a good supporty system and his son comes by three times a week.      Barriers   Psychosocial barriers to participate in program The patient should benefit from training in stress management and relaxation.;There are no identifiable barriers or psychosocial needs.      Screening Interventions   Interventions To provide support and resources with identified psychosocial needs;Provide feedback about the scores to participant    Expected Outcomes Short Term goal: Utilizing psychosocial counselor, staff and physician to assist with identification of specific Stressors or current issues interfering with healing process. Setting desired goal for each stressor or current issue identified.;Long Term Goal: Stressors or current issues are controlled or eliminated.;Short Term goal: Identification and review with participant of any Quality of Life or Depression concerns found by scoring the questionnaire.;Long Term goal: The participant improves quality of Life and PHQ9 Scores as seen by post scores and/or verbalization of changes             Quality of Life Scores:   Quality of Life - 10/17/22 1139       Quality of Life   Select Quality of Life      Quality of Life Scores   Health/Function Pre 19.67 %    Socioeconomic Pre 26.4 %    Psych/Spiritual Pre 25.71 %    Family Pre 28.8 %    GLOBAL Pre 23.47 %            Scores of 19 and below usually indicate a poorer quality of life in these areas.  A difference of  2-3 points is a clinically meaningful difference.  A difference of 2-3 points in the total score of the Quality of Life Index has been associated with significant improvement in overall quality of life,  self-image, physical symptoms, and general health in studies assessing change in quality of life.  PHQ-9: Review Flowsheet  More data exists      10/17/2022 11/25/2018 05/29/2016 03/14/2016 12/06/2015  Depression screen PHQ 2/9  Decreased Interest 0 0 0 0 0  Down, Depressed, Hopeless 1 0 0 0 0  PHQ - 2 Score 1 0 0 0 0  Altered sleeping 0 0 - - -  Tired, decreased energy 1 0 - - -  Change in appetite 0 0 - - -  Feeling bad or failure about yourself  1 0 - - -  Trouble concentrating 1 0 - - -  Moving slowly or fidgety/restless 2  0 - - -  Suicidal thoughts 0 0 - - -  PHQ-9 Score 6 0 - - -  Difficult doing work/chores Somewhat difficult Not difficult at all - - -   Interpretation of Total Score  Total Score Depression Severity:  1-4 = Minimal depression, 5-9 = Mild depression, 10-14 = Moderate depression, 15-19 = Moderately severe depression, 20-27 = Severe depression   Psychosocial Evaluation and Intervention:  Psychosocial Evaluation - 10/11/22 1346       Psychosocial Evaluation & Interventions   Interventions Encouraged to exercise with the program and follow exercise prescription;Relaxation education;Stress management education    Comments He can look to his home nurse for support. His wife is a good supporty system and his son comes by three times a week.    Expected Outcomes Short: Start HeartTrack to help with mood. Long: Maintain a healthy mental state    Continue Psychosocial Services  Follow up required by staff             Psychosocial Re-Evaluation:   Psychosocial Discharge (Final Psychosocial Re-Evaluation):   Vocational Rehabilitation: Provide vocational rehab assistance to qualifying candidates.   Vocational Rehab Evaluation & Intervention:   Education: Education Goals: Education classes will be provided on a variety of topics geared toward better understanding of heart health and risk factor modification. Participant will state understanding/return  demonstration of topics presented as noted by education test scores.  Learning Barriers/Preferences:  Learning Barriers/Preferences - 10/11/22 1343       Learning Barriers/Preferences   Learning Barriers None    Learning Preferences None             General Cardiac Education Topics:  AED/CPR: - Group verbal and written instruction with the use of models to demonstrate the basic use of the AED with the basic ABC's of resuscitation.   Anatomy and Cardiac Procedures: - Group verbal and visual presentation and models provide information about basic cardiac anatomy and function. Reviews the testing methods done to diagnose heart disease and the outcomes of the test results. Describes the treatment choices: Medical Management, Angioplasty, or Coronary Bypass Surgery for treating various heart conditions including Myocardial Infarction, Angina, Valve Disease, and Cardiac Arrhythmias.  Written material given at graduation. Flowsheet Row Cardiac Rehab from 10/18/2022 in [facility] Cardiac and Pulmonary Rehab  Education need identified 10/17/22       Medication Safety: - Group verbal and visual instruction to review commonly prescribed medications for heart and lung disease. Reviews the medication, class of the drug, and side effects. Includes the steps to properly store meds and maintain the prescription regimen.  Written material given at graduation.   Intimacy: - Group verbal instruction through game format to discuss how heart and lung disease can affect sexual intimacy. Written material given at graduation..   Know Your Numbers and Heart Failure: - Group verbal and visual instruction to discuss disease risk factors for cardiac and pulmonary disease and treatment options.  Reviews associated critical values for Overweight/Obesity, Hypertension, Cholesterol, and Diabetes.  Discusses basics of heart failure: signs/symptoms and treatments.  Introduces Heart Failure Zone chart for action plan  for heart failure.  Written material given at graduation. Flowsheet Row Cardiac Rehab from 10/18/2022 in [facility] Cardiac and Pulmonary Rehab  Education need identified 10/17/22       Infection Prevention: - Provides verbal and written material to individual with discussion of infection control including proper hand washing and proper equipment cleaning during exercise session. Flowsheet Row Cardiac Rehab from 10/18/2022 in  [facility] Cardiac and Pulmonary Rehab  Date 10/17/22  Educator [facility]  Instruction Review Code 1- Verbalizes Understanding       Falls Prevention: - Provides verbal and written material to individual with discussion of falls prevention and safety. Flowsheet Row Cardiac Rehab from 10/18/2022 in [facility] Cardiac and Pulmonary Rehab  Date 10/17/22  Educator [facility]  Instruction Review Code 1- Verbalizes Understanding       Other: -Provides group and verbal instruction on various topics (see comments) Flowsheet Row Cardiac Rehab from 10/18/2022 in [facility] Cardiac and Pulmonary Rehab  Date 10/18/22  Educator [facility]  Instruction Review Code 1- Verbalizes Understanding       Knowledge Questionnaire Score:  Knowledge Questionnaire Score - 10/17/22 1140       Knowledge Questionnaire Score   Pre Score 20/26             Core Components/Risk Factors/Patient Goals at Admission:  Personal Goals and Risk Factors at Admission - 10/17/22 1140       Core Components/Risk Factors/Patient Goals on Admission    Weight Management Yes;Weight Loss;Obesity    Intervention Weight Management: Develop a combined nutrition and exercise program designed to reach desired caloric intake, while maintaining appropriate intake of nutrient and fiber, sodium and fats, and appropriate energy expenditure required for the weight goal.;Weight Management: Provide education and appropriate resources to help participant work on and attain dietary goals.;Weight Management/Obesity: Establish reasonable short  term and long term weight goals.;Obesity: Provide education and appropriate resources to help participant work on and attain dietary goals.    Admit Weight 207 lb 11.2 oz (94.2 kg)    Goal Weight: Short Term 202 lb (91.6 kg)    Goal Weight: Long Term 197 lb (89.4 kg)    Expected Outcomes Short Term: Continue to assess and modify interventions until short term weight is achieved;Long Term: Adherence to nutrition and physical activity/exercise program aimed toward attainment of established weight goal;Weight Loss: Understanding of general recommendations for a balanced deficit meal plan, which promotes 1-2 lb weight loss per week and includes a negative energy balance of [phone] kcal/d;Understanding recommendations for meals to include 15-35% energy as protein, 25-35% energy from fat, 35-60% energy from carbohydrates, less than 200mg  of dietary cholesterol, 20-35 gm of total fiber daily;Understanding of distribution of calorie intake throughout the day with the consumption of 4-5 meals/snacks    Diabetes Yes    Intervention Provide education about signs/symptoms and action to take for hypo/hyperglycemia.;Provide education about proper nutrition, including hydration, and aerobic/resistive exercise prescription along with prescribed medications to achieve blood glucose in normal ranges: Fasting glucose 65-99 mg/dL    Expected Outcomes Short Term: Participant verbalizes understanding of the signs/symptoms and immediate care of hyper/hypoglycemia, proper foot care and importance of medication, aerobic/resistive exercise and nutrition plan for blood glucose control.;Long Term: Attainment of HbA1C < 7%.    Heart Failure Yes    Intervention Provide a combined exercise and nutrition program that is supplemented with education, support and counseling about heart failure. Directed toward relieving symptoms such as shortness of breath, decreased exercise tolerance, and extremity edema.    Expected Outcomes Improve  functional capacity of life;Short term: Attendance in program 2-3 days a week with increased exercise capacity. Reported lower sodium intake. Reported increased fruit and vegetable intake. Reports medication compliance.;Short term: Daily weights obtained and reported for increase. Utilizing diuretic protocols set by physician.;Long term: Adoption of self-care skills and reduction of barriers for early signs and symptoms recognition and intervention leading to  self-care maintenance.    Hypertension Yes    Intervention Provide education on lifestyle modifcations including regular physical activity/exercise, weight management, moderate sodium restriction and increased consumption of fresh fruit, vegetables, and low fat dairy, alcohol moderation, and smoking cessation.;Monitor prescription use compliance.    Expected Outcomes Short Term: Continued assessment and intervention until BP is < 140/68mm HG in hypertensive participants. < 130/42mm HG in hypertensive participants with diabetes, heart failure or chronic kidney disease.;Long Term: Maintenance of blood pressure at goal levels.    Lipids Yes    Intervention Provide education and support for participant on nutrition & aerobic/resistive exercise along with prescribed medications to achieve LDL 70mg , HDL >40mg .    Expected Outcomes Short Term: Participant states understanding of desired cholesterol values and is compliant with medications prescribed. Participant is following exercise prescription and nutrition guidelines.;Long Term: Cholesterol controlled with medications as prescribed, with individualized exercise RX and with personalized nutrition plan. Value goals: LDL < 70mg , HDL > 40 mg.             Education:Diabetes - Individual verbal and written instruction to review signs/symptoms of diabetes, desired ranges of glucose level fasting, after meals and with exercise. Acknowledge that pre and post exercise glucose checks will be done for 3  sessions at entry of program. Flowsheet Row Cardiac Rehab from 10/18/2022 in [facility] Cardiac and Pulmonary Rehab  Date 10/17/22  Educator [facility]  Instruction Review Code 1- Verbalizes Understanding       Core Components/Risk Factors/Patient Goals Review:    Core Components/Risk Factors/Patient Goals at Discharge (Final Review):    ITP Comments:  ITP Comments     Row Name 10/11/22 1346 10/17/22 1120 10/18/22 1037 10/18/22 1154     ITP Comments Virtual Visit completed. Patient informed on EP and RD appointment and 6 Minute walk test. Patient also informed of patient health questionnaires on My Chart. Patient Verbalizes understanding. Visit diagnosis can be found in [facility] 07/22/2022. Completed and gym orientation. Initial ITP created and sent for review to Dr. Bethann Punches, Medical Director. First full day of exercise!  Patient was oriented to gym and equipment including functions, settings, policies, and procedures.  Patient's individual exercise prescription and treatment plan were reviewed.  All starting workloads were established based on the results of the 6 minute walk test done at initial orientation visit.  The plan for exercise progression was also introduced and progression will be customized based on patient's performance and goals. 30 Day review completed. Medical Director ITP review done, changes made as directed, and signed approval by Medical Director.   new to program             Comments:

## 2022-10-25 ENCOUNTER — Encounter: Payer: No Typology Code available for payment source | Admitting: *Deleted

## 2022-10-25 DIAGNOSIS — I5022 Chronic systolic (congestive) heart failure: Secondary | ICD-10-CM | POA: Diagnosis not present

## 2022-10-25 LAB — GLUCOSE, CAPILLARY
Glucose-Capillary: 196 mg/dL — ABNORMAL HIGH (ref 70–99)
Glucose-Capillary: 158 mg/dL — ABNORMAL HIGH (ref 70–99)

## 2022-10-25 NOTE — Progress Notes (Signed)
Daily Session Note  Patient Details  Name: Ian Lucas MRN: [id number] Date of Birth: [birth year] Referring Provider:   Flowsheet Row Cardiac Rehab from 10/17/2022 in [facility] Cardiac and Pulmonary Rehab  Referring Provider Nelly Laurence MD       Encounter Date: 10/25/2022  Check In:  Session Check In - 10/25/22 1117       Check-In   Supervising physician immediately available to respond to emergencies See telemetry face sheet for immediately available ER MD    Location [facility]-Cardiac & Pulmonary Rehab    Staff Present Lanny Hurst, RN, ADN;Meredith Jewel Baize, RN BSN;Kristen Coble, RN,BC,MSN;Joseph Reino [place], [place]    Virtual Visit No    Medication changes reported     No    Fall or balance concerns reported    No    Warm-up and Cool-down Performed on first and last piece of equipment    Resistance Training Performed Yes    VAD Patient? No    PAD/SET Patient? No      Pain Assessment   Currently in Pain? No/denies                Social History   Tobacco Use  Smoking Status Former   Packs/day: 0.25   Years: 30.00   Additional pack years: 0.00   Total pack years: 7.50   Types: Cigarettes   Start date: 05/08/1968   Quit date: 06/17/1998   Years since quitting: 24.3  Smokeless Tobacco Never    Goals Met:  Independence with exercise equipment Exercise tolerated well No report of concerns or symptoms today Strength training completed today  Goals Unmet:  Not Applicable  Comments: Pt able to follow exercise prescription today without complaint.  Will continue to monitor for progression.    Dr. Bethann Punches is Medical Director for [facility] Cardiac Rehabilitation.  Dr. Vida Rigger is Medical Director for [facility] Pulmonary Rehabilitation.

## 2022-10-27 ENCOUNTER — Encounter: Payer: No Typology Code available for payment source | Admitting: *Deleted

## 2022-10-27 DIAGNOSIS — I5022 Chronic systolic (congestive) heart failure: Secondary | ICD-10-CM

## 2022-10-27 LAB — GLUCOSE, CAPILLARY
Glucose-Capillary: 213 mg/dL — ABNORMAL HIGH (ref 70–99)
Glucose-Capillary: 156 mg/dL — ABNORMAL HIGH (ref 70–99)

## 2022-10-27 NOTE — Progress Notes (Signed)
Daily Session Note  Patient Details  Name: Ian Lucas MRN: [id number] Date of Birth: March 09, 1951 Referring Provider:   Flowsheet Row Cardiac Rehab from 10/17/2022 in [facility] Cardiac and Pulmonary Rehab  Referring Provider Nelly Laurence MD       Encounter Date: 10/27/2022  Check In:  Session Check In - 10/27/22 1151       Check-In   Supervising physician immediately available to respond to emergencies See telemetry face sheet for immediately available ER MD    Location [facility]-Cardiac & Pulmonary Rehab    Staff Present Cora Collum, RN, BSN, CCRP;Megan Katrinka Blazing, RN, [place];Other   [place] HFS   Virtual Visit No    Medication changes reported     No    Fall or balance concerns reported    No    Warm-up and Cool-down Performed on first and last piece of equipment    Resistance Training Performed Yes    VAD Patient? No    PAD/SET Patient? No      Pain Assessment   Currently in Pain? No/denies                Social History   Tobacco Use  Smoking Status Former   Packs/day: 0.25   Years: 30.00   Additional pack years: 0.00   Total pack years: 7.50   Types: Cigarettes   Start date: 05/08/1968   Quit date: 06/17/1998   Years since quitting: 24.3  Smokeless Tobacco Never    Goals Met:  Independence with exercise equipment Exercise tolerated well No report of concerns or symptoms today  Goals Unmet:  Not Applicable  Comments: Pt able to follow exercise prescription today without complaint.  Will continue to monitor for progression.    Dr. Bethann Punches is Medical Director for [facility] Cardiac Rehabilitation.  Dr. Vida Rigger is Medical Director for [facility] Pulmonary Rehabilitation.

## 2022-10-30 ENCOUNTER — Encounter: Payer: No Typology Code available for payment source | Admitting: *Deleted

## 2022-10-30 DIAGNOSIS — I5022 Chronic systolic (congestive) heart failure: Secondary | ICD-10-CM

## 2022-10-30 NOTE — Progress Notes (Signed)
Daily Session Note  Patient Details  Name: Ian Lucas MRN: [id number] Date of Birth: [birth year] Referring Provider:   Flowsheet Row Cardiac Rehab from 10/17/2022 in [facility] Cardiac and Pulmonary Rehab  Referring Provider Nelly Laurence MD       Encounter Date: 10/30/2022  Check In:  Session Check In - 10/30/22 1126       Check-In   Supervising physician immediately available to respond to emergencies See telemetry face sheet for immediately available ER MD    Location [facility]-Cardiac & Pulmonary Rehab    Staff Present Lanny Hurst, RN, Franki Monte, BS, ACSM CEP, Exercise Physiologist;Meredith Jewel Baize, RN BSN;Kristen Coble, RN,BC,MSN    Virtual Visit No    Medication changes reported     No    Fall or balance concerns reported    No    Warm-up and Cool-down Performed on first and last piece of equipment    Resistance Training Performed Yes    VAD Patient? No    PAD/SET Patient? No      Pain Assessment   Currently in Pain? No/denies                Social History   Tobacco Use  Smoking Status Former   Packs/day: 0.25   Years: 30.00   Additional pack years: 0.00   Total pack years: 7.50   Types: Cigarettes   Start date: 05/08/1968   Quit date: 06/17/1998   Years since quitting: 24.3  Smokeless Tobacco Never    Goals Met:  Independence with exercise equipment Exercise tolerated well No report of concerns or symptoms today Strength training completed today  Goals Unmet:  Not Applicable  Comments: Pt able to follow exercise prescription today without complaint.  Will continue to monitor for progression.    Dr. Bethann Punches is Medical Director for [facility] Cardiac Rehabilitation.  Dr. Vida Rigger is Medical Director for [facility] Pulmonary Rehabilitation.

## 2022-11-06 ENCOUNTER — Encounter: Payer: No Typology Code available for payment source | Admitting: *Deleted

## 2022-11-06 DIAGNOSIS — I5022 Chronic systolic (congestive) heart failure: Secondary | ICD-10-CM | POA: Insufficient documentation

## 2022-11-06 NOTE — Progress Notes (Signed)
Daily Session Note  Patient Details  Name: FINDLAY MUNSEY MRN: [id number] Date of Birth: 1950/08/06 Referring Provider:   Flowsheet Row Cardiac Rehab from 10/17/2022 in [facility] Cardiac and Pulmonary Rehab  Referring Provider Nelly Laurence MD       Encounter Date: 11/06/2022  Check In:  Session Check In - 11/06/22 1059       Check-In   Supervising physician immediately available to respond to emergencies See telemetry face sheet for immediately available ER MD    Location [facility]-Cardiac & Pulmonary Rehab    Staff Present Lanny Hurst, RN, ADN;Meredith Jewel Baize, RN BSN;Denise Rhew, PhD, RN, CNS, CEN;Other   [place], [place]   Virtual Visit No    Medication changes reported     No    Fall or balance concerns reported    No    Warm-up and Cool-down Performed on first and last piece of equipment    Resistance Training Performed Yes    VAD Patient? No    PAD/SET Patient? No      Pain Assessment   Currently in Pain? No/denies                Social History   Tobacco Use  Smoking Status Former   Packs/day: 0.25   Years: 30.00   Additional pack years: 0.00   Total pack years: 7.50   Types: Cigarettes   Start date: 05/08/1968   Quit date: 06/17/1998   Years since quitting: 24.4  Smokeless Tobacco Never    Goals Met:  Independence with exercise equipment Exercise tolerated well Personal goals reviewed No report of concerns or symptoms today Strength training completed today  Goals Unmet:  Not Applicable  Comments: Pt able to follow exercise prescription today without complaint.  Will continue to monitor for progression.    Dr. Bethann Punches is Medical Director for [facility] Cardiac Rehabilitation.  Dr. Vida Rigger is Medical Director for [facility] Pulmonary Rehabilitation.

## 2022-11-08 ENCOUNTER — Encounter: Payer: No Typology Code available for payment source | Admitting: *Deleted

## 2022-11-08 DIAGNOSIS — I5022 Chronic systolic (congestive) heart failure: Secondary | ICD-10-CM

## 2022-11-08 NOTE — Progress Notes (Signed)
Daily Session Note  Patient Details  Name: Ian Lucas MRN: [id number] Date of Birth: 22-May-1950 Referring Provider:   Flowsheet Row Cardiac Rehab from 10/17/2022 in [facility] Cardiac and Pulmonary Rehab  Referring Provider Nelly Laurence MD       Encounter Date: 11/08/2022  Check In:  Session Check In - 11/08/22 1125       Check-In   Supervising physician immediately available to respond to emergencies See telemetry face sheet for immediately available ER MD    Location [facility]-Cardiac & Pulmonary Rehab    Staff Present Darcel Bayley, RN,BC,MSN;Joseph Shelbie Proctor, RN, ADN;Meredith Jewel Baize, RN BSN    Virtual Visit No    Medication changes reported     No    Fall or balance concerns reported    No    Warm-up and Cool-down Performed on first and last piece of equipment    Resistance Training Performed Yes    VAD Patient? No    PAD/SET Patient? No      Pain Assessment   Currently in Pain? No/denies                Social History   Tobacco Use  Smoking Status Former   Packs/day: 0.25   Years: 30.00   Additional pack years: 0.00   Total pack years: 7.50   Types: Cigarettes   Start date: 05/08/1968   Quit date: 06/17/1998   Years since quitting: 24.4  Smokeless Tobacco Never    Goals Met:  Independence with exercise equipment Exercise tolerated well No report of concerns or symptoms today Strength training completed today  Goals Unmet:  Not Applicable  Comments: Pt able to follow exercise prescription today without complaint.  Will continue to monitor for progression.    Dr. Bethann Punches is Medical Director for [facility] Cardiac Rehabilitation.  Dr. Vida Rigger is Medical Director for [facility] Pulmonary Rehabilitation.

## 2022-11-10 ENCOUNTER — Encounter: Payer: No Typology Code available for payment source | Admitting: *Deleted

## 2022-11-10 DIAGNOSIS — I5022 Chronic systolic (congestive) heart failure: Secondary | ICD-10-CM

## 2022-11-10 NOTE — Progress Notes (Signed)
Daily Session Note  Patient Details  Name: Ian Lucas MRN: [id number] Date of Birth: 17-Dec-1950 Referring Provider:   Flowsheet Row Cardiac Rehab from 10/17/2022 in [facility] Cardiac and Pulmonary Rehab  Referring Provider Nelly Laurence MD       Encounter Date: 11/10/2022  Check In:  Session Check In - 11/10/22 1152       Check-In   Supervising physician immediately available to respond to emergencies See telemetry face sheet for immediately available ER MD    Location [facility]-Cardiac & Pulmonary Rehab    Staff Present Cora Collum, RN, BSN, CCRP;Noah Tickle, BS, Exercise Physiologist;Other   [place] HFS   Virtual Visit No    Medication changes reported     No    Fall or balance concerns reported    No    Warm-up and Cool-down Performed on first and last piece of equipment    Resistance Training Performed Yes    VAD Patient? No    PAD/SET Patient? No      Pain Assessment   Currently in Pain? No/denies                Social History   Tobacco Use  Smoking Status Former   Packs/day: 0.25   Years: 30.00   Additional pack years: 0.00   Total pack years: 7.50   Types: Cigarettes   Start date: 05/08/1968   Quit date: 06/17/1998   Years since quitting: 24.4  Smokeless Tobacco Never    Goals Met:  Independence with exercise equipment Exercise tolerated well No report of concerns or symptoms today  Goals Unmet:  Not Applicable  Comments: Pt able to follow exercise prescription today without complaint.  Will continue to monitor for progression.    Dr. Bethann Punches is Medical Director for [facility] Cardiac Rehabilitation.  Dr. Vida Rigger is Medical Director for [facility] Pulmonary Rehabilitation.

## 2022-11-14 ENCOUNTER — Encounter: Payer: Self-pay | Admitting: *Deleted

## 2022-11-14 DIAGNOSIS — I5022 Chronic systolic (congestive) heart failure: Secondary | ICD-10-CM

## 2022-11-14 NOTE — Progress Notes (Signed)
Cardiac Individual Treatment Plan  Patient Details  Name: Ian Lucas MRN: [id number] Date of Birth: 1950-08-28 Referring Provider:   Flowsheet Row Cardiac Rehab from 10/17/2022 in [facility] Cardiac and Pulmonary Rehab  Referring Provider Nelly Laurence MD       Initial Encounter Date:  Flowsheet Row Cardiac Rehab from 10/17/2022 in [facility] Cardiac and Pulmonary Rehab  Date 10/17/22       Visit Diagnosis: Heart failure, chronic systolic (HCC)  Patient's Home Medications on Admission:  Current Outpatient Medications:    albuterol (VENTOLIN HFA) 108 (90 Base) MCG/ACT inhaler, Inhale 2 puffs into the lungs every 6 (six) hours as needed for wheezing or shortness of breath., Disp: 6.7 g, Rfl: 0   Alogliptin Benzoate 25 MG TABS, Take 25 mg by mouth daily., Disp: , Rfl:    amLODipine (NORVASC) 10 MG tablet, Take 10 mg by mouth daily. , Disp: , Rfl:    carbamide peroxide (DEBROX) 6.5 % OTIC solution, Place in ear(s)., Disp: , Rfl:    carvedilol (COREG) 12.5 MG tablet, Take 1 tablet (12.5 mg total) by mouth 2 (two) times daily with a meal. (Patient not taking: Reported on 10/11/2022), Disp: 60 tablet, Rfl: 1   carvedilol (COREG) 25 MG tablet, Take by mouth., Disp: , Rfl:    Cholecalciferol 50 MCG (2000 UT) TABS, Take by mouth., Disp: , Rfl:    donepezil (ARICEPT) 10 MG tablet, Take 5 mg by mouth in the morning. (Patient not taking: Reported on 10/11/2022), Disp: , Rfl:    DULoxetine (CYMBALTA) 60 MG capsule, Take 60 mg by mouth daily. , Disp: , Rfl:    EPINEPHrine 0.3 mg/0.3 mL IJ SOAJ injection, Inject 0.3 mg into the muscle as needed for anaphylaxis., Disp: 1 each, Rfl: 1   ergocalciferol (VITAMIN D2) 1.25 MG (50000 UT) capsule, Take 50,000 Units by mouth once a week., Disp: , Rfl:    famotidine (PEPCID) 20 MG tablet, Take 20 mg by mouth daily., Disp: , Rfl:    fluticasone-salmeterol (ADVAIR HFA) 115-21 MCG/ACT inhaler, Inhale 2 puffs into the lungs 2 (two) times daily., Disp: 1 each, Rfl: 12    hydrOXYzine (ATARAX) 10 MG tablet, Take 1 tablet (10 mg total) by mouth 3 (three) times daily as needed for anxiety., Disp: 30 tablet, Rfl: 0   ibuprofen (ADVIL,MOTRIN) 600 MG tablet, Take 1 tablet (600 mg total) by mouth every 6 (six) hours as needed. (Patient not taking: Reported on 10/11/2022), Disp: 30 tablet, Rfl: 0   ipratropium-albuterol (DUONEB) 0.5-2.5 (3) MG/3ML SOLN, Inhale into the lungs., Disp: , Rfl:    irbesartan (AVAPRO) 150 MG tablet, Take 1 tablet (150 mg total) by mouth daily. (Patient not taking: Reported on 10/11/2022), Disp: 30 tablet, Rfl: 1   irbesartan (AVAPRO) 300 MG tablet, Take by mouth., Disp: , Rfl:    Melatonin-Pyridoxine (MELATONIN-B6 PO), Take 1 tablet by mouth at bedtime., Disp: , Rfl:    meloxicam (MOBIC) 15 MG tablet, Take by mouth., Disp: , Rfl:    memantine (NAMENDA) 10 MG tablet, Take 10 mg by mouth daily. (Patient not taking: Reported on 10/11/2022), Disp: , Rfl:    memantine (NAMENDA) 10 MG tablet, Take 1 tablet by mouth every morning., Disp: , Rfl:    metFORMIN (GLUCOPHAGE) 500 MG tablet, Take 500 mg by mouth daily with breakfast., Disp: , Rfl:    polyethylene glycol (MIRALAX / GLYCOLAX) 17 g packet, Take 17 g by mouth daily as needed., Disp: 14 each, Rfl: 0   potassium chloride (KLOR-CON)  20 MEQ packet, Take 40 mEq by mouth daily for 7 days., Disp: 14 packet, Rfl: 0   pregabalin (LYRICA) 150 MG capsule, Take 1 capsule by mouth at bedtime., Disp: , Rfl:    pregabalin (LYRICA) 300 MG capsule, Take 200 mg by mouth 2 (two) times daily. (Patient not taking: Reported on 10/11/2022), Disp: , Rfl:    pyridOXINE (B-6) 50 MG tablet, Take by mouth., Disp: , Rfl:    rosuvastatin (CRESTOR) 10 MG tablet, Take 10 mg by mouth daily., Disp: , Rfl:    Semaglutide,0.25 or 0.5MG /DOS, 2 MG/3ML SOPN, Inject into the skin. (Patient not taking: Reported on 10/11/2022), Disp: , Rfl:    senna-docusate (SENOKOT-S) 8.6-50 MG tablet, Take by mouth., Disp: , Rfl:    Spacer/Aero-Holding Chambers  (AEROCHAMBER MV) inhaler, Use as instructed, Disp: 1 each, Rfl: 2   spironolactone (ALDACTONE) 25 MG tablet, Take by mouth., Disp: , Rfl:    tamsulosin (FLOMAX) 0.4 MG CAPS capsule, Take 0.4 mg by mouth 2 (two) times a day. , Disp: , Rfl:    tiotropium (SPIRIVA) 18 MCG inhalation capsule, Place 18 mcg into inhaler and inhale daily., Disp: , Rfl:    traZODone (DESYREL) 50 MG tablet, Take 1 tablet (50 mg total) by mouth at bedtime., Disp: 30 tablet, Rfl: 0  Past Medical History: Past Medical History:  Diagnosis Date   AKI (acute kidney injury) (HCC) 07/26/2022   Arthritis    Rheumatoid   Complication of anesthesia    has become combative following surgery   Diabetes mellitus without complication (HCC)    Type 2   History of kidney stones    x 2   History of shingles    Hypercholesteremia    Hypertension    Neuropathy    Pancreatitis    PTSD (post-traumatic stress disorder)    Sleep apnea    CPAP machine   Vertigo    occasional    Tobacco Use: Social History   Tobacco Use  Smoking Status Former   Packs/day: 0.25   Years: 30.00   Additional pack years: 0.00   Total pack years: 7.50   Types: Cigarettes   Start date: 05/08/1968   Quit date: 06/17/1998   Years since quitting: 24.4  Smokeless Tobacco Never    Labs: Review Flowsheet  More data exists      Latest Ref Rng & Units 03/14/2016 08/21/2018 11/15/2018 07/22/2022 07/26/2022  Labs for ITP Cardiac and Pulmonary Rehab  Cholestrol 0 - 200 mg/dL - 161     096  - -  LDL (calc) 0 - 99 mg/dL - 045     409  - -  HDL-C >40 mg/dL - 67     62  - -  Trlycerides <150 mg/dL - 811     57  - -  Hemoglobin A1c 4.8 - 5.6 % 6.8  6.7     6.0  - 8.7   Bicarbonate 20.0 - 28.0 mmol/L - - - 27.9  -  O2 Saturation % - - - 58.5  -    Details       This result is from an external source.          Exercise Target Goals: Exercise Program Goal: Individual exercise prescription set using results from initial 6 min walk test and THRR  while considering  patient's activity barriers and safety.   Exercise Prescription Goal: Initial exercise prescription builds to 30-45 minutes a day of aerobic activity, 2-3 days per week.  Home exercise guidelines will be given to patient during program as part of exercise prescription that the participant will acknowledge.   Education: Aerobic Exercise: - Group verbal and visual presentation on the components of exercise prescription. Introduces F.I.T.T principle from ACSM for exercise prescriptions.  Reviews F.I.T.T. principles of aerobic exercise including progression. Written material given at graduation. Flowsheet Row Cardiac Rehab from 11/08/2022 in [facility] Cardiac and Pulmonary Rehab  Education need identified 10/17/22       Education: Resistance Exercise: - Group verbal and visual presentation on the components of exercise prescription. Introduces F.I.T.T principle from ACSM for exercise prescriptions  Reviews F.I.T.T. principles of resistance exercise including progression. Written material given at graduation. Flowsheet Row Cardiac Rehab from 11/08/2022 in [facility] Cardiac and Pulmonary Rehab  Date 10/25/22  Educator NT  Instruction Review Code 1- Verbalizes Understanding        Education: Exercise & Equipment Safety: - Individual verbal instruction and demonstration of equipment use and safety with use of the equipment. Flowsheet Row Cardiac Rehab from 11/08/2022 in [facility] Cardiac and Pulmonary Rehab  Date 10/17/22  Educator [facility]  Instruction Review Code 1- Verbalizes Understanding       Education: Exercise Physiology & General Exercise Guidelines: - Group verbal and written instruction with models to review the exercise physiology of the cardiovascular system and associated critical values. Provides general exercise guidelines with specific guidelines to those with heart or lung disease.    Education: Flexibility, Balance, Mind/Body Relaxation: - Group verbal and visual  presentation with interactive activity on the components of exercise prescription. Introduces F.I.T.T principle from ACSM for exercise prescriptions. Reviews F.I.T.T. principles of flexibility and balance exercise training including progression. Also discusses the mind body connection.  Reviews various relaxation techniques to help reduce and manage stress (i.e. Deep breathing, progressive muscle relaxation, and visualization). Balance handout provided to take home. Written material given at graduation. Flowsheet Row Cardiac Rehab from 11/08/2022 in [facility] Cardiac and Pulmonary Rehab  Date 10/25/22  Educator NT  Instruction Review Code 1- Verbalizes Understanding       Activity Barriers & Risk Stratification:  Activity Barriers & Cardiac Risk Stratification - 10/17/22 1120       Activity Barriers & Cardiac Risk Stratification   Activity Barriers Deconditioning;Muscular Weakness;Back Problems;Balance Concerns;Assistive Device;Other (comment)    Comments R hand cold and numbness, back pain with standing    Cardiac Risk Stratification High             6 Minute Walk:  6 Minute Walk     Row Name 10/17/22 1120         6 Minute Walk   Phase Initial     Distance 1180 feet     Walk Time 6 minutes     # of Rest Breaks 0     MPH 2.23     METS 2.24     RPE 11     VO2 Peak 7.85     Symptoms No     Resting HR 72 bpm     Resting BP 124/72     Resting Oxygen Saturation  99 %     Exercise Oxygen Saturation  during 6 min walk 95 %     Max Ex. HR 98 bpm     Max Ex. BP 128/64     2 Minute Post BP 124/72              Oxygen Initial Assessment:   Oxygen Re-Evaluation:   Oxygen Discharge (Final Oxygen  Re-Evaluation):   Initial Exercise Prescription:  Initial Exercise Prescription - 10/17/22 1100       Date of Initial Exercise RX and Referring Provider   Date 10/17/22    Referring Provider Nelly Laurence MD      Oxygen   Maintain Oxygen Saturation 88% or higher       Treadmill   MPH 2    Grade 0.5    Minutes 15    METs 2.67      Recumbant Bike   Level 1    RPM 50    Watts 7    Minutes 15    METs 2.5      NuStep   Level 1    SPM 80    Minutes 15    METs 2.5      REL-XR   Level 1    Speed 50    Minutes 15    METs 2.5      Track   Laps 30    Minutes 15    METs 2.63      Prescription Details   Frequency (times per week) 3    Duration Progress to 30 minutes of continuous aerobic without signs/symptoms of physical distress      Intensity   THRR 40-80% of Max Heartrate 102-132    Ratings of Perceived Exertion 11-13    Perceived Dyspnea 0-4      Progression   Progression Continue to progress workloads to maintain intensity without signs/symptoms of physical distress.      Resistance Training   Training Prescription Yes    Weight 3 lb             Perform Capillary Blood Glucose checks as needed.  Exercise Prescription Changes:   Exercise Prescription Changes     Row Name 10/17/22 1100 11/01/22 0800           Response to Exercise   Blood Pressure (Admit) 124/72 122/68      Blood Pressure (Exercise) 128/64 148/78      Blood Pressure (Exit) 124/72 122/70      Heart Rate (Admit) 72 bpm 79 bpm      Heart Rate (Exercise) 98 bpm 99 bpm      Heart Rate (Exit) 79 bpm 73 bpm      Oxygen Saturation (Admit) 99 % --      Oxygen Saturation (Exercise) 95 % --      Rating of Perceived Exertion (Exercise) 11 13      Symptoms none none      Comments walk test results First two weeks of exercise      Duration -- Continue with 30 min of aerobic exercise without signs/symptoms of physical distress.      Intensity -- THRR unchanged        Progression   Progression -- Continue to progress workloads to maintain intensity without signs/symptoms of physical distress.      Average METs -- 1.78        Resistance Training   Training Prescription -- Yes      Weight -- 3 lb      Reps -- 10-15        Interval Training   Interval  Training -- No        Treadmill   MPH -- 1.2      Grade -- 0      Minutes -- 15      METs -- 1.92  Recumbant Bike   Level -- 2      Minutes -- 15      METs -- 2.1        NuStep   Level -- 5      Minutes -- 15      METs -- 1        REL-XR   Level -- 1      Minutes -- 15      METs -- 2.1        Track   Laps -- 14      Minutes -- 15      METs -- 1.76        Oxygen   Maintain Oxygen Saturation -- 88% or higher               Exercise Comments:   Exercise Comments     Row Name 10/18/22 1037           Exercise Comments First full day of exercise!  Patient was oriented to gym and equipment including functions, settings, policies, and procedures.  Patient's individual exercise prescription and treatment plan were reviewed.  All starting workloads were established based on the results of the 6 minute walk test done at initial orientation visit.  The plan for exercise progression was also introduced and progression will be customized based on patient's performance and goals.                Exercise Goals and Review:   Exercise Goals     Row Name 10/17/22 1137             Exercise Goals   Increase Physical Activity Yes       Intervention Provide advice, education, support and counseling about physical activity/exercise needs.;Develop an individualized exercise prescription for aerobic and resistive training based on initial evaluation findings, risk stratification, comorbidities and participant's personal goals.       Expected Outcomes Short Term: Attend rehab on a regular basis to increase amount of physical activity.;Long Term: Add in home exercise to make exercise part of routine and to increase amount of physical activity.;Long Term: Exercising regularly at least 3-5 days a week.       Increase Strength and Stamina Yes       Intervention Provide advice, education, support and counseling about physical activity/exercise needs.;Develop an  individualized exercise prescription for aerobic and resistive training based on initial evaluation findings, risk stratification, comorbidities and participant's personal goals.       Expected Outcomes Short Term: Increase workloads from initial exercise prescription for resistance, speed, and METs.;Short Term: Perform resistance training exercises routinely during rehab and add in resistance training at home;Long Term: Improve cardiorespiratory fitness, muscular endurance and strength as measured by increased METs and functional capacity ( )       Able to understand and use rate of perceived exertion (RPE) scale Yes       Intervention Provide education and explanation on how to use RPE scale       Expected Outcomes Long Term:  Able to use RPE to guide intensity level when exercising independently;Short Term: Able to use RPE daily in rehab to express subjective intensity level       Able to understand and use Dyspnea scale Yes       Intervention Provide education and explanation on how to use Dyspnea scale       Expected Outcomes Short Term: Able to use Dyspnea scale daily in rehab to express subjective  sense of shortness of breath during exertion;Long Term: Able to use Dyspnea scale to guide intensity level when exercising independently       Knowledge and understanding of Target Heart Rate Range (THRR) Yes       Intervention Provide education and explanation of THRR including how the numbers were predicted and where they are located for reference       Expected Outcomes Short Term: Able to state/look up THRR;Short Term: Able to use daily as guideline for intensity in rehab;Long Term: Able to use THRR to govern intensity when exercising independently       Able to check pulse independently Yes       Intervention Provide education and demonstration on how to check pulse in carotid and radial arteries.;Review the importance of being able to check your own pulse for safety during independent exercise        Expected Outcomes Short Term: Able to explain why pulse checking is important during independent exercise;Long Term: Able to check pulse independently and accurately       Understanding of Exercise Prescription Yes       Intervention Provide education, explanation, and written materials on patient's individual exercise prescription       Expected Outcomes Short Term: Able to explain program exercise prescription;Long Term: Able to explain home exercise prescription to exercise independently                Exercise Goals Re-Evaluation :  Exercise Goals Re-Evaluation     Row Name 10/18/22 1038 11/01/22 0858           Exercise Goal Re-Evaluation   Exercise Goals Review Increase Physical Activity;Able to understand and use rate of perceived exertion (RPE) scale;Knowledge and understanding of Target Heart Rate Range (THRR);Understanding of Exercise Prescription;Increase Strength and Stamina;Able to check pulse independently Increase Physical Activity;Increase Strength and Stamina;Understanding of Exercise Prescription      Comments Reviewed RPE  and dyspnea scale, THR and program prescription with pt today.  Pt voiced understanding and was given a copy of goals to take home. Ian Lucas is off to a good start in the program. He was able to walk up to 1.2 mph with no incline on the treadmill. He also improved to level 5 on the T4 nustep and level 2 on the recumbent bike. We will continue to monitor his progress in the program.      Expected Outcomes Short: Use RPE daily to regulate intensity.  Long: Follow program prescription in THR. Short: Continue to follow initial exercise prescription. Long: Continue to improve strength and stamina.               Discharge Exercise Prescription (Final Exercise Prescription Changes):  Exercise Prescription Changes - 11/01/22 0800       Response to Exercise   Blood Pressure (Admit) 122/68    Blood Pressure (Exercise) 148/78    Blood Pressure (Exit)  122/70    Heart Rate (Admit) 79 bpm    Heart Rate (Exercise) 99 bpm    Heart Rate (Exit) 73 bpm    Rating of Perceived Exertion (Exercise) 13    Symptoms none    Comments First two weeks of exercise    Duration Continue with 30 min of aerobic exercise without signs/symptoms of physical distress.    Intensity THRR unchanged      Progression   Progression Continue to progress workloads to maintain intensity without signs/symptoms of physical distress.    Average METs 1.78  Resistance Training   Training Prescription Yes    Weight 3 lb    Reps 10-15      Interval Training   Interval Training No      Treadmill   MPH 1.2    Grade 0    Minutes 15    METs 1.92      Recumbant Bike   Level 2    Minutes 15    METs 2.1      NuStep   Level 5    Minutes 15    METs 1      REL-XR   Level 1    Minutes 15    METs 2.1      Track   Laps 14    Minutes 15    METs 1.76      Oxygen   Maintain Oxygen Saturation 88% or higher             Nutrition:  Target Goals: Understanding of nutrition guidelines, daily intake of sodium 1500mg , cholesterol 200mg , calories 30% from fat and 7% or less from saturated fats, daily to have 5 or more servings of fruits and vegetables.  Education: All About Nutrition: -Group instruction provided by verbal, written material, interactive activities, discussions, models, and posters to present general guidelines for heart healthy nutrition including fat, fiber, MyPlate, the role of sodium in heart healthy nutrition, utilization of the nutrition label, and utilization of this knowledge for meal planning. Follow up email sent as well. Written material given at graduation. Flowsheet Row Cardiac Rehab from 11/08/2022 in [facility] Cardiac and Pulmonary Rehab  Education need identified 10/17/22       Biometrics:  Pre Biometrics - 10/17/22 1138       Pre Biometrics   Height 5' 7.8" (1.722 m)    Weight 207 lb 11.2 oz (94.2 kg)    Waist Circumference  41 inches    Hip Circumference 41 inches    Waist to Hip Ratio 1 %    BMI (Calculated) 31.77    Single Leg Stand 2.9 seconds              Nutrition Therapy Plan and Nutrition Goals:  Nutrition Therapy & Goals - 10/17/22 1139       Intervention Plan   Intervention Prescribe, educate and counsel regarding individualized specific dietary modifications aiming towards targeted core components such as weight, hypertension, lipid management, diabetes, heart failure and other comorbidities.    Expected Outcomes Short Term Goal: Understand basic principles of dietary content, such as calories, fat, sodium, cholesterol and nutrients.;Short Term Goal: A plan has been developed with personal nutrition goals set during dietitian appointment.;Long Term Goal: Adherence to prescribed nutrition plan.             Nutrition Assessments:  MEDIFICTS Score Key: ?70 Need to make dietary changes  40-70 Heart Healthy Diet ? 40 Therapeutic Level Cholesterol Diet  Flowsheet Row Cardiac Rehab from 10/17/2022 in [facility] Cardiac and Pulmonary Rehab  Picture Your Plate Total Score on Admission 39      Picture Your Plate Scores: <91 Unhealthy dietary pattern with much room for improvement. 41-50 Dietary pattern unlikely to meet recommendations for good health and room for improvement. 51-60 More healthful dietary pattern, with some room for improvement.  >60 Healthy dietary pattern, although there may be some specific behaviors that could be improved.    Nutrition Goals Re-Evaluation:  Nutrition Goals Re-Evaluation     Row Name 11/06/22 1146  Goals   Comment Patient scheduled an appointment today to meet with program RD. His appointment is 11/20/2022.       Expected Outcome Short: meet with RD to set specific nutrition goals. Long: adapt to a heart healhty diet to help control cardiac risk factors.                Nutrition Goals Discharge (Final Nutrition Goals  Re-Evaluation):  Nutrition Goals Re-Evaluation - 11/06/22 1146       Goals   Comment Patient scheduled an appointment today to meet with program RD. His appointment is 11/20/2022.    Expected Outcome Short: meet with RD to set specific nutrition goals. Long: adapt to a heart healhty diet to help control cardiac risk factors.             Psychosocial: Target Goals: Acknowledge presence or absence of significant depression and/or stress, maximize coping skills, provide positive support system. Participant is able to verbalize types and ability to use techniques and skills needed for reducing stress and depression.   Education: Stress, Anxiety, and Depression - Group verbal and visual presentation to define topics covered.  Reviews how body is impacted by stress, anxiety, and depression.  Also discusses healthy ways to reduce stress and to treat/manage anxiety and depression.  Written material given at graduation.   Education: Sleep Hygiene -Provides group verbal and written instruction about how sleep can affect your health.  Define sleep hygiene, discuss sleep cycles and impact of sleep habits. Review good sleep hygiene tips.    Initial Review & Psychosocial Screening:  Initial Psych Review & Screening - 10/11/22 1344       Initial Review   Current issues with Current Anxiety/Panic;Current Psychotropic Meds;Current Depression;History of Depression      Family Dynamics   Good Support System? Yes    Comments He can look to his home nurse for support. His wife is a good supporty system and his son comes by three times a week.      Barriers   Psychosocial barriers to participate in program The patient should benefit from training in stress management and relaxation.;There are no identifiable barriers or psychosocial needs.      Screening Interventions   Interventions To provide support and resources with identified psychosocial needs;Provide feedback about the scores to participant     Expected Outcomes Short Term goal: Utilizing psychosocial counselor, staff and physician to assist with identification of specific Stressors or current issues interfering with healing process. Setting desired goal for each stressor or current issue identified.;Long Term Goal: Stressors or current issues are controlled or eliminated.;Short Term goal: Identification and review with participant of any Quality of Life or Depression concerns found by scoring the questionnaire.;Long Term goal: The participant improves quality of Life and PHQ9 Scores as seen by post scores and/or verbalization of changes             Quality of Life Scores:   Quality of Life - 10/17/22 1139       Quality of Life   Select Quality of Life      Quality of Life Scores   Health/Function Pre 19.67 %    Socioeconomic Pre 26.4 %    Psych/Spiritual Pre 25.71 %    Family Pre 28.8 %    GLOBAL Pre 23.47 %            Scores of 19 and below usually indicate a poorer quality of life in these areas.  A difference of  2-3 points is a clinically meaningful difference.  A difference of 2-3 points in the total score of the Quality of Life Index has been associated with significant improvement in overall quality of life, self-image, physical symptoms, and general health in studies assessing change in quality of life.  PHQ-9: Review Flowsheet  More data exists      11/06/2022 10/17/2022 11/25/2018 05/29/2016 03/14/2016  Depression screen PHQ 2/9  Decreased Interest 1 0 0 0 0  Down, Depressed, Hopeless 2 1 0 0 0  PHQ - 2 Score 3 1 0 0 0  Altered sleeping 2 0 0 - -  Tired, decreased energy 2 1 0 - -  Change in appetite 2 0 0 - -  Feeling bad or failure about yourself  0 1 0 - -  Trouble concentrating 0 1 0 - -  Moving slowly or fidgety/restless 0 2 0 - -  Suicidal thoughts 0 0 0 - -  PHQ-9 Score 9 6 0 - -  Difficult doing work/chores Not difficult at all Somewhat difficult Not difficult at all - -   Interpretation of  Total Score  Total Score Depression Severity:  1-4 = Minimal depression, 5-9 = Mild depression, 10-14 = Moderate depression, 15-19 = Moderately severe depression, 20-27 = Severe depression   Psychosocial Evaluation and Intervention:  Psychosocial Evaluation - 10/11/22 1346       Psychosocial Evaluation & Interventions   Interventions Encouraged to exercise with the program and follow exercise prescription;Relaxation education;Stress management education    Comments He can look to his home nurse for support. His wife is a good supporty system and his son comes by three times a week.    Expected Outcomes Short: Start [facility] to help with mood. Long: Maintain a healthy mental state    Continue Psychosocial Services  Follow up required by staff             Psychosocial Re-Evaluation:  Psychosocial Re-Evaluation     Row Name 11/06/22 1149             Psychosocial Re-Evaluation   Current issues with Current Psychotropic Meds;Current Depression       Comments Patient reports that he has been on mood medications for a long time and that he follow up with his doctor if new concerns arise. He currently takes all his meds a presrcibed and feels like he is maintaining his mental health.       Expected Outcomes Short: continue to take mood medications and follow up with doctor if any changes or concerns arise. Long: maintain good mental health habits to help control a stable mood.       Interventions Encouraged to attend Cardiac Rehabilitation for the exercise       Continue Psychosocial Services  Follow up required by staff                Psychosocial Discharge (Final Psychosocial Re-Evaluation):  Psychosocial Re-Evaluation - 11/06/22 1149       Psychosocial Re-Evaluation   Current issues with Current Psychotropic Meds;Current Depression    Comments Patient reports that he has been on mood medications for a long time and that he follow up with his doctor if new concerns arise.  He currently takes all his meds a presrcibed and feels like he is maintaining his mental health.    Expected Outcomes Short: continue to take mood medications and follow up with doctor if any changes or concerns arise. Long: maintain good mental health habits  to help control a stable mood.    Interventions Encouraged to attend Cardiac Rehabilitation for the exercise    Continue Psychosocial Services  Follow up required by staff             Vocational Rehabilitation: Provide vocational rehab assistance to qualifying candidates.   Vocational Rehab Evaluation & Intervention:   Education: Education Goals: Education classes will be provided on a variety of topics geared toward better understanding of heart health and risk factor modification. Participant will state understanding/return demonstration of topics presented as noted by education test scores.  Learning Barriers/Preferences:  Learning Barriers/Preferences - 10/11/22 1343       Learning Barriers/Preferences   Learning Barriers None    Learning Preferences None             General Cardiac Education Topics:  AED/CPR: - Group verbal and written instruction with the use of models to demonstrate the basic use of the AED with the basic ABC's of resuscitation.   Anatomy and Cardiac Procedures: - Group verbal and visual presentation and models provide information about basic cardiac anatomy and function. Reviews the testing methods done to diagnose heart disease and the outcomes of the test results. Describes the treatment choices: Medical Management, Angioplasty, or Coronary Bypass Surgery for treating various heart conditions including Myocardial Infarction, Angina, Valve Disease, and Cardiac Arrhythmias.  Written material given at graduation. Flowsheet Row Cardiac Rehab from 11/08/2022 in [facility] Cardiac and Pulmonary Rehab  Education need identified 10/17/22  Date 11/08/22  Educator SB  Instruction Review Code 1- Verbalizes  Understanding       Medication Safety: - Group verbal and visual instruction to review commonly prescribed medications for heart and lung disease. Reviews the medication, class of the drug, and side effects. Includes the steps to properly store meds and maintain the prescription regimen.  Written material given at graduation.   Intimacy: - Group verbal instruction through game format to discuss how heart and lung disease can affect sexual intimacy. Written material given at graduation..   Know Your Numbers and Heart Failure: - Group verbal and visual instruction to discuss disease risk factors for cardiac and pulmonary disease and treatment options.  Reviews associated critical values for Overweight/Obesity, Hypertension, Cholesterol, and Diabetes.  Discusses basics of heart failure: signs/symptoms and treatments.  Introduces Heart Failure Zone chart for action plan for heart failure.  Written material given at graduation. Flowsheet Row Cardiac Rehab from 11/08/2022 in [facility] Cardiac and Pulmonary Rehab  Education need identified 10/17/22       Infection Prevention: - Provides verbal and written material to individual with discussion of infection control including proper hand washing and proper equipment cleaning during exercise session. Flowsheet Row Cardiac Rehab from 11/08/2022 in [facility] Cardiac and Pulmonary Rehab  Date 10/17/22  Educator [facility]  Instruction Review Code 1- Verbalizes Understanding       Falls Prevention: - Provides verbal and written material to individual with discussion of falls prevention and safety. Flowsheet Row Cardiac Rehab from 11/08/2022 in [facility] Cardiac and Pulmonary Rehab  Date 10/17/22  Educator [facility]  Instruction Review Code 1- Verbalizes Understanding       Other: -Provides group and verbal instruction on various topics (see comments) Flowsheet Row Cardiac Rehab from 11/08/2022 in [facility] Cardiac and Pulmonary Rehab  Date 10/18/22  Educator Relaxation- [facility]   Instruction Review Code 1- Verbalizes Understanding       Knowledge Questionnaire Score:  Knowledge Questionnaire Score - 10/17/22 1140  Knowledge Questionnaire Score   Pre Score 20/26             Core Components/Risk Factors/Patient Goals at Admission:  Personal Goals and Risk Factors at Admission - 10/17/22 1140       Core Components/Risk Factors/Patient Goals on Admission    Weight Management Yes;Weight Loss;Obesity    Intervention Weight Management: Develop a combined nutrition and exercise program designed to reach desired caloric intake, while maintaining appropriate intake of nutrient and fiber, sodium and fats, and appropriate energy expenditure required for the weight goal.;Weight Management: Provide education and appropriate resources to help participant work on and attain dietary goals.;Weight Management/Obesity: Establish reasonable short term and long term weight goals.;Obesity: Provide education and appropriate resources to help participant work on and attain dietary goals.    Admit Weight 207 lb 11.2 oz (94.2 kg)    Goal Weight: Short Term 202 lb (91.6 kg)    Goal Weight: Long Term 197 lb (89.4 kg)    Expected Outcomes Short Term: Continue to assess and modify interventions until short term weight is achieved;Long Term: Adherence to nutrition and physical activity/exercise program aimed toward attainment of established weight goal;Weight Loss: Understanding of general recommendations for a balanced deficit meal plan, which promotes 1-2 lb weight loss per week and includes a negative energy balance of [phone] kcal/d;Understanding recommendations for meals to include 15-35% energy as protein, 25-35% energy from fat, 35-60% energy from carbohydrates, less than 200mg  of dietary cholesterol, 20-35 gm of total fiber daily;Understanding of distribution of calorie intake throughout the day with the consumption of 4-5 meals/snacks    Diabetes Yes    Intervention Provide  education about signs/symptoms and action to take for hypo/hyperglycemia.;Provide education about proper nutrition, including hydration, and aerobic/resistive exercise prescription along with prescribed medications to achieve blood glucose in normal ranges: Fasting glucose 65-99 mg/dL    Expected Outcomes Short Term: Participant verbalizes understanding of the signs/symptoms and immediate care of hyper/hypoglycemia, proper foot care and importance of medication, aerobic/resistive exercise and nutrition plan for blood glucose control.;Long Term: Attainment of HbA1C < 7%.    Heart Failure Yes    Intervention Provide a combined exercise and nutrition program that is supplemented with education, support and counseling about heart failure. Directed toward relieving symptoms such as shortness of breath, decreased exercise tolerance, and extremity edema.    Expected Outcomes Improve functional capacity of life;Short term: Attendance in program 2-3 days a week with increased exercise capacity. Reported lower sodium intake. Reported increased fruit and vegetable intake. Reports medication compliance.;Short term: Daily weights obtained and reported for increase. Utilizing diuretic protocols set by physician.;Long term: Adoption of self-care skills and reduction of barriers for early signs and symptoms recognition and intervention leading to self-care maintenance.    Hypertension Yes    Intervention Provide education on lifestyle modifcations including regular physical activity/exercise, weight management, moderate sodium restriction and increased consumption of fresh fruit, vegetables, and low fat dairy, alcohol moderation, and smoking cessation.;Monitor prescription use compliance.    Expected Outcomes Short Term: Continued assessment and intervention until BP is < 140/17mm HG in hypertensive participants. < 130/60mm HG in hypertensive participants with diabetes, heart failure or chronic kidney disease.;Long Term:  Maintenance of blood pressure at goal levels.    Lipids Yes    Intervention Provide education and support for participant on nutrition & aerobic/resistive exercise along with prescribed medications to achieve LDL 70mg , HDL >40mg .    Expected Outcomes Short Term: Participant states understanding of desired cholesterol values and  is compliant with medications prescribed. Participant is following exercise prescription and nutrition guidelines.;Long Term: Cholesterol controlled with medications as prescribed, with individualized exercise RX and with personalized nutrition plan. Value goals: LDL < 70mg , HDL > 40 mg.             Education:Diabetes - Individual verbal and written instruction to review signs/symptoms of diabetes, desired ranges of glucose level fasting, after meals and with exercise. Acknowledge that pre and post exercise glucose checks will be done for 3 sessions at entry of program. Flowsheet Row Cardiac Rehab from 11/08/2022 in [facility] Cardiac and Pulmonary Rehab  Date 10/17/22  Educator [facility]  Instruction Review Code 1- Verbalizes Understanding       Core Components/Risk Factors/Patient Goals Review:   Goals and Risk Factor Review     Row Name 11/06/22 1131             Core Components/Risk Factors/Patient Goals Review   Personal Goals Review Weight Management/Obesity;Diabetes;Hypertension       Review Patient reports that he has been losing weight. He is on Ozempic for DM. He takes all his meds as prescribed and has a home health provider come to his house once a month to help him manage and set up his meds for the month. The provider also takes his BP and he monitors it independently inbetween visits.       Expected Outcomes Short: continue to monitor BP and work with home health care provider on managing medications. Long: continue to control cardiac risk factors.                Core Components/Risk Factors/Patient Goals at Discharge (Final Review):   Goals and Risk  Factor Review - 11/06/22 1131       Core Components/Risk Factors/Patient Goals Review   Personal Goals Review Weight Management/Obesity;Diabetes;Hypertension    Review Patient reports that he has been losing weight. He is on Ozempic for DM. He takes all his meds as prescribed and has a home health provider come to his house once a month to help him manage and set up his meds for the month. The provider also takes his BP and he monitors it independently inbetween visits.    Expected Outcomes Short: continue to monitor BP and work with home health care provider on managing medications. Long: continue to control cardiac risk factors.             ITP Comments:  ITP Comments     Row Name 10/11/22 1346 10/17/22 1120 10/18/22 1037 10/18/22 1154 11/14/22 1403   ITP Comments Virtual Visit completed. Patient informed on EP and RD appointment and 6 Minute walk test. Patient also informed of patient health questionnaires on My Chart. Patient Verbalizes understanding. Visit diagnosis can be found in [facility] 07/22/2022. Completed and gym orientation. Initial ITP created and sent for review to Dr. Bethann Punches, Medical Director. First full day of exercise!  Patient was oriented to gym and equipment including functions, settings, policies, and procedures.  Patient's individual exercise prescription and treatment plan were reviewed.  All starting workloads were established based on the results of the 6 minute walk test done at initial orientation visit.  The plan for exercise progression was also introduced and progression will be customized based on patient's performance and goals. 30 Day review completed. Medical Director ITP review done, changes made as directed, and signed approval by Medical Director.   new to program 30 Day review completed. Medical Director ITP review done, changes made  as directed, and signed approval by Wellsite geologist.            Comments:

## 2022-11-15 ENCOUNTER — Encounter: Payer: No Typology Code available for payment source | Admitting: *Deleted

## 2022-11-20 ENCOUNTER — Encounter: Payer: No Typology Code available for payment source | Admitting: *Deleted

## 2022-11-20 DIAGNOSIS — I5022 Chronic systolic (congestive) heart failure: Secondary | ICD-10-CM

## 2022-11-20 NOTE — Progress Notes (Signed)
Daily Session Note  Patient Details  Name: Ian Lucas MRN: [id number] Date of Birth: 30-Dec-1950 Referring Provider:   Flowsheet Row Cardiac Rehab from 10/17/2022 in [facility] Cardiac and Pulmonary Rehab  Referring Provider Nelly Laurence MD       Encounter Date: 11/20/2022  Check In:  Session Check In - 11/20/22 1056       Check-In   Supervising physician immediately available to respond to emergencies See telemetry face sheet for immediately available ER MD    Location [facility]-Cardiac & Pulmonary Rehab    Staff Present Lanny Hurst, RN, ADN;Joseph Hood, RCP,RRT,BSRT;Kelly Madilyn Fireman, BS, ACSM CEP, Exercise Physiologist    Virtual Visit No    Medication changes reported     No    Fall or balance concerns reported    No    Warm-up and Cool-down Performed on first and last piece of equipment    Resistance Training Performed Yes    VAD Patient? No    PAD/SET Patient? No      Pain Assessment   Currently in Pain? No/denies                Social History   Tobacco Use  Smoking Status Former   Current packs/day: 0.00   Average packs/day: 0.3 packs/day for 30.1 years (7.5 ttl pk-yrs)   Types: Cigarettes   Start date: 05/08/1968   Quit date: 06/17/1998   Years since quitting: 24.4  Smokeless Tobacco Never    Goals Met:  Independence with exercise equipment Exercise tolerated well No report of concerns or symptoms today Strength training completed today  Goals Unmet:  Not Applicable  Comments: Pt able to follow exercise prescription today without complaint.  Will continue to monitor for progression.    Dr. Bethann Punches is Medical Director for [facility] Cardiac Rehabilitation.  Dr. Vida Rigger is Medical Director for [facility] Pulmonary Rehabilitation.

## 2022-11-22 ENCOUNTER — Encounter: Payer: No Typology Code available for payment source | Admitting: *Deleted

## 2022-11-22 DIAGNOSIS — I5022 Chronic systolic (congestive) heart failure: Secondary | ICD-10-CM | POA: Diagnosis not present

## 2022-11-22 NOTE — Progress Notes (Signed)
Daily Session Note  Patient Details  Name: Ian Lucas MRN: [id number] Date of Birth: 01-Dec-1950 Referring Provider:   Flowsheet Row Cardiac Rehab from 10/17/2022 in [facility] Cardiac and Pulmonary Rehab  Referring Provider Nelly Laurence MD       Encounter Date: 11/22/2022  Check In:  Session Check In - 11/22/22 1128       Check-In   Supervising physician immediately available to respond to emergencies See telemetry face sheet for immediately available ER MD    Location [facility]-Cardiac & Pulmonary Rehab    Staff Present Lanny Hurst, RN, ADN;Joseph Reino Kent, RCP,RRT,BSRT;Meredith Jewel Baize, RN BSN;Denise Rhew, PhD, RN, CNS, CEN    Virtual Visit No    Medication changes reported     No    Fall or balance concerns reported    No    Warm-up and Cool-down Performed on first and last piece of equipment    Resistance Training Performed Yes    VAD Patient? No    PAD/SET Patient? No      Pain Assessment   Currently in Pain? No/denies                Social History   Tobacco Use  Smoking Status Former   Current packs/day: 0.00   Average packs/day: 0.3 packs/day for 30.1 years (7.5 ttl pk-yrs)   Types: Cigarettes   Start date: 05/08/1968   Quit date: 06/17/1998   Years since quitting: 24.4  Smokeless Tobacco Never    Goals Met:  Independence with exercise equipment Exercise tolerated well No report of concerns or symptoms today Strength training completed today  Goals Unmet:  Not Applicable  Comments: Pt able to follow exercise prescription today without complaint.  Will continue to monitor for progression.    Dr. Bethann Punches is Medical Director for [facility] Cardiac Rehabilitation.  Dr. Vida Rigger is Medical Director for [facility] Pulmonary Rehabilitation.

## 2022-11-27 ENCOUNTER — Encounter: Payer: No Typology Code available for payment source | Admitting: *Deleted

## 2022-11-27 DIAGNOSIS — I5022 Chronic systolic (congestive) heart failure: Secondary | ICD-10-CM

## 2022-11-27 NOTE — Progress Notes (Signed)
Daily Session Note  Patient Details  Name: Ian Lucas MRN: [id number] Date of Birth: 10-Oct-1950 Referring Provider:   Flowsheet Row Cardiac Rehab from 10/17/2022 in [facility] Cardiac and Pulmonary Rehab  Referring Provider Nelly Laurence MD       Encounter Date: 11/27/2022  Check In:  Session Check In - 11/27/22 1114       Check-In   Supervising physician immediately available to respond to emergencies See telemetry face sheet for immediately available ER MD    Location [facility]-Cardiac & Pulmonary Rehab    Staff Present Lanny Hurst, RN, ADN;Joseph Reino Kent, RCP,RRT,BSRT;Meredith Jewel Baize, RN BSN    Virtual Visit No    Medication changes reported     No    Fall or balance concerns reported    No    Warm-up and Cool-down Performed on first and last piece of equipment    Resistance Training Performed Yes    VAD Patient? No    PAD/SET Patient? No      Pain Assessment   Currently in Pain? No/denies                Social History   Tobacco Use  Smoking Status Former   Current packs/day: 0.00   Average packs/day: 0.3 packs/day for 30.1 years (7.5 ttl pk-yrs)   Types: Cigarettes   Start date: 05/08/1968   Quit date: 06/17/1998   Years since quitting: 24.4  Smokeless Tobacco Never    Goals Met:  Independence with exercise equipment Exercise tolerated well No report of concerns or symptoms today Strength training completed today  Goals Unmet:  Not Applicable  Comments: Pt able to follow exercise prescription today without complaint.  Will continue to monitor for progression.    Dr. Bethann Punches is Medical Director for [facility] Cardiac Rehabilitation.  Dr. Vida Rigger is Medical Director for [facility] Pulmonary Rehabilitation.

## 2022-11-29 ENCOUNTER — Encounter: Payer: No Typology Code available for payment source | Admitting: *Deleted

## 2022-11-29 DIAGNOSIS — I5022 Chronic systolic (congestive) heart failure: Secondary | ICD-10-CM

## 2022-11-29 NOTE — Progress Notes (Signed)
Daily Session Note  Patient Details  Name: Ian Lucas MRN: [id number] Date of Birth: 1951-03-29 Referring Provider:   Flowsheet Row Cardiac Rehab from 10/17/2022 in [facility] Cardiac and Pulmonary Rehab  Referring Provider Nelly Laurence MD       Encounter Date: 11/29/2022  Check In:  Session Check In - 11/29/22 1108       Check-In   Supervising physician immediately available to respond to emergencies See telemetry face sheet for immediately available ER MD    Location [facility]-Cardiac & Pulmonary Rehab    Staff Present Lanny Hurst, RN, [place];Charolett Bumpers, RN BSN   Rory Percy, MS / Jetta Lout, BS   Virtual Visit No    Medication changes reported     No    Fall or balance concerns reported    No    Warm-up and Cool-down Performed on first and last piece of equipment    Resistance Training Performed Yes    VAD Patient? No    PAD/SET Patient? No      Pain Assessment   Currently in Pain? No/denies                Social History   Tobacco Use  Smoking Status Former   Current packs/day: 0.00   Average packs/day: 0.3 packs/day for 30.1 years (7.5 ttl pk-yrs)   Types: Cigarettes   Start date: 05/08/1968   Quit date: 06/17/1998   Years since quitting: 24.4  Smokeless Tobacco Never    Goals Met:  Independence with exercise equipment Exercise tolerated well No report of concerns or symptoms today Strength training completed today  Goals Unmet:  Not Applicable  Comments: Pt able to follow exercise prescription today without complaint.  Will continue to monitor for progression.    Dr. Bethann Punches is Medical Director for [facility] Cardiac Rehabilitation.  Dr. Vida Rigger is Medical Director for [facility] Pulmonary Rehabilitation.

## 2022-12-04 ENCOUNTER — Encounter: Payer: No Typology Code available for payment source | Admitting: *Deleted

## 2022-12-04 DIAGNOSIS — I5022 Chronic systolic (congestive) heart failure: Secondary | ICD-10-CM | POA: Diagnosis not present

## 2022-12-04 NOTE — Progress Notes (Signed)
Daily Session Note  Patient Details  Name: JAETYN WIGGANS MRN: [id number] Date of Birth: [birth year] Referring Provider:   Flowsheet Row Cardiac Rehab from 10/17/2022 in [facility] Cardiac and Pulmonary Rehab  Referring Provider Nelly Laurence MD       Encounter Date: 12/04/2022  Check In:  Session Check In - 12/04/22 1124       Check-In   Supervising physician immediately available to respond to emergencies See telemetry face sheet for immediately available ER MD    Staff Present Lanny Hurst, RN, ADN;Joseph Reino Kent, RCP,RRT,BSRT;Kelly Madilyn Fireman, BS, ACSM CEP, Exercise Physiologist    Virtual Visit No    Medication changes reported     No    Fall or balance concerns reported    No    Warm-up and Cool-down Performed on first and last piece of equipment    Resistance Training Performed Yes    VAD Patient? No    PAD/SET Patient? No      Pain Assessment   Currently in Pain? No/denies                Social History   Tobacco Use  Smoking Status Former   Current packs/day: 0.00   Average packs/day: 0.3 packs/day for 30.1 years (7.5 ttl pk-yrs)   Types: Cigarettes   Start date: 05/08/1968   Quit date: 06/17/1998   Years since quitting: 24.4  Smokeless Tobacco Never    Goals Met:  Independence with exercise equipment Exercise tolerated well No report of concerns or symptoms today Strength training completed today  Goals Unmet:  Not Applicable  Comments: Pt able to follow exercise prescription today without complaint.  Will continue to monitor for progression.    Dr. Bethann Punches is Medical Director for [facility] Cardiac Rehabilitation.  Dr. Vida Rigger is Medical Director for [facility] Pulmonary Rehabilitation.

## 2022-12-06 ENCOUNTER — Encounter: Payer: No Typology Code available for payment source | Admitting: *Deleted

## 2022-12-06 DIAGNOSIS — I5022 Chronic systolic (congestive) heart failure: Secondary | ICD-10-CM | POA: Diagnosis not present

## 2022-12-06 NOTE — Progress Notes (Signed)
Daily Session Note  Patient Details  Name: Ian Lucas MRN: [id number] Date of Birth: [birth year] Referring Provider:   Flowsheet Row Cardiac Rehab from 10/17/2022 in [facility] Cardiac and Pulmonary Rehab  Referring Provider Nelly Laurence MD       Encounter Date: 12/06/2022  Check In:  Session Check In - 12/06/22 1112       Check-In   Supervising physician immediately available to respond to emergencies See telemetry face sheet for immediately available ER MD    Location [facility]-Cardiac & Pulmonary Rehab    Staff Present Lanny Hurst, RN, ADN;Joseph [place], RCP,RRT,BSRT;Other   Girtha Rm, MS   Virtual Visit No    Medication changes reported     No    Fall or balance concerns reported    No    Warm-up and Cool-down Performed on first and last piece of equipment    Resistance Training Performed Yes    VAD Patient? No    PAD/SET Patient? No      Pain Assessment   Currently in Pain? No/denies                Social History   Tobacco Use  Smoking Status Former   Current packs/day: 0.00   Average packs/day: 0.3 packs/day for 30.1 years (7.5 ttl pk-yrs)   Types: Cigarettes   Start date: 05/08/1968   Quit date: 06/17/1998   Years since quitting: 24.4  Smokeless Tobacco Never    Goals Met:  Independence with exercise equipment Exercise tolerated well No report of concerns or symptoms today Strength training completed today  Goals Unmet:  Not Applicable  Comments: Pt able to follow exercise prescription today without complaint.  Will continue to monitor for progression.    Dr. Bethann Punches is Medical Director for [facility] Cardiac Rehabilitation.  Dr. Vida Rigger is Medical Director for [facility] Pulmonary Rehabilitation.

## 2022-12-08 ENCOUNTER — Encounter: Payer: No Typology Code available for payment source | Admitting: *Deleted

## 2022-12-08 DIAGNOSIS — I5022 Chronic systolic (congestive) heart failure: Secondary | ICD-10-CM | POA: Insufficient documentation

## 2022-12-08 DIAGNOSIS — Z4889 Encounter for other specified surgical aftercare: Secondary | ICD-10-CM | POA: Diagnosis not present

## 2022-12-08 NOTE — Progress Notes (Signed)
Daily Session Note  Patient Details  Name: Ian Lucas MRN: [id number] Date of Birth: [birth year] Referring Provider:   Flowsheet Row Cardiac Rehab from 10/17/2022 in [facility] Cardiac and Pulmonary Rehab  Referring Provider Nelly Laurence MD       Encounter Date: 12/08/2022  Check In:  Session Check In - 12/08/22 1147       Check-In   Supervising physician immediately available to respond to emergencies See telemetry face sheet for immediately available ER MD    Location [facility]-Cardiac & Pulmonary Rehab    Staff Present Cora Collum, RN, BSN, CCRP;Joseph Hood, RCP,RRT,BSRT;Noah Tickle, [place], Exercise Physiologist    Virtual Visit No    Medication changes reported     No    Fall or balance concerns reported    No    Warm-up and Cool-down Performed on first and last piece of equipment    Resistance Training Performed Yes    VAD Patient? No    PAD/SET Patient? No      Pain Assessment   Currently in Pain? No/denies                Social History   Tobacco Use  Smoking Status Former   Current packs/day: 0.00   Average packs/day: 0.3 packs/day for 30.1 years (7.5 ttl pk-yrs)   Types: Cigarettes   Start date: 05/08/1968   Quit date: 06/17/1998   Years since quitting: 24.4  Smokeless Tobacco Never    Goals Met:  Independence with exercise equipment Exercise tolerated well No report of concerns or symptoms today  Goals Unmet:  Not Applicable  Comments: Pt able to follow exercise prescription today without complaint.  Will continue to monitor for progression.    Dr. Bethann Punches is Medical Director for [facility] Cardiac Rehabilitation.  Dr. Vida Rigger is Medical Director for [facility] Pulmonary Rehabilitation.

## 2022-12-11 ENCOUNTER — Encounter: Payer: No Typology Code available for payment source | Admitting: *Deleted

## 2022-12-11 DIAGNOSIS — I5022 Chronic systolic (congestive) heart failure: Secondary | ICD-10-CM

## 2022-12-11 NOTE — Progress Notes (Signed)
Daily Session Note  Patient Details  Name: Ian Lucas MRN: [id number] Date of Birth: 06/25/1950 Referring Provider:   Flowsheet Row Cardiac Rehab from 10/17/2022 in [facility] Cardiac and Pulmonary Rehab  Referring Provider Nelly Laurence MD       Encounter Date: 12/11/2022  Check In:  Session Check In - 12/11/22 1118       Check-In   Supervising physician immediately available to respond to emergencies See telemetry face sheet for immediately available ER MD    Location [facility]-Cardiac & Pulmonary Rehab    Staff Present Lanny Hurst, RN, Franki Monte, BS, ACSM CEP, Exercise Physiologist;Meredith Jewel Baize, RN BSN;Other   Girtha Rm, MS   Virtual Visit No    Medication changes reported     No    Fall or balance concerns reported    No    Warm-up and Cool-down Performed on first and last piece of equipment    Resistance Training Performed Yes    VAD Patient? No    PAD/SET Patient? No      Pain Assessment   Currently in Pain? No/denies                Social History   Tobacco Use  Smoking Status Former   Current packs/day: 0.00   Average packs/day: 0.3 packs/day for 30.1 years (7.5 ttl pk-yrs)   Types: Cigarettes   Start date: 05/08/1968   Quit date: 06/17/1998   Years since quitting: 24.5  Smokeless Tobacco Never    Goals Met:  Independence with exercise equipment Exercise tolerated well No report of concerns or symptoms today Strength training completed today  Goals Unmet:  Not Applicable  Comments: Pt able to follow exercise prescription today without complaint.  Will continue to monitor for progression.    Dr. Bethann Punches is Medical Director for [facility] Cardiac Rehabilitation.  Dr. Vida Rigger is Medical Director for [facility] Pulmonary Rehabilitation.

## 2022-12-13 ENCOUNTER — Encounter: Payer: No Typology Code available for payment source | Admitting: *Deleted

## 2022-12-13 ENCOUNTER — Encounter: Payer: Self-pay | Admitting: *Deleted

## 2022-12-13 DIAGNOSIS — I5022 Chronic systolic (congestive) heart failure: Secondary | ICD-10-CM

## 2022-12-13 NOTE — Progress Notes (Signed)
Daily Session Note  Patient Details  Name: Ian Lucas MRN: [id number] Date of Birth: Dec 31, 1950 Referring Provider:   Flowsheet Row Cardiac Rehab from 10/17/2022 in [facility] Cardiac and Pulmonary Rehab  Referring Provider Nelly Laurence MD       Encounter Date: 12/13/2022  Check In:  Session Check In - 12/13/22 1101       Check-In   Supervising physician immediately available to respond to emergencies See telemetry face sheet for immediately available ER MD    Location [facility]-Cardiac & Pulmonary Rehab    Staff Present Lanny Hurst, RN, ADN;Joseph Hood, RCP,RRT,BSRT;Other   Max [place], BS / Girtha Rm, MS   Virtual Visit No    Medication changes reported     No    Fall or balance concerns reported    No    Warm-up and Cool-down Performed on first and last piece of equipment    Resistance Training Performed Yes    VAD Patient? No    PAD/SET Patient? No      Pain Assessment   Currently in Pain? No/denies                Social History   Tobacco Use  Smoking Status Former   Current packs/day: 0.00   Average packs/day: 0.3 packs/day for 30.1 years (7.5 ttl pk-yrs)   Types: Cigarettes   Start date: 05/08/1968   Quit date: 06/17/1998   Years since quitting: 24.5  Smokeless Tobacco Never    Goals Met:  Independence with exercise equipment Exercise tolerated well No report of concerns or symptoms today Strength training completed today  Goals Unmet:  Not Applicable  Comments: Pt able to follow exercise prescription today without complaint.  Will continue to monitor for progression.    Dr. Bethann Punches is Medical Director for [facility] Cardiac Rehabilitation.  Dr. Vida Rigger is Medical Director for [facility] Pulmonary Rehabilitation.

## 2022-12-13 NOTE — Progress Notes (Signed)
Cardiac Individual Treatment Plan  Patient Details  Name: Ian Lucas MRN: [id number] Date of Birth: Jun 21, 1950 Referring Provider:   Flowsheet Row Cardiac Rehab from 10/17/2022 in [facility] Cardiac and Pulmonary Rehab  Referring Provider Nelly Laurence MD       Initial Encounter Date:  Flowsheet Row Cardiac Rehab from 10/17/2022 in [facility] Cardiac and Pulmonary Rehab  Date 10/17/22       Visit Diagnosis: Heart failure, chronic systolic (HCC)  Patient's Home Medications on Admission:  Current Outpatient Medications:    albuterol (VENTOLIN HFA) 108 (90 Base) MCG/ACT inhaler, Inhale 2 puffs into the lungs every 6 (six) hours as needed for wheezing or shortness of breath., Disp: 6.7 g, Rfl: 0   Alogliptin Benzoate 25 MG TABS, Take 25 mg by mouth daily., Disp: , Rfl:    amLODipine (NORVASC) 10 MG tablet, Take 10 mg by mouth daily. , Disp: , Rfl:    carbamide peroxide (DEBROX) 6.5 % OTIC solution, Place in ear(s)., Disp: , Rfl:    carvedilol (COREG) 12.5 MG tablet, Take 1 tablet (12.5 mg total) by mouth 2 (two) times daily with a meal. (Patient not taking: Reported on 10/11/2022), Disp: 60 tablet, Rfl: 1   carvedilol (COREG) 25 MG tablet, Take by mouth., Disp: , Rfl:    Cholecalciferol 50 MCG (2000 UT) TABS, Take by mouth., Disp: , Rfl:    donepezil (ARICEPT) 10 MG tablet, Take 5 mg by mouth in the morning. (Patient not taking: Reported on 10/11/2022), Disp: , Rfl:    DULoxetine (CYMBALTA) 60 MG capsule, Take 60 mg by mouth daily. , Disp: , Rfl:    EPINEPHrine 0.3 mg/0.3 mL IJ SOAJ injection, Inject 0.3 mg into the muscle as needed for anaphylaxis., Disp: 1 each, Rfl: 1   ergocalciferol (VITAMIN D2) 1.25 MG (50000 UT) capsule, Take 50,000 Units by mouth once a week., Disp: , Rfl:    famotidine (PEPCID) 20 MG tablet, Take 20 mg by mouth daily., Disp: , Rfl:    fluticasone-salmeterol (ADVAIR HFA) 115-21 MCG/ACT inhaler, Inhale 2 puffs into the lungs 2 (two) times daily., Disp: 1 each, Rfl: 12    hydrOXYzine (ATARAX) 10 MG tablet, Take 1 tablet (10 mg total) by mouth 3 (three) times daily as needed for anxiety., Disp: 30 tablet, Rfl: 0   ibuprofen (ADVIL,MOTRIN) 600 MG tablet, Take 1 tablet (600 mg total) by mouth every 6 (six) hours as needed. (Patient not taking: Reported on 10/11/2022), Disp: 30 tablet, Rfl: 0   ipratropium-albuterol (DUONEB) 0.5-2.5 (3) MG/3ML SOLN, Inhale into the lungs., Disp: , Rfl:    irbesartan (AVAPRO) 150 MG tablet, Take 1 tablet (150 mg total) by mouth daily. (Patient not taking: Reported on 10/11/2022), Disp: 30 tablet, Rfl: 1   irbesartan (AVAPRO) 300 MG tablet, Take by mouth., Disp: , Rfl:    Melatonin-Pyridoxine (MELATONIN-B6 PO), Take 1 tablet by mouth at bedtime., Disp: , Rfl:    meloxicam (MOBIC) 15 MG tablet, Take by mouth., Disp: , Rfl:    memantine (NAMENDA) 10 MG tablet, Take 10 mg by mouth daily. (Patient not taking: Reported on 10/11/2022), Disp: , Rfl:    memantine (NAMENDA) 10 MG tablet, Take 1 tablet by mouth every morning., Disp: , Rfl:    metFORMIN (GLUCOPHAGE) 500 MG tablet, Take 500 mg by mouth daily with breakfast., Disp: , Rfl:    polyethylene glycol (MIRALAX / GLYCOLAX) 17 g packet, Take 17 g by mouth daily as needed., Disp: 14 each, Rfl: 0   potassium chloride (KLOR-CON)  20 MEQ packet, Take 40 mEq by mouth daily for 7 days., Disp: 14 packet, Rfl: 0   pregabalin (LYRICA) 150 MG capsule, Take 1 capsule by mouth at bedtime., Disp: , Rfl:    pregabalin (LYRICA) 300 MG capsule, Take 200 mg by mouth 2 (two) times daily. (Patient not taking: Reported on 10/11/2022), Disp: , Rfl:    pyridOXINE (B-6) 50 MG tablet, Take by mouth., Disp: , Rfl:    rosuvastatin (CRESTOR) 10 MG tablet, Take 10 mg by mouth daily., Disp: , Rfl:    Semaglutide,0.25 or 0.5MG /DOS, 2 MG/3ML SOPN, Inject into the skin. (Patient not taking: Reported on 10/11/2022), Disp: , Rfl:    senna-docusate (SENOKOT-S) 8.6-50 MG tablet, Take by mouth., Disp: , Rfl:    Spacer/Aero-Holding Chambers  (AEROCHAMBER MV) inhaler, Use as instructed, Disp: 1 each, Rfl: 2   spironolactone (ALDACTONE) 25 MG tablet, Take by mouth., Disp: , Rfl:    tamsulosin (FLOMAX) 0.4 MG CAPS capsule, Take 0.4 mg by mouth 2 (two) times a day. , Disp: , Rfl:    tiotropium (SPIRIVA) 18 MCG inhalation capsule, Place 18 mcg into inhaler and inhale daily., Disp: , Rfl:    traZODone (DESYREL) 50 MG tablet, Take 1 tablet (50 mg total) by mouth at bedtime., Disp: 30 tablet, Rfl: 0  Past Medical History: Past Medical History:  Diagnosis Date   AKI (acute kidney injury) (HCC) 07/26/2022   Arthritis    Rheumatoid   Complication of anesthesia    has become combative following surgery   Diabetes mellitus without complication (HCC)    Type 2   History of kidney stones    x 2   History of shingles    Hypercholesteremia    Hypertension    Neuropathy    Pancreatitis    PTSD (post-traumatic stress disorder)    Sleep apnea    CPAP machine   Vertigo    occasional    Tobacco Use: Social History   Tobacco Use  Smoking Status Former   Current packs/day: 0.00   Average packs/day: 0.3 packs/day for 30.1 years (7.5 ttl pk-yrs)   Types: Cigarettes   Start date: 05/08/1968   Quit date: 06/17/1998   Years since quitting: 24.5  Smokeless Tobacco Never    Labs: Review Flowsheet  More data exists      Latest Ref Rng & Units 03/14/2016 08/21/2018 11/15/2018 07/22/2022 07/26/2022  Labs for ITP Cardiac and Pulmonary Rehab  Cholestrol 0 - 200 mg/dL - 016     010  - -  LDL (calc) 0 - 99 mg/dL - 932     355  - -  HDL-C >40 mg/dL - 67     62  - -  Trlycerides <150 mg/dL - 732     57  - -  Hemoglobin A1c 4.8 - 5.6 % 6.8  6.7     6.0  - 8.7   Bicarbonate 20.0 - 28.0 mmol/L - - - 27.9  -  O2 Saturation % - - - 58.5  -    Details       This result is from an external source.          Exercise Target Goals: Exercise Program Goal: Individual exercise prescription set using results from initial 6 min walk test and THRR  while considering  patient's activity barriers and safety.   Exercise Prescription Goal: Initial exercise prescription builds to 30-45 minutes a day of aerobic activity, 2-3 days per week.  Home exercise guidelines  will be given to patient during program as part of exercise prescription that the participant will acknowledge.   Education: Aerobic Exercise: - Group verbal and visual presentation on the components of exercise prescription. Introduces F.I.T.T principle from ACSM for exercise prescriptions.  Reviews F.I.T.T. principles of aerobic exercise including progression. Written material given at graduation. Flowsheet Row Cardiac Rehab from 12/13/2022 in [facility] Cardiac and Pulmonary Rehab  Education need identified 10/17/22       Education: Resistance Exercise: - Group verbal and visual presentation on the components of exercise prescription. Introduces F.I.T.T principle from ACSM for exercise prescriptions  Reviews F.I.T.T. principles of resistance exercise including progression. Written material given at graduation. Flowsheet Row Cardiac Rehab from 12/13/2022 in [facility] Cardiac and Pulmonary Rehab  Date 10/25/22  Educator NT  Instruction Review Code 1- Verbalizes Understanding        Education: Exercise & Equipment Safety: - Individual verbal instruction and demonstration of equipment use and safety with use of the equipment. Flowsheet Row Cardiac Rehab from 12/13/2022 in [facility] Cardiac and Pulmonary Rehab  Date 10/17/22  Educator [facility]  Instruction Review Code 1- Verbalizes Understanding       Education: Exercise Physiology & General Exercise Guidelines: - Group verbal and written instruction with models to review the exercise physiology of the cardiovascular system and associated critical values. Provides general exercise guidelines with specific guidelines to those with heart or lung disease.  Flowsheet Row Cardiac Rehab from 12/13/2022 in [facility] Cardiac and Pulmonary Rehab  Date 12/13/22   Educator SB  Instruction Review Code 1- [employer] Understanding       Education: Flexibility, Balance, Mind/Body Relaxation: - Group verbal and visual presentation with interactive activity on the components of exercise prescription. Introduces F.I.T.T principle from ACSM for exercise prescriptions. Reviews F.I.T.T. principles of flexibility and balance exercise training including progression. Also discusses the mind body connection.  Reviews various relaxation techniques to help reduce and manage stress (i.e. Deep breathing, progressive muscle relaxation, and visualization). Balance handout provided to take home. Written material given at graduation. Flowsheet Row Cardiac Rehab from 12/13/2022 in [facility] Cardiac and Pulmonary Rehab  Date 10/25/22  Educator NT  Instruction Review Code 1- Verbalizes Understanding       Activity Barriers & Risk Stratification:  Activity Barriers & Cardiac Risk Stratification - 10/17/22 1120       Activity Barriers & Cardiac Risk Stratification   Activity Barriers Deconditioning;Muscular Weakness;Back Problems;Balance Concerns;Assistive Device;Other (comment)    Comments R hand cold and numbness, back pain with standing    Cardiac Risk Stratification High             6 Minute Walk:  6 Minute Walk     Row Name 10/17/22 1120         6 Minute Walk   Phase Initial     Distance 1180 feet     Walk Time 6 minutes     # of Rest Breaks 0     MPH 2.23     METS 2.24     RPE 11     VO2 Peak 7.85     Symptoms No     Resting HR 72 bpm     Resting BP 124/72     Resting Oxygen Saturation  99 %     Exercise Oxygen Saturation  during 6 min walk 95 %     Max Ex. HR 98 bpm     Max Ex. BP 128/64     2 Minute Post BP 124/72  Oxygen Initial Assessment:   Oxygen Re-Evaluation:   Oxygen Discharge (Final Oxygen Re-Evaluation):   Initial Exercise Prescription:  Initial Exercise Prescription - 10/17/22 1100       Date of Initial  Exercise RX and Referring Provider   Date 10/17/22    Referring Provider Nelly Laurence MD      Oxygen   Maintain Oxygen Saturation 88% or higher      Treadmill   MPH 2    Grade 0.5    Minutes 15    METs 2.67      Recumbant Bike   Level 1    RPM 50    Watts 7    Minutes 15    METs 2.5      NuStep   Level 1    SPM 80    Minutes 15    METs 2.5      REL-XR   Level 1    Speed 50    Minutes 15    METs 2.5      Track   Laps 30    Minutes 15    METs 2.63      Prescription Details   Frequency (times per week) 3    Duration Progress to 30 minutes of continuous aerobic without signs/symptoms of physical distress      Intensity   THRR 40-80% of Max Heartrate 102-132    Ratings of Perceived Exertion 11-13    Perceived Dyspnea 0-4      Progression   Progression Continue to progress workloads to maintain intensity without signs/symptoms of physical distress.      Resistance Training   Training Prescription Yes    Weight 3 lb             Perform Capillary Blood Glucose checks as needed.  Exercise Prescription Changes:   Exercise Prescription Changes     Row Name 10/17/22 1100 11/01/22 0800 11/15/22 1600 11/29/22 0700       Response to Exercise   Blood Pressure (Admit) 124/72 122/68 112/64 132/80    Blood Pressure (Exercise) 128/64 148/78 148/80 136/66    Blood Pressure (Exit) 124/72 122/70 122/64 118/64    Heart Rate (Admit) 72 bpm 79 bpm 81 bpm 88 bpm    Heart Rate (Exercise) 98 bpm 99 bpm 97 bpm 93 bpm    Heart Rate (Exit) 79 bpm 73 bpm 75 bpm 85 bpm    Oxygen Saturation (Admit) 99 % -- -- --    Oxygen Saturation (Exercise) 95 % -- -- --    Rating of Perceived Exertion (Exercise) 11 13 13 13     Symptoms none none -- --    Comments walk test results First two weeks of exercise -- --    Duration -- Continue with 30 min of aerobic exercise without signs/symptoms of physical distress. Continue with 30 min of aerobic exercise without signs/symptoms of  physical distress. Continue with 30 min of aerobic exercise without signs/symptoms of physical distress.    Intensity -- THRR unchanged THRR unchanged THRR unchanged      Progression   Progression -- Continue to progress workloads to maintain intensity without signs/symptoms of physical distress. Continue to progress workloads to maintain intensity without signs/symptoms of physical distress. Continue to progress workloads to maintain intensity without signs/symptoms of physical distress.    Average METs -- 1.78 1.74 2.02      Resistance Training   Training Prescription -- Yes Yes Yes    Weight -- 3 lb 0 lb  0 lb    Reps -- 10-15 10-15 10-15      Interval Training   Interval Training -- No No No      Treadmill   MPH -- 1.2 -- --    Grade -- 0 -- --    Minutes -- 15 -- --    METs -- 1.92 -- --      Recumbant Bike   Level -- 2 -- --    Minutes -- 15 -- --    METs -- 2.1 -- --      NuStep   Level -- 5 5 3     Minutes -- 15 15 15     METs -- 1 1.5 2.7      REL-XR   Level -- 1 1 1     Minutes -- 15 15 15     METs -- 2.1 2 2       Track   Laps -- 14 14 --    Minutes -- 15 15 --    METs -- 1.76 1.76 --      Oxygen   Maintain Oxygen Saturation -- 88% or higher 88% or higher 88% or higher             Exercise Comments:   Exercise Comments     Row Name 10/18/22 1037           Exercise Comments First full day of exercise!  Patient was oriented to gym and equipment including functions, settings, policies, and procedures.  Patient's individual exercise prescription and treatment plan were reviewed.  All starting workloads were established based on the results of the 6 minute walk test done at initial orientation visit.  The plan for exercise progression was also introduced and progression will be customized based on patient's performance and goals.                Exercise Goals and Review:   Exercise Goals     Row Name 10/17/22 1137             Exercise Goals    Increase Physical Activity Yes       Intervention Provide advice, education, support and counseling about physical activity/exercise needs.;Develop an individualized exercise prescription for aerobic and resistive training based on initial evaluation findings, risk stratification, comorbidities and participant's personal goals.       Expected Outcomes Short Term: Attend rehab on a regular basis to increase amount of physical activity.;Long Term: Add in home exercise to make exercise part of routine and to increase amount of physical activity.;Long Term: Exercising regularly at least 3-5 days a week.       Increase Strength and Stamina Yes       Intervention Provide advice, education, support and counseling about physical activity/exercise needs.;Develop an individualized exercise prescription for aerobic and resistive training based on initial evaluation findings, risk stratification, comorbidities and participant's personal goals.       Expected Outcomes Short Term: Increase workloads from initial exercise prescription for resistance, speed, and METs.;Short Term: Perform resistance training exercises routinely during rehab and add in resistance training at home;Long Term: Improve cardiorespiratory fitness, muscular endurance and strength as measured by increased METs and functional capacity ( )       Able to understand and use rate of perceived exertion (RPE) scale Yes       Intervention Provide education and explanation on how to use RPE scale       Expected Outcomes Long Term:  Able to use RPE to  guide intensity level when exercising independently;Short Term: Able to use RPE daily in rehab to express subjective intensity level       Able to understand and use Dyspnea scale Yes       Intervention Provide education and explanation on how to use Dyspnea scale       Expected Outcomes Short Term: Able to use Dyspnea scale daily in rehab to express subjective sense of shortness of breath during  exertion;Long Term: Able to use Dyspnea scale to guide intensity level when exercising independently       Knowledge and understanding of Target Heart Rate Range (THRR) Yes       Intervention Provide education and explanation of THRR including how the numbers were predicted and where they are located for reference       Expected Outcomes Short Term: Able to state/look up THRR;Short Term: Able to use daily as guideline for intensity in rehab;Long Term: Able to use THRR to govern intensity when exercising independently       Able to check pulse independently Yes       Intervention Provide education and demonstration on how to check pulse in carotid and radial arteries.;Review the importance of being able to check your own pulse for safety during independent exercise       Expected Outcomes Short Term: Able to explain why pulse checking is important during independent exercise;Long Term: Able to check pulse independently and accurately       Understanding of Exercise Prescription Yes       Intervention Provide education, explanation, and written materials on patient's individual exercise prescription       Expected Outcomes Short Term: Able to explain program exercise prescription;Long Term: Able to explain home exercise prescription to exercise independently                Exercise Goals Re-Evaluation :  Exercise Goals Re-Evaluation     Row Name 10/18/22 1038 11/01/22 0858 11/15/22 1631 11/29/22 0747       Exercise Goal Re-Evaluation   Exercise Goals Review Increase Physical Activity;Able to understand and use rate of perceived exertion (RPE) scale;Knowledge and understanding of Target Heart Rate Range (THRR);Understanding of Exercise Prescription;Increase Strength and Stamina;Able to check pulse independently Increase Physical Activity;Increase Strength and Stamina;Understanding of Exercise Prescription Increase Physical Activity;Increase Strength and Stamina;Understanding of Exercise  Prescription Understanding of Exercise Prescription    Comments Reviewed RPE  and dyspnea scale, THR and program prescription with pt today.  Pt voiced understanding and was given a copy of goals to take home. Ron is off to a good start in the program. He was able to walk up to 1.2 mph with no incline on the treadmill. He also improved to level 5 on the T4 nustep and level 2 on the recumbent bike. We will continue to monitor his progress in the program. Ron continues to exercise at same loads as last review. He is not using weight during resistive work. Will encourage him to increase some wrokloads at tolerated with goals of increasing strength and stamina.  Will continue to monitor exercise progression Ron continues to work oat or less levels than last review. HIs RPE is at 13. He continues to do ROM during resistive work.  WIll encourage him to increase at leaset one workload.  Will continue to monitor exercise progression.    Expected Outcomes Short: Use RPE daily to regulate intensity.  Long: Follow program prescription in THR. Short: Continue to follow initial exercise prescription. Long:  Continue to improve strength and stamina. STG Continue with exercise progression as tolerate with goal of increased strength and stamina.  LTG Continued exercise progression after discharge [id number] workloads on at least one piece of equipment, as tolerated. LTG: Continued exercise progression as tolerated during program and after discharge.             Discharge Exercise Prescription (Final Exercise Prescription Changes):  Exercise Prescription Changes - 11/29/22 0700       Response to Exercise   Blood Pressure (Admit) 132/80    Blood Pressure (Exercise) 136/66    Blood Pressure (Exit) 118/64    Heart Rate (Admit) 88 bpm    Heart Rate (Exercise) 93 bpm    Heart Rate (Exit) 85 bpm    Rating of Perceived Exertion (Exercise) 13    Duration Continue with 30 min of aerobic exercise without signs/symptoms  of physical distress.    Intensity THRR unchanged      Progression   Progression Continue to progress workloads to maintain intensity without signs/symptoms of physical distress.    Average METs 2.02      Resistance Training   Training Prescription Yes    Weight 0 lb    Reps 10-15      Interval Training   Interval Training No      NuStep   Level 3    Minutes 15    METs 2.7      REL-XR   Level 1    Minutes 15    METs 2      Oxygen   Maintain Oxygen Saturation 88% or higher             Nutrition:  Target Goals: Understanding of nutrition guidelines, daily intake of sodium 1500mg , cholesterol 200mg , calories 30% from fat and 7% or less from saturated fats, daily to have 5 or more servings of fruits and vegetables.  Education: All About Nutrition: -Group instruction provided by verbal, written material, interactive activities, discussions, models, and posters to present general guidelines for heart healthy nutrition including fat, fiber, MyPlate, the role of sodium in heart healthy nutrition, utilization of the nutrition label, and utilization of this knowledge for meal planning. Follow up email sent as well. Written material given at graduation. Flowsheet Row Cardiac Rehab from 12/13/2022 in [facility] Cardiac and Pulmonary Rehab  Education need identified 10/17/22       Biometrics:  Pre Biometrics - 10/17/22 1138       Pre Biometrics   Height 5' 7.8" (1.722 m)    Weight 207 lb 11.2 oz (94.2 kg)    Waist Circumference 41 inches    Hip Circumference 41 inches    Waist to Hip Ratio 1 %    BMI (Calculated) 31.77    Single Leg Stand 2.9 seconds              Nutrition Therapy Plan and Nutrition Goals:  Nutrition Therapy & Goals - 10/17/22 1139       Intervention Plan   Intervention Prescribe, educate and counsel regarding individualized specific dietary modifications aiming towards targeted core components such as weight, hypertension, lipid management,  diabetes, heart failure and other comorbidities.    Expected Outcomes Short Term Goal: Understand basic principles of dietary content, such as calories, fat, sodium, cholesterol and nutrients.;Short Term Goal: A plan has been developed with personal nutrition goals set during dietitian appointment.;Long Term Goal: Adherence to prescribed nutrition plan.  Nutrition Assessments:  MEDIFICTS Score Key: ?70 Need to make dietary changes  40-70 Heart Healthy Diet ? 40 Therapeutic Level Cholesterol Diet  Flowsheet Row Cardiac Rehab from 10/17/2022 in [facility] Cardiac and Pulmonary Rehab  Picture Your Plate Total Score on Admission 39      Picture Your Plate Scores: <65 Unhealthy dietary pattern with much room for improvement. 41-50 Dietary pattern unlikely to meet recommendations for good health and room for improvement. 51-60 More healthful dietary pattern, with some room for improvement.  >60 Healthy dietary pattern, although there may be some specific behaviors that could be improved.    Nutrition Goals Re-Evaluation:  Nutrition Goals Re-Evaluation     Row Name 11/06/22 1146 11/27/22 1126           Goals   Comment Patient scheduled an appointment today to meet with program RD. His appointment is 11/20/2022. Patient did not attend his dietitian appintment      Expected Outcome Short: meet with RD to set specific nutrition goals. Long: adapt to a heart healhty diet to help control cardiac risk factors. --               Nutrition Goals Discharge (Final Nutrition Goals Re-Evaluation):  Nutrition Goals Re-Evaluation - 11/27/22 1126       Goals   Comment Patient did not attend his dietitian appintment             Psychosocial: Target Goals: Acknowledge presence or absence of significant depression and/or stress, maximize coping skills, provide positive support system. Participant is able to verbalize types and ability to use techniques and skills needed for  reducing stress and depression.   Education: Stress, Anxiety, and Depression - Group verbal and visual presentation to define topics covered.  Reviews how body is impacted by stress, anxiety, and depression.  Also discusses healthy ways to reduce stress and to treat/manage anxiety and depression.  Written material given at graduation. Flowsheet Row Cardiac Rehab from 12/13/2022 in [facility] Cardiac and Pulmonary Rehab  Date 12/06/22  Educator SB  Instruction Review Code 1- [employer] Understanding       Education: Sleep Hygiene -Provides group verbal and written instruction about how sleep can affect your health.  Define sleep hygiene, discuss sleep cycles and impact of sleep habits. Review good sleep hygiene tips.    Initial Review & Psychosocial Screening:  Initial Psych Review & Screening - 10/11/22 1344       Initial Review   Current issues with Current Anxiety/Panic;Current Psychotropic Meds;Current Depression;History of Depression      Family Dynamics   Good Support System? Yes    Comments He can look to his home nurse for support. His wife is a good supporty system and his son comes by three times a week.      Barriers   Psychosocial barriers to participate in program The patient should benefit from training in stress management and relaxation.;There are no identifiable barriers or psychosocial needs.      Screening Interventions   Interventions To provide support and resources with identified psychosocial needs;Provide feedback about the scores to participant    Expected Outcomes Short Term goal: Utilizing psychosocial counselor, staff and physician to assist with identification of specific Stressors or current issues interfering with healing process. Setting desired goal for each stressor or current issue identified.;Long Term Goal: Stressors or current issues are controlled or eliminated.;Short Term goal: Identification and review with participant of any Quality of Life or  Depression concerns found by scoring the  questionnaire.;Long Term goal: The participant improves quality of Life and PHQ9 Scores as seen by post scores and/or verbalization of changes             Quality of Life Scores:   Quality of Life - 10/17/22 1139       Quality of Life   Select Quality of Life      Quality of Life Scores   Health/Function Pre 19.67 %    Socioeconomic Pre 26.4 %    Psych/Spiritual Pre 25.71 %    Family Pre 28.8 %    GLOBAL Pre 23.47 %            Scores of 19 and below usually indicate a poorer quality of life in these areas.  A difference of  2-3 points is a clinically meaningful difference.  A difference of 2-3 points in the total score of the Quality of Life Index has been associated with significant improvement in overall quality of life, self-image, physical symptoms, and general health in studies assessing change in quality of life.  PHQ-9: Review Flowsheet  More data exists      11/27/2022 11/06/2022 10/17/2022 11/25/2018 05/29/2016  Depression screen PHQ 2/9  Decreased Interest 1 1 0 0 0  Down, Depressed, Hopeless 0 2 1 0 0  PHQ - 2 Score 1 3 1  0 0  Altered sleeping 0 2 0 0 -  Tired, decreased energy 0 2 1 0 -  Change in appetite 0 2 0 0 -  Feeling bad or failure about yourself  0 0 1 0 -  Trouble concentrating 1 0 1 0 -  Moving slowly or fidgety/restless 0 0 2 0 -  Suicidal thoughts 0 0 0 0 -  PHQ-9 Score 2 9 6  0 -  Difficult doing work/chores Not difficult at all Not difficult at all Somewhat difficult Not difficult at all -    Details           Interpretation of Total Score  Total Score Depression Severity:  1-4 = Minimal depression, 5-9 = Mild depression, 10-14 = Moderate depression, 15-19 = Moderately severe depression, 20-27 = Severe depression   Psychosocial Evaluation and Intervention:  Psychosocial Evaluation - 10/11/22 1346       Psychosocial Evaluation & Interventions   Interventions Encouraged to exercise with the  program and follow exercise prescription;Relaxation education;Stress management education    Comments He can look to his home nurse for support. His wife is a good supporty system and his son comes by three times a week.    Expected Outcomes Short: Start [facility] to help with mood. Long: Maintain a healthy mental state    Continue Psychosocial Services  Follow up required by staff             Psychosocial Re-Evaluation:  Psychosocial Re-Evaluation     Row Name 11/06/22 1149 11/27/22 1131           Psychosocial Re-Evaluation   Current issues with Current Psychotropic Meds;Current Depression Current Psychotropic Meds;Current Depression      Comments Patient reports that he has been on mood medications for a long time and that he follow up with his doctor if new concerns arise. He currently takes all his meds a presrcibed and feels like he is maintaining his mental health. Reviewed patient health questionnaire (PHQ-9) with patient for follow up. Previously, patients score indicated signs/symptoms of depression.  Reviewed to see if patient is improving symptom wise while in program.  Score improved  and patient states that it is because he is able to come to rehab and enjoys it.      Expected Outcomes Short: continue to take mood medications and follow up with doctor if any changes or concerns arise. Long: maintain good mental health habits to help control a stable mood. Short: Continue to attend [facility] regularly for regular exercise and social engagement. Long: Continue to improve symptoms and manage a positive mental state.      Interventions Encouraged to attend Cardiac Rehabilitation for the exercise Encouraged to attend Cardiac Rehabilitation for the exercise      Continue Psychosocial Services  Follow up required by staff Follow up required by staff               Psychosocial Discharge (Final Psychosocial Re-Evaluation):  Psychosocial Re-Evaluation - 11/27/22 1131        Psychosocial Re-Evaluation   Current issues with Current Psychotropic Meds;Current Depression    Comments Reviewed patient health questionnaire (PHQ-9) with patient for follow up. Previously, patients score indicated signs/symptoms of depression.  Reviewed to see if patient is improving symptom wise while in program.  Score improved and patient states that it is because he is able to come to rehab and enjoys it.    Expected Outcomes Short: Continue to attend [facility] regularly for regular exercise and social engagement. Long: Continue to improve symptoms and manage a positive mental state.    Interventions Encouraged to attend Cardiac Rehabilitation for the exercise    Continue Psychosocial Services  Follow up required by staff             Vocational Rehabilitation: Provide vocational rehab assistance to qualifying candidates.   Vocational Rehab Evaluation & Intervention:   Education: Education Goals: Education classes will be provided on a variety of topics geared toward better understanding of heart health and risk factor modification. Participant will state understanding/return demonstration of topics presented as noted by education test scores.  Learning Barriers/Preferences:  Learning Barriers/Preferences - 10/11/22 1343       Learning Barriers/Preferences   Learning Barriers None    Learning Preferences None             General Cardiac Education Topics:  AED/CPR: - Group verbal and written instruction with the use of models to demonstrate the basic use of the AED with the basic ABC's of resuscitation.   Anatomy and Cardiac Procedures: - Group verbal and visual presentation and models provide information about basic cardiac anatomy and function. Reviews the testing methods done to diagnose heart disease and the outcomes of the test results. Describes the treatment choices: Medical Management, Angioplasty, or Coronary Bypass Surgery for treating various heart  conditions including Myocardial Infarction, Angina, Valve Disease, and Cardiac Arrhythmias.  Written material given at graduation. Flowsheet Row Cardiac Rehab from 12/13/2022 in [facility] Cardiac and Pulmonary Rehab  Education need identified 10/17/22  Date 11/08/22  Educator SB  Instruction Review Code 1- Verbalizes Understanding       Medication Safety: - Group verbal and visual instruction to review commonly prescribed medications for heart and lung disease. Reviews the medication, class of the drug, and side effects. Includes the steps to properly store meds and maintain the prescription regimen.  Written material given at graduation.   Intimacy: - Group verbal instruction through game format to discuss how heart and lung disease can affect sexual intimacy. Written material given at graduation..   Know Your Numbers and Heart Failure: - Group verbal and visual instruction to discuss disease  risk factors for cardiac and pulmonary disease and treatment options.  Reviews associated critical values for Overweight/Obesity, Hypertension, Cholesterol, and Diabetes.  Discusses basics of heart failure: signs/symptoms and treatments.  Introduces Heart Failure Zone chart for action plan for heart failure.  Written material given at graduation. Flowsheet Row Cardiac Rehab from 12/13/2022 in [facility] Cardiac and Pulmonary Rehab  Education need identified 10/17/22  Date 11/22/22  Educator SB  Instruction Review Code 1- Verbalizes Understanding       Infection Prevention: - Provides verbal and written material to individual with discussion of infection control including proper hand washing and proper equipment cleaning during exercise session. Flowsheet Row Cardiac Rehab from 12/13/2022 in [facility] Cardiac and Pulmonary Rehab  Date 10/17/22  Educator [facility]  Instruction Review Code 1- Verbalizes Understanding       Falls Prevention: - Provides verbal and written material to individual with discussion of falls  prevention and safety. Flowsheet Row Cardiac Rehab from 12/13/2022 in [facility] Cardiac and Pulmonary Rehab  Date 10/17/22  Educator [facility]  Instruction Review Code 1- Verbalizes Understanding       Other: -Provides group and verbal instruction on various topics (see comments) Flowsheet Row Cardiac Rehab from 12/13/2022 in [facility] Cardiac and Pulmonary Rehab  Date 10/18/22  Educator [facility]  Instruction Review Code 1- Verbalizes Understanding       Knowledge Questionnaire Score:  Knowledge Questionnaire Score - 10/17/22 1140       Knowledge Questionnaire Score   Pre Score 20/26             Core Components/Risk Factors/Patient Goals at Admission:  Personal Goals and Risk Factors at Admission - 10/17/22 1140       Core Components/Risk Factors/Patient Goals on Admission    Weight Management Yes;Weight Loss;Obesity    Intervention Weight Management: Develop a combined nutrition and exercise program designed to reach desired caloric intake, while maintaining appropriate intake of nutrient and fiber, sodium and fats, and appropriate energy expenditure required for the weight goal.;Weight Management: Provide education and appropriate resources to help participant work on and attain dietary goals.;Weight Management/Obesity: Establish reasonable short term and long term weight goals.;Obesity: Provide education and appropriate resources to help participant work on and attain dietary goals.    Admit Weight 207 lb 11.2 oz (94.2 kg)    Goal Weight: Short Term 202 lb (91.6 kg)    Goal Weight: Long Term 197 lb (89.4 kg)    Expected Outcomes Short Term: Continue to assess and modify interventions until short term weight is achieved;Long Term: Adherence to nutrition and physical activity/exercise program aimed toward attainment of established weight goal;Weight Loss: Understanding of general recommendations for a balanced deficit meal plan, which promotes 1-2 lb weight loss per week and includes a  negative energy balance of [phone] kcal/d;Understanding recommendations for meals to include 15-35% energy as protein, 25-35% energy from fat, 35-60% energy from carbohydrates, less than 200mg  of dietary cholesterol, 20-35 gm of total fiber daily;Understanding of distribution of calorie intake throughout the day with the consumption of 4-5 meals/snacks    Diabetes Yes    Intervention Provide education about signs/symptoms and action to take for hypo/hyperglycemia.;Provide education about proper nutrition, including hydration, and aerobic/resistive exercise prescription along with prescribed medications to achieve blood glucose in normal ranges: Fasting glucose 65-99 mg/dL    Expected Outcomes Short Term: Participant verbalizes understanding of the signs/symptoms and immediate care of hyper/hypoglycemia, proper foot care and importance of medication, aerobic/resistive exercise and nutrition plan for blood glucose control.;Long  Term: Attainment of HbA1C < 7%.    Heart Failure Yes    Intervention Provide a combined exercise and nutrition program that is supplemented with education, support and counseling about heart failure. Directed toward relieving symptoms such as shortness of breath, decreased exercise tolerance, and extremity edema.    Expected Outcomes Improve functional capacity of life;Short term: Attendance in program 2-3 days a week with increased exercise capacity. Reported lower sodium intake. Reported increased fruit and vegetable intake. Reports medication compliance.;Short term: Daily weights obtained and reported for increase. Utilizing diuretic protocols set by physician.;Long term: Adoption of self-care skills and reduction of barriers for early signs and symptoms recognition and intervention leading to self-care maintenance.    Hypertension Yes    Intervention Provide education on lifestyle modifcations including regular physical activity/exercise, weight management, moderate sodium  restriction and increased consumption of fresh fruit, vegetables, and low fat dairy, alcohol moderation, and smoking cessation.;Monitor prescription use compliance.    Expected Outcomes Short Term: Continued assessment and intervention until BP is < 140/17mm HG in hypertensive participants. < 130/62mm HG in hypertensive participants with diabetes, heart failure or chronic kidney disease.;Long Term: Maintenance of blood pressure at goal levels.    Lipids Yes    Intervention Provide education and support for participant on nutrition & aerobic/resistive exercise along with prescribed medications to achieve LDL 70mg , HDL >40mg .    Expected Outcomes Short Term: Participant states understanding of desired cholesterol values and is compliant with medications prescribed. Participant is following exercise prescription and nutrition guidelines.;Long Term: Cholesterol controlled with medications as prescribed, with individualized exercise RX and with personalized nutrition plan. Value goals: LDL < 70mg , HDL > 40 mg.             Education:Diabetes - Individual verbal and written instruction to review signs/symptoms of diabetes, desired ranges of glucose level fasting, after meals and with exercise. Acknowledge that pre and post exercise glucose checks will be done for 3 sessions at entry of program. Flowsheet Row Cardiac Rehab from 12/13/2022 in [facility] Cardiac and Pulmonary Rehab  Date 10/17/22  Educator [facility]  Instruction Review Code 1- Verbalizes Understanding       Core Components/Risk Factors/Patient Goals Review:   Goals and Risk Factor Review     Row Name 11/06/22 1131 11/27/22 1133           Core Components/Risk Factors/Patient Goals Review   Personal Goals Review Weight Management/Obesity;Diabetes;Hypertension Weight Management/Obesity      Review Patient reports that he has been losing weight. He is on Ozempic for DM. He takes all his meds as prescribed and has a home health provider come to  his house once a month to help him manage and set up his meds for the month. The provider also takes his BP and he monitors it independently inbetween visits. Lorentz wants to lose a litle more weight and reach a goal of 190lbs. He states he is doing well in the program and enjoys it. His weight was 204 pounds today. His blood pressure is doing well and checks is blood pressure at home.      Expected Outcomes Short: continue to monitor BP and work with home health care provider on managing medications. Long: continue to control cardiac risk factors. Short: lose a few pounds in the next couple weeks. Long: reach weight.               Core Components/Risk Factors/Patient Goals at Discharge (Final Review):   Goals and Risk Factor Review -  11/27/22 1133       Core Components/Risk Factors/Patient Goals Review   Personal Goals Review Weight Management/Obesity    Review Kaplan wants to lose a litle more weight and reach a goal of 190lbs. He states he is doing well in the program and enjoys it. His weight was 204 pounds today. His blood pressure is doing well and checks is blood pressure at home.    Expected Outcomes Short: lose a few pounds in the next couple weeks. Long: reach weight.             ITP Comments:  ITP Comments     Row Name 10/11/22 1346 10/17/22 1120 10/18/22 1037 10/18/22 1154 11/14/22 1403   ITP Comments Virtual Visit completed. Patient informed on EP and RD appointment and 6 Minute walk test. Patient also informed of patient health questionnaires on My Chart. Patient Verbalizes understanding. Visit diagnosis can be found in [facility] 07/22/2022. Completed and gym orientation. Initial ITP created and sent for review to Dr. Bethann Punches, Medical Director. First full day of exercise!  Patient was oriented to gym and equipment including functions, settings, policies, and procedures.  Patient's individual exercise prescription and treatment plan were reviewed.  All starting workloads  were established based on the results of the 6 minute walk test done at initial orientation visit.  The plan for exercise progression was also introduced and progression will be customized based on patient's performance and goals. 30 Day review completed. Medical Director ITP review done, changes made as directed, and signed approval by Medical Director.   new to program 30 Day review completed. Medical Director ITP review done, changes made as directed, and signed approval by Medical Director.    Row Name 12/13/22 1125           ITP Comments 30 Day review completed. Medical Director ITP review done, changes made as directed, and signed approval by Medical Director.                Comments:

## 2022-12-15 ENCOUNTER — Encounter: Payer: No Typology Code available for payment source | Admitting: *Deleted

## 2022-12-15 DIAGNOSIS — I5022 Chronic systolic (congestive) heart failure: Secondary | ICD-10-CM | POA: Diagnosis not present

## 2022-12-15 NOTE — Progress Notes (Signed)
Daily Session Note  Patient Details  Name: IYON HOWES MRN: [id number] Date of Birth: [birth year] Referring Provider:   Flowsheet Row Cardiac Rehab from 10/17/2022 in [facility] Cardiac and Pulmonary Rehab  Referring Provider Nelly Laurence MD       Encounter Date: 12/15/2022  Check In:  Session Check In - 12/15/22 1147       Check-In   Supervising physician immediately available to respond to emergencies See telemetry face sheet for immediately available ER MD    Location [facility]-Cardiac & Pulmonary Rehab    Staff Present Cora Collum, RN, BSN, CCRP;Joseph Hood, RCP,RRT,BSRT;Noah Tickle, [place], Exercise Physiologist    Virtual Visit No    Medication changes reported     No    Fall or balance concerns reported    No    Warm-up and Cool-down Performed on first and last piece of equipment    Resistance Training Performed Yes    VAD Patient? No    PAD/SET Patient? No      Pain Assessment   Currently in Pain? No/denies                Social History   Tobacco Use  Smoking Status Former   Current packs/day: 0.00   Average packs/day: 0.3 packs/day for 30.1 years (7.5 ttl pk-yrs)   Types: Cigarettes   Start date: 05/08/1968   Quit date: 06/17/1998   Years since quitting: 24.5  Smokeless Tobacco Never    Goals Met:  Proper associated with RPD/PD & O2 Sat Independence with exercise equipment Exercise tolerated well No report of concerns or symptoms today  Goals Unmet:  Not Applicable  Comments: Pt able to follow exercise prescription today without complaint.  Will continue to monitor for progression.    Dr. Bethann Punches is Medical Director for [facility] Cardiac Rehabilitation.  Dr. Vida Rigger is Medical Director for [facility] Pulmonary Rehabilitation.

## 2022-12-18 ENCOUNTER — Encounter: Payer: No Typology Code available for payment source | Admitting: *Deleted

## 2022-12-18 DIAGNOSIS — I5022 Chronic systolic (congestive) heart failure: Secondary | ICD-10-CM | POA: Diagnosis not present

## 2022-12-18 NOTE — Progress Notes (Signed)
Daily Session Note  Patient Details  Name: Ian Lucas MRN: [id number] Date of Birth: 07-Oct-1950 Referring Provider:   Flowsheet Row Cardiac Rehab from 10/17/2022 in [facility] Cardiac and Pulmonary Rehab  Referring Provider Nelly Laurence MD       Encounter Date: 12/18/2022  Check In:  Session Check In - 12/18/22 1114       Check-In   Supervising physician immediately available to respond to emergencies See telemetry face sheet for immediately available ER MD    Location [facility]-Cardiac & Pulmonary Rehab    Staff Present Lanny Hurst, RN, Franki Monte, BS, ACSM CEP, Exercise Physiologist;Other;Meredith Jewel Baize, RN BSN   Girtha Rm, MS   Virtual Visit No    Medication changes reported     No    Fall or balance concerns reported    No    Warm-up and Cool-down Performed on first and last piece of equipment    Resistance Training Performed Yes    VAD Patient? No    PAD/SET Patient? No      Pain Assessment   Currently in Pain? No/denies                Social History   Tobacco Use  Smoking Status Former   Current packs/day: 0.00   Average packs/day: 0.3 packs/day for 30.1 years (7.5 ttl pk-yrs)   Types: Cigarettes   Start date: 05/08/1968   Quit date: 06/17/1998   Years since quitting: 24.5  Smokeless Tobacco Never    Goals Met:  Independence with exercise equipment Exercise tolerated well No report of concerns or symptoms today Strength training completed today  Goals Unmet:  Not Applicable  Comments: Pt able to follow exercise prescription today without complaint.  Will continue to monitor for progression.    Dr. Bethann Punches is Medical Director for [facility] Cardiac Rehabilitation.  Dr. Vida Rigger is Medical Director for [facility] Pulmonary Rehabilitation.

## 2022-12-20 ENCOUNTER — Encounter: Payer: No Typology Code available for payment source | Admitting: *Deleted

## 2022-12-20 DIAGNOSIS — I5022 Chronic systolic (congestive) heart failure: Secondary | ICD-10-CM | POA: Diagnosis not present

## 2022-12-20 NOTE — Progress Notes (Signed)
Daily Session Note  Patient Details  Name: PATIENCE BENION MRN: [id number] Date of Birth: [birth year] Referring Provider:   Flowsheet Row Cardiac Rehab from 10/17/2022 in [facility] Cardiac and Pulmonary Rehab  Referring Provider Nelly Laurence MD       Encounter Date: 12/20/2022  Check In:  Session Check In - 12/20/22 1033       Check-In   Supervising physician immediately available to respond to emergencies See telemetry face sheet for immediately available ER MD    Location [facility]-Cardiac & Pulmonary Rehab    Staff Present Rory Percy, MS, Exercise Physiologist;Maxon Conetta BS, , Exercise Physiologist;Robson Trickey Katrinka Blazing, RN, ADN;Meredith Jewel Baize, RN BSN    Virtual Visit No    Medication changes reported     No    Fall or balance concerns reported    No    Warm-up and Cool-down Performed on first and last piece of equipment    Resistance Training Performed Yes    VAD Patient? No    PAD/SET Patient? No      Pain Assessment   Currently in Pain? No/denies                Social History   Tobacco Use  Smoking Status Former   Current packs/day: 0.00   Average packs/day: 0.3 packs/day for 30.1 years (7.5 ttl pk-yrs)   Types: Cigarettes   Start date: 05/08/1968   Quit date: 06/17/1998   Years since quitting: 24.5  Smokeless Tobacco Never    Goals Met:  Independence with exercise equipment Exercise tolerated well No report of concerns or symptoms today Strength training completed today  Goals Unmet:  Not Applicable  Comments: Pt able to follow exercise prescription today without complaint.  Will continue to monitor for progression.    Dr. Bethann Punches is Medical Director for [facility] Cardiac Rehabilitation.  Dr. Vida Rigger is Medical Director for [facility] Pulmonary Rehabilitation.

## 2022-12-22 ENCOUNTER — Encounter: Payer: No Typology Code available for payment source | Admitting: *Deleted

## 2022-12-22 DIAGNOSIS — I5022 Chronic systolic (congestive) heart failure: Secondary | ICD-10-CM

## 2022-12-22 NOTE — Progress Notes (Signed)
Daily Session Note  Patient Details  Name: Ian Lucas MRN: [id number] Date of Birth: 1950-06-12 Referring Provider:   Flowsheet Row Cardiac Rehab from 10/17/2022 in [facility] Cardiac and Pulmonary Rehab  Referring Provider Nelly Laurence MD       Encounter Date: 12/22/2022  Check In:      Social History   Tobacco Use  Smoking Status Former   Current packs/day: 0.00   Average packs/day: 0.3 packs/day for 30.1 years (7.5 ttl pk-yrs)   Types: Cigarettes   Start date: 05/08/1968   Quit date: 06/17/1998   Years since quitting: 24.5  Smokeless Tobacco Never    Goals Met:  Independence with exercise equipment Exercise tolerated well No report of concerns or symptoms today  Goals Unmet:  Not Applicable  Comments: Pt able to follow exercise prescription today without complaint.  Will continue to monitor for progression.    Dr. Bethann Punches is Medical Director for [facility] Cardiac Rehabilitation.  Dr. Vida Rigger is Medical Director for [facility] Pulmonary Rehabilitation.

## 2022-12-25 ENCOUNTER — Encounter: Payer: No Typology Code available for payment source | Admitting: *Deleted

## 2022-12-25 DIAGNOSIS — I5022 Chronic systolic (congestive) heart failure: Secondary | ICD-10-CM | POA: Diagnosis not present

## 2022-12-25 NOTE — Progress Notes (Signed)
Daily Session Note  Patient Details  Name: Ian Lucas MRN: [id number] Date of Birth: May 16, 1950 Referring Provider:   Flowsheet Row Cardiac Rehab from 10/17/2022 in [facility] Cardiac and Pulmonary Rehab  Referring Provider Nelly Laurence MD       Encounter Date: 12/25/2022  Check In:  Session Check In - 12/25/22 1059       Check-In   Supervising physician immediately available to respond to emergencies See telemetry face sheet for immediately available ER MD    Location [facility]-Cardiac & Pulmonary Rehab    Staff Present Rory Percy, MS, Exercise Physiologist;Meredith Jewel Baize, RN Mabeline Caras, BS, ACSM CEP, Exercise Physiologist;Atoya Andrew Katrinka Blazing, RN, ADN    Virtual Visit No    Medication changes reported     No    Fall or balance concerns reported    No    Warm-up and Cool-down Performed on first and last piece of equipment    Resistance Training Performed Yes    VAD Patient? No    PAD/SET Patient? No      Pain Assessment   Currently in Pain? No/denies                Social History   Tobacco Use  Smoking Status Former   Current packs/day: 0.00   Average packs/day: 0.3 packs/day for 30.1 years (7.5 ttl pk-yrs)   Types: Cigarettes   Start date: 05/08/1968   Quit date: 06/17/1998   Years since quitting: 24.5  Smokeless Tobacco Never    Goals Met:  Independence with exercise equipment Exercise tolerated well No report of concerns or symptoms today Strength training completed today  Goals Unmet:  Not Applicable  Comments: Pt able to follow exercise prescription today without complaint.  Will continue to monitor for progression.  Reviewed home exercise with pt today.  Pt plans to walk at home 1-2 days a week on non-class days and possibly join the wellzone for exercise.  Reviewed THR, pulse, RPE, sign and symptoms, pulse oximetery and when to call 911 or MD.  Also discussed weather considerations and indoor options.  Pt voiced understanding.    Dr. Bethann Punches is  Medical Director for [facility] Cardiac Rehabilitation.  Dr. Vida Rigger is Medical Director for [facility] Pulmonary Rehabilitation.

## 2022-12-27 ENCOUNTER — Encounter: Payer: No Typology Code available for payment source | Admitting: *Deleted

## 2023-01-01 ENCOUNTER — Encounter: Payer: No Typology Code available for payment source | Admitting: *Deleted

## 2023-01-01 DIAGNOSIS — I5022 Chronic systolic (congestive) heart failure: Secondary | ICD-10-CM | POA: Diagnosis not present

## 2023-01-01 NOTE — Progress Notes (Signed)
Daily Session Note  Patient Details  Name: CASE LAMMERS MRN: [id number] Date of Birth: 1950/10/21 Referring Provider:   Flowsheet Row Cardiac Rehab from 10/17/2022 in [facility] Cardiac and Pulmonary Rehab  Referring Provider Nelly Laurence MD       Encounter Date: 01/01/2023  Check In:  Session Check In - 01/01/23 1121       Check-In   Supervising physician immediately available to respond to emergencies See telemetry face sheet for immediately available ER MD    Location [facility]-Cardiac & Pulmonary Rehab    Staff Present Rory Percy, MS, Exercise Physiologist;Maxon Suzzette Righter, , Exercise Physiologist;Kelly Madilyn Fireman, BS, ACSM CEP, Exercise Physiologist;Jaleena Viviani Katrinka Blazing, RN, ADN    Virtual Visit No    Medication changes reported     No    Fall or balance concerns reported    No    Warm-up and Cool-down Performed on first and last piece of equipment    Resistance Training Performed Yes    VAD Patient? No    PAD/SET Patient? No      Pain Assessment   Currently in Pain? No/denies                Social History   Tobacco Use  Smoking Status Former   Current packs/day: 0.00   Average packs/day: 0.3 packs/day for 30.1 years (7.5 ttl pk-yrs)   Types: Cigarettes   Start date: 05/08/1968   Quit date: 06/17/1998   Years since quitting: 24.5  Smokeless Tobacco Never    Goals Met:  Independence with exercise equipment Exercise tolerated well No report of concerns or symptoms today Strength training completed today  Goals Unmet:  Not Applicable  Comments: Pt able to follow exercise prescription today without complaint.  Will continue to monitor for progression.    Dr. Bethann Punches is Medical Director for [facility] Cardiac Rehabilitation.  Dr. Vida Rigger is Medical Director for [facility] Pulmonary Rehabilitation.

## 2023-01-03 ENCOUNTER — Encounter: Payer: No Typology Code available for payment source | Admitting: *Deleted

## 2023-01-03 VITALS — Ht 67.8 in | Wt 206.7 lb

## 2023-01-03 DIAGNOSIS — I5022 Chronic systolic (congestive) heart failure: Secondary | ICD-10-CM

## 2023-01-03 NOTE — Patient Instructions (Signed)
Discharge Patient Instructions  Patient Details  Name: Ian Lucas MRN: [id number] Date of Birth: August 30, 1950 Referring Provider:  No ref. provider found   Number of Visits: 36  Reason for Discharge:  Patient reached a stable level of exercise. Patient independent in their exercise. Patient has met program and personal goals.  Diagnosis:  Heart failure, chronic systolic (HCC)  Initial Exercise Prescription:  Initial Exercise Prescription - 10/17/22 1100       Date of Initial Exercise RX and Referring Provider   Date 10/17/22    Referring Provider Nelly Laurence MD      Oxygen   Maintain Oxygen Saturation 88% or higher      Treadmill   MPH 2    Grade 0.5    Minutes 15    METs 2.67      Recumbant Bike   Level 1    RPM 50    Watts 7    Minutes 15    METs 2.5      NuStep   Level 1    SPM 80    Minutes 15    METs 2.5      REL-XR   Level 1    Speed 50    Minutes 15    METs 2.5      Track   Laps 30    Minutes 15    METs 2.63      Prescription Details   Frequency (times per week) 3    Duration Progress to 30 minutes of continuous aerobic without signs/symptoms of physical distress      Intensity   THRR 40-80% of Max Heartrate 102-132    Ratings of Perceived Exertion 11-13    Perceived Dyspnea 0-4      Progression   Progression Continue to progress workloads to maintain intensity without signs/symptoms of physical distress.      Resistance Training   Training Prescription Yes    Weight 3 lb             Discharge Exercise Prescription (Final Exercise Prescription Changes):  Exercise Prescription Changes - 12/26/22 1400       Response to Exercise   Blood Pressure (Admit) 124/70    Blood Pressure (Exit) 140/72    Heart Rate (Admit) 81 bpm    Heart Rate (Exercise) 96 bpm    Heart Rate (Exit) 88 bpm    Oxygen Saturation (Admit) 97 %    Oxygen Saturation (Exercise) 89 %    Oxygen Saturation (Exit) 96 %    Rating of Perceived Exertion  (Exercise) 15    Symptoms none    Duration Continue with 30 min of aerobic exercise without signs/symptoms of physical distress.    Intensity THRR unchanged      Progression   Progression Continue to progress workloads to maintain intensity without signs/symptoms of physical distress.    Average METs 2.2      Resistance Training   Training Prescription Yes    Weight 3    Reps 10-15      Interval Training   Interval Training No      Treadmill   MPH 1.7    Grade 0    Minutes 15    METs 2.3      NuStep   Level 2   T6   Minutes 15    METs 1.8   T6     REL-XR   Level 2    Minutes 15    METs 2.4  Track   Laps 30    Minutes 15    METs 2.63      Home Exercise Plan   Plans to continue exercise at Home (comment)   walk at home   Frequency Add 2 additional days to program exercise sessions.    Initial Home Exercises Provided 12/25/22      Oxygen   Maintain Oxygen Saturation 88% or higher             Functional Capacity:  6 Minute Walk     Row Name 10/17/22 1120 01/03/23 1119       6 Minute Walk   Phase Initial Discharge    Distance 1180 feet 1205 feet    Distance % Change -- 2.1 %    Distance Feet Change -- 25 ft    Walk Time 6 minutes 6 minutes    # of Rest Breaks 0 0    MPH 2.23 2.28    METS 2.24 2.6    RPE 11 13    Perceived Dyspnea  -- 0    VO2 Peak 7.85 9.1    Symptoms No No    Resting HR 72 bpm 90 bpm    Resting BP 124/72 136/72    Resting Oxygen Saturation  99 % 96 %    Exercise Oxygen Saturation  during 6 min walk 95 % 92 %    Max Ex. HR 98 bpm 108 bpm    Max Ex. BP 128/64 154/76    2 Minute Post BP 124/72 --            Nutrition & Weight - Outcomes:  Pre Biometrics - 10/17/22 1138       Pre Biometrics   Height 5' 7.8" (1.722 m)    Weight 207 lb 11.2 oz (94.2 kg)    Waist Circumference 41 inches    Hip Circumference 41 inches    Waist to Hip Ratio 1 %    BMI (Calculated) 31.77    Single Leg Stand 2.9 seconds              Post Biometrics - 01/03/23 1129        Post  Biometrics   Height 5' 7.8" (1.722 m)    Weight 206 lb 11.2 oz (93.8 kg)    Waist Circumference 45.5 inches    Hip Circumference 43 inches    Waist to Hip Ratio 1.06 %    BMI (Calculated) 31.62    Single Leg Stand 3.3 seconds             Nutrition:  Nutrition Therapy & Goals - 10/17/22 1139       Intervention Plan   Intervention Prescribe, educate and counsel regarding individualized specific dietary modifications aiming towards targeted core components such as weight, hypertension, lipid management, diabetes, heart failure and other comorbidities.    Expected Outcomes Short Term Goal: Understand basic principles of dietary content, such as calories, fat, sodium, cholesterol and nutrients.;Short Term Goal: A plan has been developed with personal nutrition goals set during dietitian appointment.;Long Term Goal: Adherence to prescribed nutrition plan.

## 2023-01-03 NOTE — Progress Notes (Signed)
Daily Session Note  Patient Details  Name: Ian Lucas MRN: [id number] Date of Birth: 19-Jun-1950 Referring Provider:   Flowsheet Row Cardiac Rehab from 10/17/2022 in [facility] Cardiac and Pulmonary Rehab  Referring Provider Nelly Laurence MD       Encounter Date: 01/03/2023  Check In:  Session Check In - 01/03/23 1130       Check-In   Supervising physician immediately available to respond to emergencies See telemetry face sheet for immediately available ER MD    Location [facility]-Cardiac & Pulmonary Rehab    Staff Present Rory Percy, MS, Exercise Physiologist;Joseph Reino Kent, RCP,RRT,BSRT;Maxon [place] BS, , Exercise Physiologist;Dela Sweeny Katrinka Blazing, RN, ADN    Virtual Visit No    Medication changes reported     No    Fall or balance concerns reported    No    Warm-up and Cool-down Performed on first and last piece of equipment    Resistance Training Performed Yes    VAD Patient? No    PAD/SET Patient? No      Pain Assessment   Currently in Pain? No/denies                Social History   Tobacco Use  Smoking Status Former   Current packs/day: 0.00   Average packs/day: 0.3 packs/day for 30.1 years (7.5 ttl pk-yrs)   Types: Cigarettes   Start date: 05/08/1968   Quit date: 06/17/1998   Years since quitting: 24.5  Smokeless Tobacco Never    Goals Met:  Independence with exercise equipment Exercise tolerated well No report of concerns or symptoms today Strength training completed today  Goals Unmet:  Not Applicable  Comments: Pt able to follow exercise prescription today without complaint.  Will continue to monitor for progression.   6 Minute Walk     Row Name 10/17/22 1120 01/03/23 1119       6 Minute Walk   Phase Initial Discharge    Distance 1180 feet 1205 feet    Distance % Change -- 2.1 %    Distance Feet Change -- 25 ft    Walk Time 6 minutes 6 minutes    # of Rest Breaks 0 0    MPH 2.23 2.28    METS 2.24 2.6    RPE 11 13    Perceived Dyspnea  -- 0     VO2 Peak 7.85 9.1    Symptoms No No    Resting HR 72 bpm 90 bpm    Resting BP 124/72 136/72    Resting Oxygen Saturation  99 % 96 %    Exercise Oxygen Saturation  during 6 min walk 95 % 92 %    Max Ex. HR 98 bpm 108 bpm    Max Ex. BP 128/64 154/76    2 Minute Post BP 124/72 --               Dr. Bethann Punches is Medical Director for [facility] Cardiac Rehabilitation.  Dr. Vida Rigger is Medical Director for [facility] Pulmonary Rehabilitation.

## 2023-01-05 ENCOUNTER — Other Ambulatory Visit: Payer: Self-pay

## 2023-01-05 ENCOUNTER — Encounter: Payer: No Typology Code available for payment source | Admitting: *Deleted

## 2023-01-05 DIAGNOSIS — I5022 Chronic systolic (congestive) heart failure: Secondary | ICD-10-CM | POA: Diagnosis not present

## 2023-01-05 DIAGNOSIS — M542 Cervicalgia: Secondary | ICD-10-CM

## 2023-01-05 NOTE — Progress Notes (Signed)
Daily Session Note  Patient Details  Name: Ian Lucas MRN: [id number] Date of Birth: 1950-10-22 Referring Provider:   Flowsheet Row Cardiac Rehab from 10/17/2022 in [facility] Cardiac and Pulmonary Rehab  Referring Provider Nelly Laurence MD       Encounter Date: 01/05/2023  Check In:  Session Check In - 01/05/23 1115       Check-In   Supervising physician immediately available to respond to emergencies See telemetry face sheet for immediately available ER MD    Location [facility]-Cardiac & Pulmonary Rehab    Staff Present Cyndia Diver, RN, BSN, Rita Ohara, RN, ADN;Joseph Reino Kent, RCP,RRT,BSRT    Virtual Visit No    Medication changes reported     No    Fall or balance concerns reported    No    Tobacco Cessation No Change    Warm-up and Cool-down Performed on first and last piece of equipment    Resistance Training Performed Yes    VAD Patient? No    PAD/SET Patient? No      Pain Assessment   Currently in Pain? No/denies                Social History   Tobacco Use  Smoking Status Former   Current packs/day: 0.00   Average packs/day: 0.3 packs/day for 30.1 years (7.5 ttl pk-yrs)   Types: Cigarettes   Start date: 05/08/1968   Quit date: 06/17/1998   Years since quitting: 24.5  Smokeless Tobacco Never    Goals Met:  Independence with exercise equipment Exercise tolerated well No report of concerns or symptoms today Strength training completed today  Goals Unmet:  Not Applicable  Comments: Pt able to follow exercise prescription today without complaint.  Will continue to monitor for progression.    Dr. Bethann Punches is Medical Director for [facility] Cardiac Rehabilitation.  Dr. Vida Rigger is Medical Director for [facility] Pulmonary Rehabilitation.

## 2023-01-10 ENCOUNTER — Encounter: Payer: Self-pay | Admitting: *Deleted

## 2023-01-10 ENCOUNTER — Encounter: Payer: No Typology Code available for payment source | Admitting: *Deleted

## 2023-01-10 DIAGNOSIS — Z5189 Encounter for other specified aftercare: Secondary | ICD-10-CM | POA: Insufficient documentation

## 2023-01-10 DIAGNOSIS — I5022 Chronic systolic (congestive) heart failure: Secondary | ICD-10-CM

## 2023-01-10 NOTE — Progress Notes (Signed)
Cardiac Individual Treatment Plan  Patient Details  Name: Ian Lucas MRN: [id number] Date of Birth: 07-Dec-1950 Referring Provider:   Flowsheet Row Cardiac Rehab from 10/17/2022 in [facility] Cardiac and Pulmonary Rehab  Referring Provider Nelly Laurence MD       Initial Encounter Date:  Flowsheet Row Cardiac Rehab from 10/17/2022 in [facility] Cardiac and Pulmonary Rehab  Date 10/17/22       Visit Diagnosis: Heart failure, chronic systolic (HCC)  Patient's Home Medications on Admission:  Current Outpatient Medications:    albuterol (VENTOLIN HFA) 108 (90 Base) MCG/ACT inhaler, Inhale 2 puffs into the lungs every 6 (six) hours as needed for wheezing or shortness of breath., Disp: 6.7 g, Rfl: 0   Alogliptin Benzoate 25 MG TABS, Take 25 mg by mouth daily., Disp: , Rfl:    amLODipine (NORVASC) 10 MG tablet, Take 10 mg by mouth daily. , Disp: , Rfl:    carbamide peroxide (DEBROX) 6.5 % OTIC solution, Place in ear(s)., Disp: , Rfl:    carvedilol (COREG) 12.5 MG tablet, Take 1 tablet (12.5 mg total) by mouth 2 (two) times daily with a meal. (Patient not taking: Reported on 10/11/2022), Disp: 60 tablet, Rfl: 1   carvedilol (COREG) 25 MG tablet, Take by mouth., Disp: , Rfl:    Cholecalciferol 50 MCG (2000 UT) TABS, Take by mouth., Disp: , Rfl:    donepezil (ARICEPT) 10 MG tablet, Take 5 mg by mouth in the morning. (Patient not taking: Reported on 10/11/2022), Disp: , Rfl:    DULoxetine (CYMBALTA) 60 MG capsule, Take 60 mg by mouth daily. , Disp: , Rfl:    EPINEPHrine 0.3 mg/0.3 mL IJ SOAJ injection, Inject 0.3 mg into the muscle as needed for anaphylaxis., Disp: 1 each, Rfl: 1   ergocalciferol (VITAMIN D2) 1.25 MG (50000 UT) capsule, Take 50,000 Units by mouth once a week., Disp: , Rfl:    famotidine (PEPCID) 20 MG tablet, Take 20 mg by mouth daily., Disp: , Rfl:    fluticasone-salmeterol (ADVAIR HFA) 115-21 MCG/ACT inhaler, Inhale 2 puffs into the lungs 2 (two) times daily., Disp: 1 each, Rfl: 12    hydrOXYzine (ATARAX) 10 MG tablet, Take 1 tablet (10 mg total) by mouth 3 (three) times daily as needed for anxiety., Disp: 30 tablet, Rfl: 0   ibuprofen (ADVIL,MOTRIN) 600 MG tablet, Take 1 tablet (600 mg total) by mouth every 6 (six) hours as needed. (Patient not taking: Reported on 10/11/2022), Disp: 30 tablet, Rfl: 0   ipratropium-albuterol (DUONEB) 0.5-2.5 (3) MG/3ML SOLN, Inhale into the lungs., Disp: , Rfl:    irbesartan (AVAPRO) 150 MG tablet, Take 1 tablet (150 mg total) by mouth daily. (Patient not taking: Reported on 10/11/2022), Disp: 30 tablet, Rfl: 1   irbesartan (AVAPRO) 300 MG tablet, Take by mouth., Disp: , Rfl:    Melatonin-Pyridoxine (MELATONIN-B6 PO), Take 1 tablet by mouth at bedtime., Disp: , Rfl:    meloxicam (MOBIC) 15 MG tablet, Take by mouth., Disp: , Rfl:    memantine (NAMENDA) 10 MG tablet, Take 10 mg by mouth daily. (Patient not taking: Reported on 10/11/2022), Disp: , Rfl:    memantine (NAMENDA) 10 MG tablet, Take 1 tablet by mouth every morning., Disp: , Rfl:    metFORMIN (GLUCOPHAGE) 500 MG tablet, Take 500 mg by mouth daily with breakfast., Disp: , Rfl:    polyethylene glycol (MIRALAX / GLYCOLAX) 17 g packet, Take 17 g by mouth daily as needed., Disp: 14 each, Rfl: 0   potassium chloride (KLOR-CON)  20 MEQ packet, Take 40 mEq by mouth daily for 7 days., Disp: 14 packet, Rfl: 0   pregabalin (LYRICA) 150 MG capsule, Take 1 capsule by mouth at bedtime., Disp: , Rfl:    pregabalin (LYRICA) 300 MG capsule, Take 200 mg by mouth 2 (two) times daily. (Patient not taking: Reported on 10/11/2022), Disp: , Rfl:    pyridOXINE (B-6) 50 MG tablet, Take by mouth., Disp: , Rfl:    rosuvastatin (CRESTOR) 10 MG tablet, Take 10 mg by mouth daily., Disp: , Rfl:    Semaglutide,0.25 or 0.5MG /DOS, 2 MG/3ML SOPN, Inject into the skin. (Patient not taking: Reported on 10/11/2022), Disp: , Rfl:    senna-docusate (SENOKOT-S) 8.6-50 MG tablet, Take by mouth., Disp: , Rfl:    Spacer/Aero-Holding Chambers  (AEROCHAMBER MV) inhaler, Use as instructed, Disp: 1 each, Rfl: 2   spironolactone (ALDACTONE) 25 MG tablet, Take by mouth., Disp: , Rfl:    tamsulosin (FLOMAX) 0.4 MG CAPS capsule, Take 0.4 mg by mouth 2 (two) times a day. , Disp: , Rfl:    tiotropium (SPIRIVA) 18 MCG inhalation capsule, Place 18 mcg into inhaler and inhale daily., Disp: , Rfl:    traZODone (DESYREL) 50 MG tablet, Take 1 tablet (50 mg total) by mouth at bedtime., Disp: 30 tablet, Rfl: 0  Past Medical History: Past Medical History:  Diagnosis Date   AKI (acute kidney injury) (HCC) 07/26/2022   Arthritis    Rheumatoid   Complication of anesthesia    has become combative following surgery   Diabetes mellitus without complication (HCC)    Type 2   History of kidney stones    x 2   History of shingles    Hypercholesteremia    Hypertension    Neuropathy    Pancreatitis    PTSD (post-traumatic stress disorder)    Sleep apnea    CPAP machine   Vertigo    occasional    Tobacco Use: Social History   Tobacco Use  Smoking Status Former   Current packs/day: 0.00   Average packs/day: 0.3 packs/day for 30.1 years (7.5 ttl pk-yrs)   Types: Cigarettes   Start date: 05/08/1968   Quit date: 06/17/1998   Years since quitting: 24.5  Smokeless Tobacco Never    Labs: Review Flowsheet  More data exists      Latest Ref Rng & Units 03/14/2016 08/21/2018 11/15/2018 07/22/2022 07/26/2022  Labs for ITP Cardiac and Pulmonary Rehab  Cholestrol 0 - 200 mg/dL - 161     096  - -  LDL (calc) 0 - 99 mg/dL - 045     409  - -  HDL-C >40 mg/dL - 67     62  - -  Trlycerides <150 mg/dL - 811     57  - -  Hemoglobin A1c 4.8 - 5.6 % 6.8  6.7     6.0  - 8.7   Bicarbonate 20.0 - 28.0 mmol/L - - - 27.9  -  O2 Saturation % - - - 58.5  -    Details       This result is from an external source.          Exercise Target Goals: Exercise Program Goal: Individual exercise prescription set using results from initial 6 min walk test and THRR  while considering  patient's activity barriers and safety.   Exercise Prescription Goal: Initial exercise prescription builds to 30-45 minutes a day of aerobic activity, 2-3 days per week.  Home exercise guidelines  will be given to patient during program as part of exercise prescription that the participant will acknowledge.   Education: Aerobic Exercise: - Group verbal and visual presentation on the components of exercise prescription. Introduces F.I.T.T principle from ACSM for exercise prescriptions.  Reviews F.I.T.T. principles of aerobic exercise including progression. Written material given at graduation. Flowsheet Row Cardiac Rehab from 12/13/2022 in [facility] Cardiac and Pulmonary Rehab  Education need identified 10/17/22       Education: Resistance Exercise: - Group verbal and visual presentation on the components of exercise prescription. Introduces F.I.T.T principle from ACSM for exercise prescriptions  Reviews F.I.T.T. principles of resistance exercise including progression. Written material given at graduation. Flowsheet Row Cardiac Rehab from 12/13/2022 in [facility] Cardiac and Pulmonary Rehab  Date 10/25/22  Educator NT  Instruction Review Code 1- Verbalizes Understanding        Education: Exercise & Equipment Safety: - Individual verbal instruction and demonstration of equipment use and safety with use of the equipment. Flowsheet Row Cardiac Rehab from 12/13/2022 in [facility] Cardiac and Pulmonary Rehab  Date 10/17/22  Educator [facility]  Instruction Review Code 1- Verbalizes Understanding       Education: Exercise Physiology & General Exercise Guidelines: - Group verbal and written instruction with models to review the exercise physiology of the cardiovascular system and associated critical values. Provides general exercise guidelines with specific guidelines to those with heart or lung disease.  Flowsheet Row Cardiac Rehab from 12/13/2022 in [facility] Cardiac and Pulmonary Rehab  Date 12/13/22   Educator SB  Instruction Review Code 1- [employer] Understanding       Education: Flexibility, Balance, Mind/Body Relaxation: - Group verbal and visual presentation with interactive activity on the components of exercise prescription. Introduces F.I.T.T principle from ACSM for exercise prescriptions. Reviews F.I.T.T. principles of flexibility and balance exercise training including progression. Also discusses the mind body connection.  Reviews various relaxation techniques to help reduce and manage stress (i.e. Deep breathing, progressive muscle relaxation, and visualization). Balance handout provided to take home. Written material given at graduation. Flowsheet Row Cardiac Rehab from 12/13/2022 in [facility] Cardiac and Pulmonary Rehab  Date 10/25/22  Educator NT  Instruction Review Code 1- Verbalizes Understanding       Activity Barriers & Risk Stratification:  Activity Barriers & Cardiac Risk Stratification - 10/17/22 1120       Activity Barriers & Cardiac Risk Stratification   Activity Barriers Deconditioning;Muscular Weakness;Back Problems;Balance Concerns;Assistive Device;Other (comment)    Comments R hand cold and numbness, back pain with standing    Cardiac Risk Stratification High             6 Minute Walk:  6 Minute Walk     Row Name 10/17/22 1120 01/03/23 1119       6 Minute Walk   Phase Initial Discharge    Distance 1180 feet 1205 feet    Distance % Change -- 2.1 %    Distance Feet Change -- 25 ft    Walk Time 6 minutes 6 minutes    # of Rest Breaks 0 0    MPH 2.23 2.28    METS 2.24 2.6    RPE 11 13    Perceived Dyspnea  -- 0    VO2 Peak 7.85 9.1    Symptoms No No    Resting HR 72 bpm 90 bpm    Resting BP 124/72 136/72    Resting Oxygen Saturation  99 % 96 %    Exercise Oxygen Saturation  during 6  min walk 95 % 92 %    Max Ex. HR 98 bpm 108 bpm    Max Ex. BP 128/64 154/76    2 Minute Post BP 124/72 --             Oxygen Initial  Assessment:   Oxygen Re-Evaluation:   Oxygen Discharge (Final Oxygen Re-Evaluation):   Initial Exercise Prescription:  Initial Exercise Prescription - 10/17/22 1100       Date of Initial Exercise RX and Referring Provider   Date 10/17/22    Referring Provider Nelly Laurence MD      Oxygen   Maintain Oxygen Saturation 88% or higher      Treadmill   MPH 2    Grade 0.5    Minutes 15    METs 2.67      Recumbant Bike   Level 1    RPM 50    Watts 7    Minutes 15    METs 2.5      NuStep   Level 1    SPM 80    Minutes 15    METs 2.5      REL-XR   Level 1    Speed 50    Minutes 15    METs 2.5      Track   Laps 30    Minutes 15    METs 2.63      Prescription Details   Frequency (times per week) 3    Duration Progress to 30 minutes of continuous aerobic without signs/symptoms of physical distress      Intensity   THRR 40-80% of Max Heartrate 102-132    Ratings of Perceived Exertion 11-13    Perceived Dyspnea 0-4      Progression   Progression Continue to progress workloads to maintain intensity without signs/symptoms of physical distress.      Resistance Training   Training Prescription Yes    Weight 3 lb             Perform Capillary Blood Glucose checks as needed.  Exercise Prescription Changes:   Exercise Prescription Changes     Row Name 10/17/22 1100 11/01/22 0800 11/15/22 1600 11/29/22 0700 12/13/22 1400     Response to Exercise   Blood Pressure (Admit) 124/72 122/68 112/64 132/80 122/78   Blood Pressure (Exercise) 128/64 148/78 148/80 136/66 162/82   Blood Pressure (Exit) 124/72 122/70 122/64 118/64 124/74   Heart Rate (Admit) 72 bpm 79 bpm 81 bpm 88 bpm 88 bpm   Heart Rate (Exercise) 98 bpm 99 bpm 97 bpm 93 bpm 120 bpm   Heart Rate (Exit) 79 bpm 73 bpm 75 bpm 85 bpm 94 bpm   Oxygen Saturation (Admit) 99 % -- -- -- --   Oxygen Saturation (Exercise) 95 % -- -- -- --   Rating of Perceived Exertion (Exercise) 11 13 13 13 13    Symptoms  none none -- -- none   Comments walk test results First two weeks of exercise -- -- --   Duration -- Continue with 30 min of aerobic exercise without signs/symptoms of physical distress. Continue with 30 min of aerobic exercise without signs/symptoms of physical distress. Continue with 30 min of aerobic exercise without signs/symptoms of physical distress. Continue with 30 min of aerobic exercise without signs/symptoms of physical distress.   Intensity -- THRR unchanged THRR unchanged THRR unchanged THRR unchanged     Progression   Progression -- Continue to progress workloads to maintain intensity without signs/symptoms  of physical distress. Continue to progress workloads to maintain intensity without signs/symptoms of physical distress. Continue to progress workloads to maintain intensity without signs/symptoms of physical distress. Continue to progress workloads to maintain intensity without signs/symptoms of physical distress.   Average METs -- 1.78 1.74 2.02 2.5     Resistance Training   Training Prescription -- Yes Yes Yes Yes   Weight -- 3 lb 0 lb 0 lb 3   Reps -- 10-15 10-15 10-15 10-15     Interval Training   Interval Training -- No No No No     Treadmill   MPH -- 1.2 -- -- --   Grade -- 0 -- -- --   Minutes -- 15 -- -- --   METs -- 1.92 -- -- --     Recumbant Bike   Level -- 2 -- -- 3   RPM -- -- -- -- 3   Watts -- -- -- -- --   Minutes -- 15 -- -- 15   METs -- 2.1 -- -- 2.1     NuStep   Level -- 5 5 3  --   Minutes -- 15 15 15  --   METs -- 1 1.5 2.7 --     REL-XR   Level -- 1 1 1 2    Minutes -- 15 15 15 15    METs -- 2.1 2 2  2.1     Track   Laps -- 14 14 -- 40   Minutes -- 15 15 -- 15   METs -- 1.76 1.76 -- 3.18     Oxygen   Maintain Oxygen Saturation -- 88% or higher 88% or higher 88% or higher 88% or higher    Row Name 12/25/22 1100 12/26/22 1400 01/09/23 1500         Response to Exercise   Blood Pressure (Admit) -- 124/70 134/68     Blood Pressure  (Exit) -- 140/72 118/64     Heart Rate (Admit) -- 81 bpm 84 bpm     Heart Rate (Exercise) -- 96 bpm 103 bpm     Heart Rate (Exit) -- 88 bpm 87 bpm     Oxygen Saturation (Admit) -- 97 % 97 %     Oxygen Saturation (Exercise) -- 89 % 91 %     Oxygen Saturation (Exit) -- 96 % 97 %     Rating of Perceived Exertion (Exercise) -- 15 13     Perceived Dyspnea (Exercise) -- -- 0     Symptoms -- none none     Duration Continue with 30 min of aerobic exercise without signs/symptoms of physical distress. Continue with 30 min of aerobic exercise without signs/symptoms of physical distress. Continue with 30 min of aerobic exercise without signs/symptoms of physical distress.     Intensity THRR unchanged THRR unchanged THRR unchanged       Progression   Progression Continue to progress workloads to maintain intensity without signs/symptoms of physical distress. Continue to progress workloads to maintain intensity without signs/symptoms of physical distress. Continue to progress workloads to maintain intensity without signs/symptoms of physical distress.     Average METs 2.5 2.2 2.15       Resistance Training   Training Prescription Yes Yes Yes     Weight 3 3 3      Reps 10-15 10-15 10-15       Interval Training   Interval Training No No No       Treadmill   MPH -- 1.7 --  Grade -- 0 --     Minutes -- 15 --     METs -- 2.3 --       Recumbant Bike   Level 3 -- --     RPM 3 -- --     Minutes 15 -- --     METs 2.1 -- --       NuStep   Level -- 2  T6 2     Minutes -- 15 15     METs -- 1.8  T6 1.8       REL-XR   Level 2 2 1      Minutes 15 15 15      METs 2.1 2.4 --       T5 Nustep   Level -- -- 2  T6     Minutes -- -- 15     METs -- -- 2.3       Track   Laps 40 30 30     Minutes 15 15 15      METs 3.18 2.63 2.63       Home Exercise Plan   Plans to continue exercise at Home (comment)  walk at home Home (comment)  walk at home Home (comment)  walk at home     Frequency Add 2  additional days to program exercise sessions. Add 2 additional days to program exercise sessions. Add 2 additional days to program exercise sessions.     Initial Home Exercises Provided 12/25/22 12/25/22 12/25/22       Oxygen   Maintain Oxygen Saturation 88% or higher 88% or higher 88% or higher              Exercise Comments:   Exercise Comments     Row Name 10/18/22 1037           Exercise Comments First full day of exercise!  Patient was oriented to gym and equipment including functions, settings, policies, and procedures.  Patient's individual exercise prescription and treatment plan were reviewed.  All starting workloads were established based on the results of the 6 minute walk test done at initial orientation visit.  The plan for exercise progression was also introduced and progression will be customized based on patient's performance and goals.                Exercise Goals and Review:   Exercise Goals     Row Name 10/17/22 1137             Exercise Goals   Increase Physical Activity Yes       Intervention Provide advice, education, support and counseling about physical activity/exercise needs.;Develop an individualized exercise prescription for aerobic and resistive training based on initial evaluation findings, risk stratification, comorbidities and participant's personal goals.       Expected Outcomes Short Term: Attend rehab on a regular basis to increase amount of physical activity.;Long Term: Add in home exercise to make exercise part of routine and to increase amount of physical activity.;Long Term: Exercising regularly at least 3-5 days a week.       Increase Strength and Stamina Yes       Intervention Provide advice, education, support and counseling about physical activity/exercise needs.;Develop an individualized exercise prescription for aerobic and resistive training based on initial evaluation findings, risk stratification, comorbidities and  participant's personal goals.       Expected Outcomes Short Term: Increase workloads from initial exercise prescription for resistance, speed, and METs.;Short Term: Perform resistance training exercises  routinely during rehab and add in resistance training at home;Long Term: Improve cardiorespiratory fitness, muscular endurance and strength as measured by increased METs and functional capacity ( )       Able to understand and use rate of perceived exertion (RPE) scale Yes       Intervention Provide education and explanation on how to use RPE scale       Expected Outcomes Long Term:  Able to use RPE to guide intensity level when exercising independently;Short Term: Able to use RPE daily in rehab to express subjective intensity level       Able to understand and use Dyspnea scale Yes       Intervention Provide education and explanation on how to use Dyspnea scale       Expected Outcomes Short Term: Able to use Dyspnea scale daily in rehab to express subjective sense of shortness of breath during exertion;Long Term: Able to use Dyspnea scale to guide intensity level when exercising independently       Knowledge and understanding of Target Heart Rate Range (THRR) Yes       Intervention Provide education and explanation of THRR including how the numbers were predicted and where they are located for reference       Expected Outcomes Short Term: Able to state/look up THRR;Short Term: Able to use daily as guideline for intensity in rehab;Long Term: Able to use THRR to govern intensity when exercising independently       Able to check pulse independently Yes       Intervention Provide education and demonstration on how to check pulse in carotid and radial arteries.;Review the importance of being able to check your own pulse for safety during independent exercise       Expected Outcomes Short Term: Able to explain why pulse checking is important during independent exercise;Long Term: Able to check pulse  independently and accurately       Understanding of Exercise Prescription Yes       Intervention Provide education, explanation, and written materials on patient's individual exercise prescription       Expected Outcomes Short Term: Able to explain program exercise prescription;Long Term: Able to explain home exercise prescription to exercise independently                Exercise Goals Re-Evaluation :  Exercise Goals Re-Evaluation     Row Name 10/18/22 1038 11/01/22 0858 11/15/22 1631 11/29/22 0747 12/13/22 1422     Exercise Goal Re-Evaluation   Exercise Goals Review Increase Physical Activity;Able to understand and use rate of perceived exertion (RPE) scale;Knowledge and understanding of Target Heart Rate Range (THRR);Understanding of Exercise Prescription;Increase Strength and Stamina;Able to check pulse independently Increase Physical Activity;Increase Strength and Stamina;Understanding of Exercise Prescription Increase Physical Activity;Increase Strength and Stamina;Understanding of Exercise Prescription Understanding of Exercise Prescription Increase Physical Activity;Increase Strength and Stamina;Understanding of Exercise Prescription   Comments Reviewed RPE  and dyspnea scale, THR and program prescription with pt today.  Pt voiced understanding and was given a copy of goals to take home. Ron is off to a good start in the program. He was able to walk up to 1.2 mph with no incline on the treadmill. He also improved to level 5 on the T4 nustep and level 2 on the recumbent bike. We will continue to monitor his progress in the program. Ron continues to exercise at same loads as last review. He is not using weight during resistive work. Will encourage him to increase some  wrokloads at tolerated with goals of increasing strength and stamina.  Will continue to monitor exercise progression Ron continues to work oat or less levels than last review. HIs RPE is at 13. He continues to do ROM during  resistive work.  WIll encourage him to increase at leaset one workload.  Will continue to monitor exercise progression. Ron continues to do well in rehab. He was able to increase his hand weights from 0 to 3lbs. He also increased his track laps to 40. We will continue to monitor his progress in the program.   Expected Outcomes Short: Use RPE daily to regulate intensity.  Long: Follow program prescription in THR. Short: Continue to follow initial exercise prescription. Long: Continue to improve strength and stamina. STG Continue with exercise progression as tolerate with goal of increased strength and stamina.  LTG Continued exercise progression after discharge YQM:VHQIONGE workloads on at least one piece of equipment, as tolerated. LTG: Continued exercise progression as tolerated during program and after discharge. Short: Continue to follow current exercise prescription, and progressively increase workloads. Long: Continue exercise to improve strength and stamina.    Row Name 12/25/22 1125 12/26/22 1418 01/09/23 1549         Exercise Goal Re-Evaluation   Exercise Goals Review Increase Physical Activity;Increase Strength and Stamina;Able to understand and use rate of perceived exertion (RPE) scale;Able to understand and use Dyspnea scale;Knowledge and understanding of Target Heart Rate Range (THRR);Able to check pulse independently;Understanding of Exercise Prescription Increase Physical Activity;Understanding of Exercise Prescription;Increase Strength and Stamina Increase Physical Activity;Understanding of Exercise Prescription;Increase Strength and Stamina     Comments Reviewed home exercise with pt today.  Pt plans to walk at home 1-2 days a week on non-class days and possibly join the [employer] for exercise.  Reviewed THR, pulse, RPE, sign and symptoms, pulse oximetery and when to call 911 or MD.  Also discussed weather considerations and indoor options.  Pt voiced understanding. Ron continues to do well  in rehab. He has stayed consistent with 3lb weights for resistance training. He also is due for his post 6 minute walk test soon. We will continue to monitor his progress in the program. Ron continues to do well in rehab.  he recently completed his post-6MWT and improved by 10ft. He also has implemented the T6 nustep into his prescription at level 2. We will continue to monitor his progress in the program.     Expected Outcomes Short: add 1-2 days a week of exercise at home on off days of rehab. Long: continue to improve strength and stamina and become independent with exercise program. Short: Improve on post and try increasing hand weights to 4lbs. Long: Continue to increase overall METs and stamina. Short: Graduate. Long: Continue to exercise independently.              Discharge Exercise Prescription (Final Exercise Prescription Changes):  Exercise Prescription Changes - 01/09/23 1500       Response to Exercise   Blood Pressure (Admit) 134/68    Blood Pressure (Exit) 118/64    Heart Rate (Admit) 84 bpm    Heart Rate (Exercise) 103 bpm    Heart Rate (Exit) 87 bpm    Oxygen Saturation (Admit) 97 %    Oxygen Saturation (Exercise) 91 %    Oxygen Saturation (Exit) 97 %    Rating of Perceived Exertion (Exercise) 13    Perceived Dyspnea (Exercise) 0    Symptoms none    Duration Continue with 30 min  of aerobic exercise without signs/symptoms of physical distress.    Intensity THRR unchanged      Progression   Progression Continue to progress workloads to maintain intensity without signs/symptoms of physical distress.    Average METs 2.15      Resistance Training   Training Prescription Yes    Weight 3    Reps 10-15      Interval Training   Interval Training No      NuStep   Level 2    Minutes 15    METs 1.8      REL-XR   Level 1    Minutes 15      T5 Nustep   Level 2   T6   Minutes 15    METs 2.3      Track   Laps 30    Minutes 15    METs 2.63      Home  Exercise Plan   Plans to continue exercise at Home (comment)   walk at home   Frequency Add 2 additional days to program exercise sessions.    Initial Home Exercises Provided 12/25/22      Oxygen   Maintain Oxygen Saturation 88% or higher             Nutrition:  Target Goals: Understanding of nutrition guidelines, daily intake of sodium 1500mg , cholesterol 200mg , calories 30% from fat and 7% or less from saturated fats, daily to have 5 or more servings of fruits and vegetables.  Education: All About Nutrition: -Group instruction provided by verbal, written material, interactive activities, discussions, models, and posters to present general guidelines for heart healthy nutrition including fat, fiber, MyPlate, the role of sodium in heart healthy nutrition, utilization of the nutrition label, and utilization of this knowledge for meal planning. Follow up email sent as well. Written material given at graduation. Flowsheet Row Cardiac Rehab from 12/13/2022 in [facility] Cardiac and Pulmonary Rehab  Education need identified 10/17/22       Biometrics:  Pre Biometrics - 10/17/22 1138       Pre Biometrics   Height 5' 7.8" (1.722 m)    Weight 207 lb 11.2 oz (94.2 kg)    Waist Circumference 41 inches    Hip Circumference 41 inches    Waist to Hip Ratio 1 %    BMI (Calculated) 31.77    Single Leg Stand 2.9 seconds             Post Biometrics - 01/03/23 1129        Post  Biometrics   Height 5' 7.8" (1.722 m)    Weight 206 lb 11.2 oz (93.8 kg)    Waist Circumference 45.5 inches    Hip Circumference 43 inches    Waist to Hip Ratio 1.06 %    BMI (Calculated) 31.62    Single Leg Stand 3.3 seconds             Nutrition Therapy Plan and Nutrition Goals:  Nutrition Therapy & Goals - 10/17/22 1139       Intervention Plan   Intervention Prescribe, educate and counsel regarding individualized specific dietary modifications aiming towards targeted core components such as weight,  hypertension, lipid management, diabetes, heart failure and other comorbidities.    Expected Outcomes Short Term Goal: Understand basic principles of dietary content, such as calories, fat, sodium, cholesterol and nutrients.;Short Term Goal: A plan has been developed with personal nutrition goals set during dietitian appointment.;Long Term Goal: Adherence to prescribed nutrition  plan.             Nutrition Assessments:  MEDIFICTS Score Key: ?70 Need to make dietary changes  40-70 Heart Healthy Diet ? 40 Therapeutic Level Cholesterol Diet  Flowsheet Row Cardiac Rehab from 01/05/2023 in [facility] Cardiac and Pulmonary Rehab  Picture Your Plate Total Score on Discharge 55      Picture Your Plate Scores: <75 Unhealthy dietary pattern with much room for improvement. 41-50 Dietary pattern unlikely to meet recommendations for good health and room for improvement. 51-60 More healthful dietary pattern, with some room for improvement.  >60 Healthy dietary pattern, although there may be some specific behaviors that could be improved.    Nutrition Goals Re-Evaluation:  Nutrition Goals Re-Evaluation     Row Name 11/06/22 1146 11/27/22 1126 01/03/23 1143         Goals   Comment Patient scheduled an appointment today to meet with program RD. His appointment is 11/20/2022. Patient did not attend his dietitian appintment Ron has missed his RD appointment a few times but expressed that he would still like to meet with the RD before he graduates. We will set up an appointment with the RD.     Expected Outcome Short: meet with RD to set specific nutrition goals. Long: adapt to a heart healhty diet to help control cardiac risk factors. -- Short: meet with RD to set specific nutrition goals. Long: adapt to a heart healhty diet to help control cardiac risk factors.              Nutrition Goals Discharge (Final Nutrition Goals Re-Evaluation):  Nutrition Goals Re-Evaluation - 01/03/23 1143        Goals   Comment Ron has missed his RD appointment a few times but expressed that he would still like to meet with the RD before he graduates. We will set up an appointment with the RD.    Expected Outcome Short: meet with RD to set specific nutrition goals. Long: adapt to a heart healhty diet to help control cardiac risk factors.             Psychosocial: Target Goals: Acknowledge presence or absence of significant depression and/or stress, maximize coping skills, provide positive support system. Participant is able to verbalize types and ability to use techniques and skills needed for reducing stress and depression.   Education: Stress, Anxiety, and Depression - Group verbal and visual presentation to define topics covered.  Reviews how body is impacted by stress, anxiety, and depression.  Also discusses healthy ways to reduce stress and to treat/manage anxiety and depression.  Written material given at graduation. Flowsheet Row Cardiac Rehab from 12/13/2022 in [facility] Cardiac and Pulmonary Rehab  Date 12/06/22  Educator SB  Instruction Review Code 1- [employer] Understanding       Education: Sleep Hygiene -Provides group verbal and written instruction about how sleep can affect your health.  Define sleep hygiene, discuss sleep cycles and impact of sleep habits. Review good sleep hygiene tips.    Initial Review & Psychosocial Screening:  Initial Psych Review & Screening - 10/11/22 1344       Initial Review   Current issues with Current Anxiety/Panic;Current Psychotropic Meds;Current Depression;History of Depression      Family Dynamics   Good Support System? Yes    Comments He can look to his home nurse for support. His wife is a good supporty system and his son comes by three times a week.      Barriers  Psychosocial barriers to participate in program The patient should benefit from training in stress management and relaxation.;There are no identifiable barriers or psychosocial  needs.      Screening Interventions   Interventions To provide support and resources with identified psychosocial needs;Provide feedback about the scores to participant    Expected Outcomes Short Term goal: Utilizing psychosocial counselor, staff and physician to assist with identification of specific Stressors or current issues interfering with healing process. Setting desired goal for each stressor or current issue identified.;Long Term Goal: Stressors or current issues are controlled or eliminated.;Short Term goal: Identification and review with participant of any Quality of Life or Depression concerns found by scoring the questionnaire.;Long Term goal: The participant improves quality of Life and PHQ9 Scores as seen by post scores and/or verbalization of changes             Quality of Life Scores:   Quality of Life - 01/05/23 1135       Quality of Life Scores   Health/Function Pre 19.67 %    Health/Function Post 26.14 %    Health/Function % Change 32.89 %    Socioeconomic Pre 26.4 %    Socioeconomic Post 30 %    Socioeconomic % Change  13.64 %    Psych/Spiritual Pre 25.71 %    Psych/Spiritual Post 25.57 %    Psych/Spiritual % Change -0.54 %    Family Pre 28.8 %    Family Post 28.5 %    Family % Change -1.04 %    GLOBAL Pre 23.47 %    GLOBAL Post 26.86 %    GLOBAL % Change 14.44 %            Scores of 19 and below usually indicate a poorer quality of life in these areas.  A difference of  2-3 points is a clinically meaningful difference.  A difference of 2-3 points in the total score of the Quality of Life Index has been associated with significant improvement in overall quality of life, self-image, physical symptoms, and general health in studies assessing change in quality of life.  PHQ-9: Review Flowsheet  More data exists      01/05/2023 11/27/2022 11/06/2022 10/17/2022 11/25/2018  Depression screen PHQ 2/9  Decreased Interest 1 1 1  0 0  Down, Depressed, Hopeless 0 0 2  1 0  PHQ - 2 Score 1 1 3 1  0  Altered sleeping 0 0 2 0 0  Tired, decreased energy 1 0 2 1 0  Change in appetite 1 0 2 0 0  Feeling bad or failure about yourself  0 0 0 1 0  Trouble concentrating 1 1 0 1 0  Moving slowly or fidgety/restless 1 0 0 2 0  Suicidal thoughts 0 0 0 0 0  PHQ-9 Score 5 2 9 6  0  Difficult doing work/chores Not difficult at all Not difficult at all Not difficult at all Somewhat difficult Not difficult at all    Details           Interpretation of Total Score  Total Score Depression Severity:  1-4 = Minimal depression, 5-9 = Mild depression, 10-14 = Moderate depression, 15-19 = Moderately severe depression, 20-27 = Severe depression   Psychosocial Evaluation and Intervention:  Psychosocial Evaluation - 10/11/22 1346       Psychosocial Evaluation & Interventions   Interventions Encouraged to exercise with the program and follow exercise prescription;Relaxation education;Stress management education    Comments He can look to his home  nurse for support. His wife is a good supporty system and his son comes by three times a week.    Expected Outcomes Short: Start HeartTrack to help with mood. Long: Maintain a healthy mental state    Continue Psychosocial Services  Follow up required by staff             Psychosocial Re-Evaluation:  Psychosocial Re-Evaluation     Row Name 11/06/22 1149 11/27/22 1131 01/03/23 1139         Psychosocial Re-Evaluation   Current issues with Current Psychotropic Meds;Current Depression Current Psychotropic Meds;Current Depression None Identified;Current Sleep Concerns     Comments Patient reports that he has been on mood medications for a long time and that he follow up with his doctor if new concerns arise. He currently takes all his meds a presrcibed and feels like he is maintaining his mental health. Reviewed patient health questionnaire (PHQ-9) with patient for follow up. Previously, patients score indicated signs/symptoms  of depression.  Reviewed to see if patient is improving symptom wise while in program.  Score improved and patient states that it is because he is able to come to rehab and enjoys it. Ron reports no current stressors at this time. He is sleeping well since adjusting to using his CPAP machine. He also states that his wife, son and daughter make up a good support system for him. He also states that yardwork and exercise have been a good stress releiver for him.     Expected Outcomes Short: continue to take mood medications and follow up with doctor if any changes or concerns arise. Long: maintain good mental health habits to help control a stable mood. Short: Continue to attend [facility] regularly for regular exercise and social engagement. Long: Continue to improve symptoms and manage a positive mental state. Short: Continue to exercise for mental boost. Long: Continue to maintain positive outlook.     Interventions Encouraged to attend Cardiac Rehabilitation for the exercise Encouraged to attend Cardiac Rehabilitation for the exercise Encouraged to attend Cardiac Rehabilitation for the exercise     Continue Psychosocial Services  Follow up required by staff Follow up required by staff Follow up required by staff              Psychosocial Discharge (Final Psychosocial Re-Evaluation):  Psychosocial Re-Evaluation - 01/03/23 1139       Psychosocial Re-Evaluation   Current issues with None Identified;Current Sleep Concerns    Comments Ron reports no current stressors at this time. He is sleeping well since adjusting to using his CPAP machine. He also states that his wife, son and daughter make up a good support system for him. He also states that yardwork and exercise have been a good stress releiver for him.    Expected Outcomes Short: Continue to exercise for mental boost. Long: Continue to maintain positive outlook.    Interventions Encouraged to attend Cardiac Rehabilitation for the exercise     Continue Psychosocial Services  Follow up required by staff             Vocational Rehabilitation: Provide vocational rehab assistance to qualifying candidates.   Vocational Rehab Evaluation & Intervention:   Education: Education Goals: Education classes will be provided on a variety of topics geared toward better understanding of heart health and risk factor modification. Participant will state understanding/return demonstration of topics presented as noted by education test scores.  Learning Barriers/Preferences:  Learning Barriers/Preferences - 10/11/22 1343       Learning  Barriers/Preferences   Learning Barriers None    Learning Preferences None             General Cardiac Education Topics:  AED/CPR: - Group verbal and written instruction with the use of models to demonstrate the basic use of the AED with the basic ABC's of resuscitation.   Anatomy and Cardiac Procedures: - Group verbal and visual presentation and models provide information about basic cardiac anatomy and function. Reviews the testing methods done to diagnose heart disease and the outcomes of the test results. Describes the treatment choices: Medical Management, Angioplasty, or Coronary Bypass Surgery for treating various heart conditions including Myocardial Infarction, Angina, Valve Disease, and Cardiac Arrhythmias.  Written material given at graduation. Flowsheet Row Cardiac Rehab from 12/13/2022 in [facility] Cardiac and Pulmonary Rehab  Education need identified 10/17/22  Date 11/08/22  Educator SB  Instruction Review Code 1- Verbalizes Understanding       Medication Safety: - Group verbal and visual instruction to review commonly prescribed medications for heart and lung disease. Reviews the medication, class of the drug, and side effects. Includes the steps to properly store meds and maintain the prescription regimen.  Written material given at graduation.   Intimacy: - Group verbal instruction  through game format to discuss how heart and lung disease can affect sexual intimacy. Written material given at graduation..   Know Your Numbers and Heart Failure: - Group verbal and visual instruction to discuss disease risk factors for cardiac and pulmonary disease and treatment options.  Reviews associated critical values for Overweight/Obesity, Hypertension, Cholesterol, and Diabetes.  Discusses basics of heart failure: signs/symptoms and treatments.  Introduces Heart Failure Zone chart for action plan for heart failure.  Written material given at graduation. Flowsheet Row Cardiac Rehab from 12/13/2022 in [facility] Cardiac and Pulmonary Rehab  Education need identified 10/17/22  Date 11/22/22  Educator SB  Instruction Review Code 1- Verbalizes Understanding       Infection Prevention: - Provides verbal and written material to individual with discussion of infection control including proper hand washing and proper equipment cleaning during exercise session. Flowsheet Row Cardiac Rehab from 12/13/2022 in [facility] Cardiac and Pulmonary Rehab  Date 10/17/22  Educator [facility]  Instruction Review Code 1- Verbalizes Understanding       Falls Prevention: - Provides verbal and written material to individual with discussion of falls prevention and safety. Flowsheet Row Cardiac Rehab from 12/13/2022 in [facility] Cardiac and Pulmonary Rehab  Date 10/17/22  Educator [facility]  Instruction Review Code 1- Verbalizes Understanding       Other: -Provides group and verbal instruction on various topics (see comments) Flowsheet Row Cardiac Rehab from 12/13/2022 in [facility] Cardiac and Pulmonary Rehab  Date 10/18/22  Educator [facility]- [facility]  Instruction Review Code 1- Verbalizes Understanding       Knowledge Questionnaire Score:  Knowledge Questionnaire Score - 01/05/23 1135       Knowledge Questionnaire Score   Pre Score 20/26    Post Score 23/26             Core Components/Risk Factors/Patient Goals at  Admission:  Personal Goals and Risk Factors at Admission - 10/17/22 1140       Core Components/Risk Factors/Patient Goals on Admission    Weight Management Yes;Weight Loss;Obesity    Intervention Weight Management: Develop a combined nutrition and exercise program designed to reach desired caloric intake, while maintaining appropriate intake of nutrient and fiber, sodium and fats, and appropriate energy expenditure required for the weight goal.;Weight  Management: Provide education and appropriate resources to help participant work on and attain dietary goals.;Weight Management/Obesity: Establish reasonable short term and long term weight goals.;Obesity: Provide education and appropriate resources to help participant work on and attain dietary goals.    Admit Weight 207 lb 11.2 oz (94.2 kg)    Goal Weight: Short Term 202 lb (91.6 kg)    Goal Weight: Long Term 197 lb (89.4 kg)    Expected Outcomes Short Term: Continue to assess and modify interventions until short term weight is achieved;Long Term: Adherence to nutrition and physical activity/exercise program aimed toward attainment of established weight goal;Weight Loss: Understanding of general recommendations for a balanced deficit meal plan, which promotes 1-2 lb weight loss per week and includes a negative energy balance of [phone] kcal/d;Understanding recommendations for meals to include 15-35% energy as protein, 25-35% energy from fat, 35-60% energy from carbohydrates, less than 200mg  of dietary cholesterol, 20-35 gm of total fiber daily;Understanding of distribution of calorie intake throughout the day with the consumption of 4-5 meals/snacks    Diabetes Yes    Intervention Provide education about signs/symptoms and action to take for hypo/hyperglycemia.;Provide education about proper nutrition, including hydration, and aerobic/resistive exercise prescription along with prescribed medications to achieve blood glucose in normal ranges: Fasting  glucose 65-99 mg/dL    Expected Outcomes Short Term: Participant verbalizes understanding of the signs/symptoms and immediate care of hyper/hypoglycemia, proper foot care and importance of medication, aerobic/resistive exercise and nutrition plan for blood glucose control.;Long Term: Attainment of HbA1C < 7%.    Heart Failure Yes    Intervention Provide a combined exercise and nutrition program that is supplemented with education, support and counseling about heart failure. Directed toward relieving symptoms such as shortness of breath, decreased exercise tolerance, and extremity edema.    Expected Outcomes Improve functional capacity of life;Short term: Attendance in program 2-3 days a week with increased exercise capacity. Reported lower sodium intake. Reported increased fruit and vegetable intake. Reports medication compliance.;Short term: Daily weights obtained and reported for increase. Utilizing diuretic protocols set by physician.;Long term: Adoption of self-care skills and reduction of barriers for early signs and symptoms recognition and intervention leading to self-care maintenance.    Hypertension Yes    Intervention Provide education on lifestyle modifcations including regular physical activity/exercise, weight management, moderate sodium restriction and increased consumption of fresh fruit, vegetables, and low fat dairy, alcohol moderation, and smoking cessation.;Monitor prescription use compliance.    Expected Outcomes Short Term: Continued assessment and intervention until BP is < 140/37mm HG in hypertensive participants. < 130/55mm HG in hypertensive participants with diabetes, heart failure or chronic kidney disease.;Long Term: Maintenance of blood pressure at goal levels.    Lipids Yes    Intervention Provide education and support for participant on nutrition & aerobic/resistive exercise along with prescribed medications to achieve LDL 70mg , HDL >40mg .    Expected Outcomes Short Term:  Participant states understanding of desired cholesterol values and is compliant with medications prescribed. Participant is following exercise prescription and nutrition guidelines.;Long Term: Cholesterol controlled with medications as prescribed, with individualized exercise RX and with personalized nutrition plan. Value goals: LDL < 70mg , HDL > 40 mg.             Education:Diabetes - Individual verbal and written instruction to review signs/symptoms of diabetes, desired ranges of glucose level fasting, after meals and with exercise. Acknowledge that pre and post exercise glucose checks will be done for 3 sessions at entry of program. Flowsheet Row Cardiac Rehab  from 12/13/2022 in [facility] Cardiac and Pulmonary Rehab  Date 10/17/22  Educator [facility]  Instruction Review Code 1- Verbalizes Understanding       Core Components/Risk Factors/Patient Goals Review:   Goals and Risk Factor Review     Row Name 11/06/22 1131 11/27/22 1133 01/03/23 1144         Core Components/Risk Factors/Patient Goals Review   Personal Goals Review Weight Management/Obesity;Diabetes;Hypertension Weight Management/Obesity Weight Management/Obesity;Hypertension     Review Patient reports that he has been losing weight. He is on Ozempic for DM. He takes all his meds as prescribed and has a home health provider come to his house once a month to help him manage and set up his meds for the month. The provider also takes his BP and he monitors it independently inbetween visits. Maico wants to lose a litle more weight and reach a goal of 190lbs. He states he is doing well in the program and enjoys it. His weight was 204 pounds today. His blood pressure is doing well and checks is blood pressure at home. Ron would still like to lose some more weight. He weighed in today at 206.7 lbs but would like to lose down to his weight goal of 190 lbs. Ron does have a BP cuff but states that he has not been checking his BP at home because it  gets checked in rehab. We encouraged him to check his BP at home once he graduates from the program. He also states that he has been taking all his medications as prescribed.     Expected Outcomes Short: continue to monitor BP and work with home health care provider on managing medications. Long: continue to control cardiac risk factors. Short: lose a few pounds in the next couple weeks. Long: reach weight. Short: Continue to work towards weight loss goal. Long: Continue to manage lifestyle risk factors.              Core Components/Risk Factors/Patient Goals at Discharge (Final Review):   Goals and Risk Factor Review - 01/03/23 1144       Core Components/Risk Factors/Patient Goals Review   Personal Goals Review Weight Management/Obesity;Hypertension    Review Ron would still like to lose some more weight. He weighed in today at 206.7 lbs but would like to lose down to his weight goal of 190 lbs. Ron does have a BP cuff but states that he has not been checking his BP at home because it gets checked in rehab. We encouraged him to check his BP at home once he graduates from the program. He also states that he has been taking all his medications as prescribed.    Expected Outcomes Short: Continue to work towards weight loss goal. Long: Continue to manage lifestyle risk factors.             ITP Comments:  ITP Comments     Row Name 10/11/22 1346 10/17/22 1120 10/18/22 1037 10/18/22 1154 11/14/22 1403   ITP Comments Virtual Visit completed. Patient informed on EP and RD appointment and 6 Minute walk test. Patient also informed of patient health questionnaires on My Chart. Patient Verbalizes understanding. Visit diagnosis can be found in [facility] 07/22/2022. Completed and gym orientation. Initial ITP created and sent for review to Dr. Bethann Punches, Medical Director. First full day of exercise!  Patient was oriented to gym and equipment including functions, settings, policies, and procedures.   Patient's individual exercise prescription and treatment plan were reviewed.  All starting  workloads were established based on the results of the 6 minute walk test done at initial orientation visit.  The plan for exercise progression was also introduced and progression will be customized based on patient's performance and goals. 30 Day review completed. Medical Director ITP review done, changes made as directed, and signed approval by Medical Director.   new to program 30 Day review completed. Medical Director ITP review done, changes made as directed, and signed approval by Medical Director.    Row Name 12/13/22 1125 01/10/23 0937         ITP Comments 30 Day review completed. Medical Director ITP review done, changes made as directed, and signed approval by Medical Director. 30 Day review completed. Medical Director ITP review done, changes made as directed, and signed approval by Medical Director.               Comments:

## 2023-01-10 NOTE — Progress Notes (Signed)
Daily Session Note  Patient Details  Name: Ian Lucas MRN: [id number] Date of Birth: [birth year] Referring Provider:   Flowsheet Row Cardiac Rehab from 10/17/2022 in [facility] Cardiac and Pulmonary Rehab  Referring Provider Nelly Laurence MD       Encounter Date: 01/10/2023  Check In:  Session Check In - 01/10/23 1103       Check-In   Supervising physician immediately available to respond to emergencies See telemetry face sheet for immediately available ER MD    Location [facility]-Cardiac & Pulmonary Rehab    Staff Present Maxon Conetta BS, , Exercise Physiologist;Margaret Best, MS, Exercise Physiologist;Megan Katrinka Blazing, RN, ADN;Miko Sirico Jewel Baize, RN BSN    Virtual Visit No    Medication changes reported     No    Fall or balance concerns reported    No    Warm-up and Cool-down Performed on first and last piece of equipment    Resistance Training Performed Yes    VAD Patient? No    PAD/SET Patient? No      Pain Assessment   Currently in Pain? No/denies                Social History   Tobacco Use  Smoking Status Former   Current packs/day: 0.00   Average packs/day: 0.3 packs/day for 30.1 years (7.5 ttl pk-yrs)   Types: Cigarettes   Start date: 05/08/1968   Quit date: 06/17/1998   Years since quitting: 24.5  Smokeless Tobacco Never    Goals Met:  Independence with exercise equipment Exercise tolerated well No report of concerns or symptoms today Strength training completed today  Goals Unmet:  Not Applicable  Comments: Pt able to follow exercise prescription today without complaint.  Will continue to monitor for progression.    Dr. Bethann Punches is Medical Director for [facility] Cardiac Rehabilitation.  Dr. Vida Rigger is Medical Director for [facility] Pulmonary Rehabilitation.

## 2023-01-12 ENCOUNTER — Encounter: Payer: No Typology Code available for payment source | Admitting: *Deleted

## 2023-01-12 DIAGNOSIS — Z5189 Encounter for other specified aftercare: Secondary | ICD-10-CM | POA: Diagnosis not present

## 2023-01-12 DIAGNOSIS — I5022 Chronic systolic (congestive) heart failure: Secondary | ICD-10-CM

## 2023-01-12 NOTE — Progress Notes (Signed)
Daily Session Note  Patient Details  Name: Ian Lucas MRN: [id number] Date of Birth: Apr 08, 1951 Referring Provider:   Flowsheet Row Cardiac Rehab from 10/17/2022 in [facility] Cardiac and Pulmonary Rehab  Referring Provider Nelly Laurence MD       Encounter Date: 01/12/2023  Check In:  Session Check In - 01/12/23 1147       Check-In   Supervising physician immediately available to respond to emergencies See telemetry face sheet for immediately available ER MD    Location [facility]-Cardiac & Pulmonary Rehab    Staff Present Elige Ko, RCP,RRT,BSRT;Cora Collum, RN, BSN, CCRP;Noah Tickle, BS, Exercise Physiologist    Virtual Visit No    Medication changes reported     No    Fall or balance concerns reported    No    Warm-up and Cool-down Performed on first and last piece of equipment    Resistance Training Performed Yes    VAD Patient? No    PAD/SET Patient? No      Pain Assessment   Currently in Pain? No/denies                Social History   Tobacco Use  Smoking Status Former   Current packs/day: 0.00   Average packs/day: 0.3 packs/day for 30.1 years (7.5 ttl pk-yrs)   Types: Cigarettes   Start date: 05/08/1968   Quit date: 06/17/1998   Years since quitting: 24.5  Smokeless Tobacco Never    Goals Met:  Independence with exercise equipment Exercise tolerated well No report of concerns or symptoms today  Goals Unmet:  Not Applicable  Comments: Pt able to follow exercise prescription today without complaint.  Will continue to monitor for progression.    Dr. Bethann Punches is Medical Director for [facility] Cardiac Rehabilitation.  Dr. Vida Rigger is Medical Director for [facility] Pulmonary Rehabilitation.

## 2023-01-13 ENCOUNTER — Ambulatory Visit
Admission: RE | Admit: 2023-01-13 | Discharge: 2023-01-13 | Disposition: A | Discharge status: Home / Self Care | Payer: No Typology Code available for payment source | Source: Ambulatory Visit

## 2023-01-13 DIAGNOSIS — M542 Cervicalgia: Secondary | ICD-10-CM | POA: Diagnosis present

## 2023-01-15 ENCOUNTER — Encounter: Payer: No Typology Code available for payment source | Admitting: *Deleted

## 2023-01-15 DIAGNOSIS — Z5189 Encounter for other specified aftercare: Secondary | ICD-10-CM | POA: Diagnosis not present

## 2023-01-15 DIAGNOSIS — I5022 Chronic systolic (congestive) heart failure: Secondary | ICD-10-CM

## 2023-01-15 NOTE — Progress Notes (Signed)
Discharge Note for  Ian Lucas     July 15, 1950        Ian Lucas graduated today from  rehab with 36 sessions completed.  Details of the patient's exercise prescription and what He needs to do in order to continue the prescription and progress were discussed with patient.  Patient was given a copy of prescription and goals.  Patient verbalized understanding. Ian Lucas plans to continue to exercise by walking at home.    6 Minute Walk     Row Name 10/17/22 1120 01/03/23 1119       6 Minute Walk   Phase Initial Discharge    Distance 1180 feet 1205 feet    Distance % Change -- 2.1 %    Distance Feet Change -- 25 ft    Walk Time 6 minutes 6 minutes    # of Rest Breaks 0 0    MPH 2.23 2.28    METS 2.24 2.6    RPE 11 13    Perceived Dyspnea  -- 0    VO2 Peak 7.85 9.1    Symptoms No No    Resting HR 72 bpm 90 bpm    Resting BP 124/72 136/72    Resting Oxygen Saturation  99 % 96 %    Exercise Oxygen Saturation  during 6 min walk 95 % 92 %    Max Ex. HR 98 bpm 108 bpm    Max Ex. BP 128/64 154/76    2 Minute Post BP 124/72 --                       Thank you for the referral.

## 2023-01-15 NOTE — Progress Notes (Signed)
Daily Session Note  Patient Details  Name: Ian Lucas MRN: [id number] Date of Birth: 10-Jan-1951 Referring Provider:   Flowsheet Row Cardiac Rehab from 10/17/2022 in [facility] Cardiac and Pulmonary Rehab  Referring Provider Nelly Laurence MD       Encounter Date: 01/15/2023  Check In:  Session Check In - 01/15/23 1147       Check-In   Supervising physician immediately available to respond to emergencies See telemetry face sheet for immediately available ER MD    Location [facility]-Cardiac & Pulmonary Rehab    Staff Present Ian Lucas, BS, ACSM CEP, Exercise Physiologist;Ian Lucas BS, , Exercise Physiologist;Ian Best, MS, Exercise Physiologist;Ian Elgersma, RN, BSN, CCRP    Virtual Visit No    Medication changes reported     No    Fall or balance concerns reported    No    Warm-up and Cool-down Performed on first and last piece of equipment    Resistance Training Performed Yes    VAD Patient? No    PAD/SET Patient? No      Pain Assessment   Currently in Pain? No/denies                Social History   Tobacco Use  Smoking Status Former   Current packs/day: 0.00   Average packs/day: 0.3 packs/day for 30.1 years (7.5 ttl pk-yrs)   Types: Cigarettes   Start date: 05/08/1968   Quit date: 06/17/1998   Years since quitting: 24.5  Smokeless Tobacco Never    Goals Met:  Independence with exercise equipment Exercise tolerated well Personal goals reviewed No report of concerns or symptoms today  Goals Unmet:  Not Applicable  Comments:  Ian Lucas graduated today from  rehab with 36 sessions completed.  Details of the patient's exercise prescription and what He needs to do in order to continue the prescription and progress were discussed with patient.  Patient was given a copy of prescription and goals.  Patient verbalized understanding. Ian Lucas plans to continue to exercise by walking at home.    Dr. Bethann Punches is Medical Director for [facility] Cardiac  Rehabilitation.  Dr. Vida Rigger is Medical Director for [facility] Pulmonary Rehabilitation.

## 2023-01-15 NOTE — Progress Notes (Signed)
Cardiac Individual Treatment Plan  Patient Details  Name: Ian Lucas MRN: [id number] Date of Birth: Feb 09, 1951 Referring Provider:   Flowsheet Row Cardiac Rehab from 10/17/2022 in [facility] Cardiac and Pulmonary Rehab  Referring Provider Nelly Laurence MD       Initial Encounter Date:  Flowsheet Row Cardiac Rehab from 10/17/2022 in [facility] Cardiac and Pulmonary Rehab  Date 10/17/22       Visit Diagnosis: Heart failure, chronic systolic (HCC)  Patient's Home Medications on Admission:  Current Outpatient Medications:    albuterol (VENTOLIN HFA) 108 (90 Base) MCG/ACT inhaler, Inhale 2 puffs into the lungs every 6 (six) hours as needed for wheezing or shortness of breath., Disp: 6.7 g, Rfl: 0   Alogliptin Benzoate 25 MG TABS, Take 25 mg by mouth daily., Disp: , Rfl:    amLODipine (NORVASC) 10 MG tablet, Take 10 mg by mouth daily. , Disp: , Rfl:    carbamide peroxide (DEBROX) 6.5 % OTIC solution, Place in ear(s)., Disp: , Rfl:    carvedilol (COREG) 12.5 MG tablet, Take 1 tablet (12.5 mg total) by mouth 2 (two) times daily with a meal. (Patient not taking: Reported on 10/11/2022), Disp: 60 tablet, Rfl: 1   carvedilol (COREG) 25 MG tablet, Take by mouth., Disp: , Rfl:    Cholecalciferol 50 MCG (2000 UT) TABS, Take by mouth., Disp: , Rfl:    donepezil (ARICEPT) 10 MG tablet, Take 5 mg by mouth in the morning. (Patient not taking: Reported on 10/11/2022), Disp: , Rfl:    DULoxetine (CYMBALTA) 60 MG capsule, Take 60 mg by mouth daily. , Disp: , Rfl:    EPINEPHrine 0.3 mg/0.3 mL IJ SOAJ injection, Inject 0.3 mg into the muscle as needed for anaphylaxis., Disp: 1 each, Rfl: 1   ergocalciferol (VITAMIN D2) 1.25 MG (50000 UT) capsule, Take 50,000 Units by mouth once a week., Disp: , Rfl:    famotidine (PEPCID) 20 MG tablet, Take 20 mg by mouth daily., Disp: , Rfl:    fluticasone-salmeterol (ADVAIR HFA) 115-21 MCG/ACT inhaler, Inhale 2 puffs into the lungs 2 (two) times daily., Disp: 1 each, Rfl: 12    hydrOXYzine (ATARAX) 10 MG tablet, Take 1 tablet (10 mg total) by mouth 3 (three) times daily as needed for anxiety., Disp: 30 tablet, Rfl: 0   ibuprofen (ADVIL,MOTRIN) 600 MG tablet, Take 1 tablet (600 mg total) by mouth every 6 (six) hours as needed. (Patient not taking: Reported on 10/11/2022), Disp: 30 tablet, Rfl: 0   ipratropium-albuterol (DUONEB) 0.5-2.5 (3) MG/3ML SOLN, Inhale into the lungs., Disp: , Rfl:    irbesartan (AVAPRO) 150 MG tablet, Take 1 tablet (150 mg total) by mouth daily. (Patient not taking: Reported on 10/11/2022), Disp: 30 tablet, Rfl: 1   irbesartan (AVAPRO) 300 MG tablet, Take by mouth., Disp: , Rfl:    Melatonin-Pyridoxine (MELATONIN-B6 PO), Take 1 tablet by mouth at bedtime., Disp: , Rfl:    meloxicam (MOBIC) 15 MG tablet, Take by mouth., Disp: , Rfl:    memantine (NAMENDA) 10 MG tablet, Take 10 mg by mouth daily. (Patient not taking: Reported on 10/11/2022), Disp: , Rfl:    memantine (NAMENDA) 10 MG tablet, Take 1 tablet by mouth every morning., Disp: , Rfl:    metFORMIN (GLUCOPHAGE) 500 MG tablet, Take 500 mg by mouth daily with breakfast., Disp: , Rfl:    polyethylene glycol (MIRALAX / GLYCOLAX) 17 g packet, Take 17 g by mouth daily as needed., Disp: 14 each, Rfl: 0   potassium chloride (KLOR-CON)  20 MEQ packet, Take 40 mEq by mouth daily for 7 days., Disp: 14 packet, Rfl: 0   pregabalin (LYRICA) 150 MG capsule, Take 1 capsule by mouth at bedtime., Disp: , Rfl:    pregabalin (LYRICA) 300 MG capsule, Take 200 mg by mouth 2 (two) times daily. (Patient not taking: Reported on 10/11/2022), Disp: , Rfl:    pyridOXINE (B-6) 50 MG tablet, Take by mouth., Disp: , Rfl:    rosuvastatin (CRESTOR) 10 MG tablet, Take 10 mg by mouth daily., Disp: , Rfl:    Semaglutide,0.25 or 0.5MG /DOS, 2 MG/3ML SOPN, Inject into the skin. (Patient not taking: Reported on 10/11/2022), Disp: , Rfl:    senna-docusate (SENOKOT-S) 8.6-50 MG tablet, Take by mouth., Disp: , Rfl:    Spacer/Aero-Holding Chambers  (AEROCHAMBER MV) inhaler, Use as instructed, Disp: 1 each, Rfl: 2   spironolactone (ALDACTONE) 25 MG tablet, Take by mouth., Disp: , Rfl:    tamsulosin (FLOMAX) 0.4 MG CAPS capsule, Take 0.4 mg by mouth 2 (two) times a day. , Disp: , Rfl:    tiotropium (SPIRIVA) 18 MCG inhalation capsule, Place 18 mcg into inhaler and inhale daily., Disp: , Rfl:    traZODone (DESYREL) 50 MG tablet, Take 1 tablet (50 mg total) by mouth at bedtime., Disp: 30 tablet, Rfl: 0  Past Medical History: Past Medical History:  Diagnosis Date   AKI (acute kidney injury) (HCC) 07/26/2022   Arthritis    Rheumatoid   Complication of anesthesia    has become combative following surgery   Diabetes mellitus without complication (HCC)    Type 2   History of kidney stones    x 2   History of shingles    Hypercholesteremia    Hypertension    Neuropathy    Pancreatitis    PTSD (post-traumatic stress disorder)    Sleep apnea    CPAP machine   Vertigo    occasional    Tobacco Use: Social History   Tobacco Use  Smoking Status Former   Current packs/day: 0.00   Average packs/day: 0.3 packs/day for 30.1 years (7.5 ttl pk-yrs)   Types: Cigarettes   Start date: 05/08/1968   Quit date: 06/17/1998   Years since quitting: 24.5  Smokeless Tobacco Never    Labs: Review Flowsheet  More data exists      Latest Ref Rng & Units 03/14/2016 08/21/2018 11/15/2018 07/22/2022 07/26/2022  Labs for ITP Cardiac and Pulmonary Rehab  Cholestrol 0 - 200 mg/dL - 161     096  - -  LDL (calc) 0 - 99 mg/dL - 045     409  - -  HDL-C >40 mg/dL - 67     62  - -  Trlycerides <150 mg/dL - 811     57  - -  Hemoglobin A1c 4.8 - 5.6 % 6.8  6.7     6.0  - 8.7   Bicarbonate 20.0 - 28.0 mmol/L - - - 27.9  -  O2 Saturation % - - - 58.5  -    Details       This result is from an external source.          Exercise Target Goals: Exercise Program Goal: Individual exercise prescription set using results from initial 6 min walk test and THRR  while considering  patient's activity barriers and safety.   Exercise Prescription Goal: Initial exercise prescription builds to 30-45 minutes a day of aerobic activity, 2-3 days per week.  Home exercise guidelines  will be given to patient during program as part of exercise prescription that the participant will acknowledge.   Education: Aerobic Exercise: - Group verbal and visual presentation on the components of exercise prescription. Introduces F.I.T.T principle from ACSM for exercise prescriptions.  Reviews F.I.T.T. principles of aerobic exercise including progression. Written material given at graduation. Flowsheet Row Cardiac Rehab from 01/10/2023 in [facility] Cardiac and Pulmonary Rehab  Education need identified 10/17/22       Education: Resistance Exercise: - Group verbal and visual presentation on the components of exercise prescription. Introduces F.I.T.T principle from ACSM for exercise prescriptions  Reviews F.I.T.T. principles of resistance exercise including progression. Written material given at graduation. Flowsheet Row Cardiac Rehab from 01/10/2023 in [facility] Cardiac and Pulmonary Rehab  Date 10/25/22  Educator NT  Instruction Review Code 1- Verbalizes Understanding        Education: Exercise & Equipment Safety: - Individual verbal instruction and demonstration of equipment use and safety with use of the equipment. Flowsheet Row Cardiac Rehab from 01/10/2023 in [facility] Cardiac and Pulmonary Rehab  Date 10/17/22  Educator [facility]  Instruction Review Code 1- Verbalizes Understanding       Education: Exercise Physiology & General Exercise Guidelines: - Group verbal and written instruction with models to review the exercise physiology of the cardiovascular system and associated critical values. Provides general exercise guidelines with specific guidelines to those with heart or lung disease.  Flowsheet Row Cardiac Rehab from 01/10/2023 in [facility] Cardiac and Pulmonary Rehab  Date 12/13/22   Educator SB  Instruction Review Code 1- [employer] Understanding       Education: Flexibility, Balance, Mind/Body Relaxation: - Group verbal and visual presentation with interactive activity on the components of exercise prescription. Introduces F.I.T.T principle from ACSM for exercise prescriptions. Reviews F.I.T.T. principles of flexibility and balance exercise training including progression. Also discusses the mind body connection.  Reviews various relaxation techniques to help reduce and manage stress (i.e. Deep breathing, progressive muscle relaxation, and visualization). Balance handout provided to take home. Written material given at graduation. Flowsheet Row Cardiac Rehab from 01/10/2023 in [facility] Cardiac and Pulmonary Rehab  Date 10/25/22  Educator NT  Instruction Review Code 1- Verbalizes Understanding       Activity Barriers & Risk Stratification:  Activity Barriers & Cardiac Risk Stratification - 10/17/22 1120       Activity Barriers & Cardiac Risk Stratification   Activity Barriers Deconditioning;Muscular Weakness;Back Problems;Balance Concerns;Assistive Device;Other (comment)    Comments R hand cold and numbness, back pain with standing    Cardiac Risk Stratification High             6 Minute Walk:  6 Minute Walk     Row Name 10/17/22 1120 01/03/23 1119       6 Minute Walk   Phase Initial Discharge    Distance 1180 feet 1205 feet    Distance % Change -- 2.1 %    Distance Feet Change -- 25 ft    Walk Time 6 minutes 6 minutes    # of Rest Breaks 0 0    MPH 2.23 2.28    METS 2.24 2.6    RPE 11 13    Perceived Dyspnea  -- 0    VO2 Peak 7.85 9.1    Symptoms No No    Resting HR 72 bpm 90 bpm    Resting BP 124/72 136/72    Resting Oxygen Saturation  99 % 96 %    Exercise Oxygen Saturation  during 6  min walk 95 % 92 %    Max Ex. HR 98 bpm 108 bpm    Max Ex. BP 128/64 154/76    2 Minute Post BP 124/72 --             Oxygen Initial  Assessment:   Oxygen Re-Evaluation:   Oxygen Discharge (Final Oxygen Re-Evaluation):   Initial Exercise Prescription:  Initial Exercise Prescription - 10/17/22 1100       Date of Initial Exercise RX and Referring Provider   Date 10/17/22    Referring Provider Nelly Laurence MD      Oxygen   Maintain Oxygen Saturation 88% or higher      Treadmill   MPH 2    Grade 0.5    Minutes 15    METs 2.67      Recumbant Bike   Level 1    RPM 50    Watts 7    Minutes 15    METs 2.5      NuStep   Level 1    SPM 80    Minutes 15    METs 2.5      REL-XR   Level 1    Speed 50    Minutes 15    METs 2.5      Track   Laps 30    Minutes 15    METs 2.63      Prescription Details   Frequency (times per week) 3    Duration Progress to 30 minutes of continuous aerobic without signs/symptoms of physical distress      Intensity   THRR 40-80% of Max Heartrate 102-132    Ratings of Perceived Exertion 11-13    Perceived Dyspnea 0-4      Progression   Progression Continue to progress workloads to maintain intensity without signs/symptoms of physical distress.      Resistance Training   Training Prescription Yes    Weight 3 lb             Perform Capillary Blood Glucose checks as needed.  Exercise Prescription Changes:   Exercise Prescription Changes     Row Name 10/17/22 1100 11/01/22 0800 11/15/22 1600 11/29/22 0700 12/13/22 1400     Response to Exercise   Blood Pressure (Admit) 124/72 122/68 112/64 132/80 122/78   Blood Pressure (Exercise) 128/64 148/78 148/80 136/66 162/82   Blood Pressure (Exit) 124/72 122/70 122/64 118/64 124/74   Heart Rate (Admit) 72 bpm 79 bpm 81 bpm 88 bpm 88 bpm   Heart Rate (Exercise) 98 bpm 99 bpm 97 bpm 93 bpm 120 bpm   Heart Rate (Exit) 79 bpm 73 bpm 75 bpm 85 bpm 94 bpm   Oxygen Saturation (Admit) 99 % -- -- -- --   Oxygen Saturation (Exercise) 95 % -- -- -- --   Rating of Perceived Exertion (Exercise) 11 13 13 13 13    Symptoms  none none -- -- none   Comments walk test results First two weeks of exercise -- -- --   Duration -- Continue with 30 min of aerobic exercise without signs/symptoms of physical distress. Continue with 30 min of aerobic exercise without signs/symptoms of physical distress. Continue with 30 min of aerobic exercise without signs/symptoms of physical distress. Continue with 30 min of aerobic exercise without signs/symptoms of physical distress.   Intensity -- THRR unchanged THRR unchanged THRR unchanged THRR unchanged     Progression   Progression -- Continue to progress workloads to maintain intensity without signs/symptoms  of physical distress. Continue to progress workloads to maintain intensity without signs/symptoms of physical distress. Continue to progress workloads to maintain intensity without signs/symptoms of physical distress. Continue to progress workloads to maintain intensity without signs/symptoms of physical distress.   Average METs -- 1.78 1.74 2.02 2.5     Resistance Training   Training Prescription -- Yes Yes Yes Yes   Weight -- 3 lb 0 lb 0 lb 3   Reps -- 10-15 10-15 10-15 10-15     Interval Training   Interval Training -- No No No No     Treadmill   MPH -- 1.2 -- -- --   Grade -- 0 -- -- --   Minutes -- 15 -- -- --   METs -- 1.92 -- -- --     Recumbant Bike   Level -- 2 -- -- 3   RPM -- -- -- -- 3   Watts -- -- -- -- --   Minutes -- 15 -- -- 15   METs -- 2.1 -- -- 2.1     NuStep   Level -- 5 5 3  --   Minutes -- 15 15 15  --   METs -- 1 1.5 2.7 --     REL-XR   Level -- 1 1 1 2    Minutes -- 15 15 15 15    METs -- 2.1 2 2  2.1     Track   Laps -- 14 14 -- 40   Minutes -- 15 15 -- 15   METs -- 1.76 1.76 -- 3.18     Oxygen   Maintain Oxygen Saturation -- 88% or higher 88% or higher 88% or higher 88% or higher    Row Name 12/25/22 1100 12/26/22 1400 01/09/23 1500         Response to Exercise   Blood Pressure (Admit) -- 124/70 134/68     Blood Pressure  (Exit) -- 140/72 118/64     Heart Rate (Admit) -- 81 bpm 84 bpm     Heart Rate (Exercise) -- 96 bpm 103 bpm     Heart Rate (Exit) -- 88 bpm 87 bpm     Oxygen Saturation (Admit) -- 97 % 97 %     Oxygen Saturation (Exercise) -- 89 % 91 %     Oxygen Saturation (Exit) -- 96 % 97 %     Rating of Perceived Exertion (Exercise) -- 15 13     Perceived Dyspnea (Exercise) -- -- 0     Symptoms -- none none     Duration Continue with 30 min of aerobic exercise without signs/symptoms of physical distress. Continue with 30 min of aerobic exercise without signs/symptoms of physical distress. Continue with 30 min of aerobic exercise without signs/symptoms of physical distress.     Intensity THRR unchanged THRR unchanged THRR unchanged       Progression   Progression Continue to progress workloads to maintain intensity without signs/symptoms of physical distress. Continue to progress workloads to maintain intensity without signs/symptoms of physical distress. Continue to progress workloads to maintain intensity without signs/symptoms of physical distress.     Average METs 2.5 2.2 2.15       Resistance Training   Training Prescription Yes Yes Yes     Weight 3 3 3      Reps 10-15 10-15 10-15       Interval Training   Interval Training No No No       Treadmill   MPH -- 1.7 --  Grade -- 0 --     Minutes -- 15 --     METs -- 2.3 --       Recumbant Bike   Level 3 -- --     RPM 3 -- --     Minutes 15 -- --     METs 2.1 -- --       NuStep   Level -- 2  T6 2     Minutes -- 15 15     METs -- 1.8  T6 1.8       REL-XR   Level 2 2 1      Minutes 15 15 15      METs 2.1 2.4 --       T5 Nustep   Level -- -- 2  T6     Minutes -- -- 15     METs -- -- 2.3       Track   Laps 40 30 30     Minutes 15 15 15      METs 3.18 2.63 2.63       Home Exercise Plan   Plans to continue exercise at Home (comment)  walk at home Home (comment)  walk at home Home (comment)  walk at home     Frequency Add 2  additional days to program exercise sessions. Add 2 additional days to program exercise sessions. Add 2 additional days to program exercise sessions.     Initial Home Exercises Provided 12/25/22 12/25/22 12/25/22       Oxygen   Maintain Oxygen Saturation 88% or higher 88% or higher 88% or higher              Exercise Comments:   Exercise Comments     Row Name 10/18/22 1037 01/15/23 1148         Exercise Comments First full day of exercise!  Patient was oriented to gym and equipment including functions, settings, policies, and procedures.  Patient's individual exercise prescription and treatment plan were reviewed.  All starting workloads were established based on the results of the 6 minute walk test done at initial orientation visit.  The plan for exercise progression was also introduced and progression will be customized based on patient's performance and goals. Yonatan graduated today from  rehab with 36 sessions completed.  Details of the patient's exercise prescription and what He needs to do in order to continue the prescription and progress were discussed with patient.  Patient was given a copy of prescription and goals.  Patient verbalized understanding. Kymari plans to continue to exercise by walking at home.               Exercise Goals and Review:   Exercise Goals     Row Name 10/17/22 1137             Exercise Goals   Increase Physical Activity Yes       Intervention Provide advice, education, support and counseling about physical activity/exercise needs.;Develop an individualized exercise prescription for aerobic and resistive training based on initial evaluation findings, risk stratification, comorbidities and participant's personal goals.       Expected Outcomes Short Term: Attend rehab on a regular basis to increase amount of physical activity.;Long Term: Add in home exercise to make exercise part of routine and to increase amount of physical activity.;Long  Term: Exercising regularly at least 3-5 days a week.       Increase Strength and Stamina Yes  Intervention Provide advice, education, support and counseling about physical activity/exercise needs.;Develop an individualized exercise prescription for aerobic and resistive training based on initial evaluation findings, risk stratification, comorbidities and participant's personal goals.       Expected Outcomes Short Term: Increase workloads from initial exercise prescription for resistance, speed, and METs.;Short Term: Perform resistance training exercises routinely during rehab and add in resistance training at home;Long Term: Improve cardiorespiratory fitness, muscular endurance and strength as measured by increased METs and functional capacity ( )       Able to understand and use rate of perceived exertion (RPE) scale Yes       Intervention Provide education and explanation on how to use RPE scale       Expected Outcomes Long Term:  Able to use RPE to guide intensity level when exercising independently;Short Term: Able to use RPE daily in rehab to express subjective intensity level       Able to understand and use Dyspnea scale Yes       Intervention Provide education and explanation on how to use Dyspnea scale       Expected Outcomes Short Term: Able to use Dyspnea scale daily in rehab to express subjective sense of shortness of breath during exertion;Long Term: Able to use Dyspnea scale to guide intensity level when exercising independently       Knowledge and understanding of Target Heart Rate Range (THRR) Yes       Intervention Provide education and explanation of THRR including how the numbers were predicted and where they are located for reference       Expected Outcomes Short Term: Able to state/look up THRR;Short Term: Able to use daily as guideline for intensity in rehab;Long Term: Able to use THRR to govern intensity when exercising independently       Able to check pulse independently  Yes       Intervention Provide education and demonstration on how to check pulse in carotid and radial arteries.;Review the importance of being able to check your own pulse for safety during independent exercise       Expected Outcomes Short Term: Able to explain why pulse checking is important during independent exercise;Long Term: Able to check pulse independently and accurately       Understanding of Exercise Prescription Yes       Intervention Provide education, explanation, and written materials on patient's individual exercise prescription       Expected Outcomes Short Term: Able to explain program exercise prescription;Long Term: Able to explain home exercise prescription to exercise independently                Exercise Goals Re-Evaluation :  Exercise Goals Re-Evaluation     Row Name 10/18/22 1038 11/01/22 0858 11/15/22 1631 11/29/22 0747 12/13/22 1422     Exercise Goal Re-Evaluation   Exercise Goals Review Increase Physical Activity;Able to understand and use rate of perceived exertion (RPE) scale;Knowledge and understanding of Target Heart Rate Range (THRR);Understanding of Exercise Prescription;Increase Strength and Stamina;Able to check pulse independently Increase Physical Activity;Increase Strength and Stamina;Understanding of Exercise Prescription Increase Physical Activity;Increase Strength and Stamina;Understanding of Exercise Prescription Understanding of Exercise Prescription Increase Physical Activity;Increase Strength and Stamina;Understanding of Exercise Prescription   Comments Reviewed RPE  and dyspnea scale, THR and program prescription with pt today.  Pt voiced understanding and was given a copy of goals to take home. Ron is off to a good start in the program. He was able to walk up to 1.2  mph with no incline on the treadmill. He also improved to level 5 on the T4 nustep and level 2 on the recumbent bike. We will continue to monitor his progress in the program. Ron  continues to exercise at same loads as last review. He is not using weight during resistive work. Will encourage him to increase some wrokloads at tolerated with goals of increasing strength and stamina.  Will continue to monitor exercise progression Ron continues to work oat or less levels than last review. HIs RPE is at 13. He continues to do ROM during resistive work.  WIll encourage him to increase at leaset one workload.  Will continue to monitor exercise progression. Ron continues to do well in rehab. He was able to increase his hand weights from 0 to 3lbs. He also increased his track laps to 40. We will continue to monitor his progress in the program.   Expected Outcomes Short: Use RPE daily to regulate intensity.  Long: Follow program prescription in THR. Short: Continue to follow initial exercise prescription. Long: Continue to improve strength and stamina. STG Continue with exercise progression as tolerate with goal of increased strength and stamina.  LTG Continued exercise progression after discharge MVH:QIONGEXB workloads on at least one piece of equipment, as tolerated. LTG: Continued exercise progression as tolerated during program and after discharge. Short: Continue to follow current exercise prescription, and progressively increase workloads. Long: Continue exercise to improve strength and stamina.    Row Name 12/25/22 1125 12/26/22 1418 01/09/23 1549         Exercise Goal Re-Evaluation   Exercise Goals Review Increase Physical Activity;Increase Strength and Stamina;Able to understand and use rate of perceived exertion (RPE) scale;Able to understand and use Dyspnea scale;Knowledge and understanding of Target Heart Rate Range (THRR);Able to check pulse independently;Understanding of Exercise Prescription Increase Physical Activity;Understanding of Exercise Prescription;Increase Strength and Stamina Increase Physical Activity;Understanding of Exercise Prescription;Increase Strength and Stamina      Comments Reviewed home exercise with pt today.  Pt plans to walk at home 1-2 days a week on non-class days and possibly join the [employer] for exercise.  Reviewed THR, pulse, RPE, sign and symptoms, pulse oximetery and when to call 911 or MD.  Also discussed weather considerations and indoor options.  Pt voiced understanding. Ron continues to do well in rehab. He has stayed consistent with 3lb weights for resistance training. He also is due for his post 6 minute walk test soon. We will continue to monitor his progress in the program. Ron continues to do well in rehab.  he recently completed his post-6MWT and improved by 4ft. He also has implemented the T6 nustep into his prescription at level 2. We will continue to monitor his progress in the program.     Expected Outcomes Short: add 1-2 days a week of exercise at home on off days of rehab. Long: continue to improve strength and stamina and become independent with exercise program. Short: Improve on post and try increasing hand weights to 4lbs. Long: Continue to increase overall METs and stamina. Short: Graduate. Long: Continue to exercise independently.              Discharge Exercise Prescription (Final Exercise Prescription Changes):  Exercise Prescription Changes - 01/09/23 1500       Response to Exercise   Blood Pressure (Admit) 134/68    Blood Pressure (Exit) 118/64    Heart Rate (Admit) 84 bpm    Heart Rate (Exercise) 103 bpm  Heart Rate (Exit) 87 bpm    Oxygen Saturation (Admit) 97 %    Oxygen Saturation (Exercise) 91 %    Oxygen Saturation (Exit) 97 %    Rating of Perceived Exertion (Exercise) 13    Perceived Dyspnea (Exercise) 0    Symptoms none    Duration Continue with 30 min of aerobic exercise without signs/symptoms of physical distress.    Intensity THRR unchanged      Progression   Progression Continue to progress workloads to maintain intensity without signs/symptoms of physical distress.    Average METs  2.15      Resistance Training   Training Prescription Yes    Weight 3    Reps 10-15      Interval Training   Interval Training No      NuStep   Level 2    Minutes 15    METs 1.8      REL-XR   Level 1    Minutes 15      T5 Nustep   Level 2   T6   Minutes 15    METs 2.3      Track   Laps 30    Minutes 15    METs 2.63      Home Exercise Plan   Plans to continue exercise at Home (comment)   walk at home   Frequency Add 2 additional days to program exercise sessions.    Initial Home Exercises Provided 12/25/22      Oxygen   Maintain Oxygen Saturation 88% or higher             Nutrition:  Target Goals: Understanding of nutrition guidelines, daily intake of sodium 1500mg , cholesterol 200mg , calories 30% from fat and 7% or less from saturated fats, daily to have 5 or more servings of fruits and vegetables.  Education: All About Nutrition: -Group instruction provided by verbal, written material, interactive activities, discussions, models, and posters to present general guidelines for heart healthy nutrition including fat, fiber, MyPlate, the role of sodium in heart healthy nutrition, utilization of the nutrition label, and utilization of this knowledge for meal planning. Follow up email sent as well. Written material given at graduation. Flowsheet Row Cardiac Rehab from 01/10/2023 in [facility] Cardiac and Pulmonary Rehab  Education need identified 10/17/22  Date 01/10/23  Educator JG  Instruction Review Code 1- Verbalizes Understanding       Biometrics:  Pre Biometrics - 10/17/22 1138       Pre Biometrics   Height 5' 7.8" (1.722 m)    Weight 207 lb 11.2 oz (94.2 kg)    Waist Circumference 41 inches    Hip Circumference 41 inches    Waist to Hip Ratio 1 %    BMI (Calculated) 31.77    Single Leg Stand 2.9 seconds             Post Biometrics - 01/03/23 1129        Post  Biometrics   Height 5' 7.8" (1.722 m)    Weight 206 lb 11.2 oz (93.8 kg)    Waist  Circumference 45.5 inches    Hip Circumference 43 inches    Waist to Hip Ratio 1.06 %    BMI (Calculated) 31.62    Single Leg Stand 3.3 seconds             Nutrition Therapy Plan and Nutrition Goals:  Nutrition Therapy & Goals - 10/17/22 1139       Intervention Plan  Intervention Prescribe, educate and counsel regarding individualized specific dietary modifications aiming towards targeted core components such as weight, hypertension, lipid management, diabetes, heart failure and other comorbidities.    Expected Outcomes Short Term Goal: Understand basic principles of dietary content, such as calories, fat, sodium, cholesterol and nutrients.;Short Term Goal: A plan has been developed with personal nutrition goals set during dietitian appointment.;Long Term Goal: Adherence to prescribed nutrition plan.             Nutrition Assessments:  MEDIFICTS Score Key: ?70 Need to make dietary changes  40-70 Heart Healthy Diet ? 40 Therapeutic Level Cholesterol Diet  Flowsheet Row Cardiac Rehab from 01/05/2023 in [facility] Cardiac and Pulmonary Rehab  Picture Your Plate Total Score on Discharge 55      Picture Your Plate Scores: <96 Unhealthy dietary pattern with much room for improvement. 41-50 Dietary pattern unlikely to meet recommendations for good health and room for improvement. 51-60 More healthful dietary pattern, with some room for improvement.  >60 Healthy dietary pattern, although there may be some specific behaviors that could be improved.    Nutrition Goals Re-Evaluation:  Nutrition Goals Re-Evaluation     Row Name 11/06/22 1146 11/27/22 1126 01/03/23 1143         Goals   Comment Patient scheduled an appointment today to meet with program RD. His appointment is 11/20/2022. Patient did not attend his dietitian appintment Ron has missed his RD appointment a few times but expressed that he would still like to meet with the RD before he graduates. We will set up an  appointment with the RD.     Expected Outcome Short: meet with RD to set specific nutrition goals. Long: adapt to a heart healhty diet to help control cardiac risk factors. -- Short: meet with RD to set specific nutrition goals. Long: adapt to a heart healhty diet to help control cardiac risk factors.              Nutrition Goals Discharge (Final Nutrition Goals Re-Evaluation):  Nutrition Goals Re-Evaluation - 01/03/23 1143       Goals   Comment Ron has missed his RD appointment a few times but expressed that he would still like to meet with the RD before he graduates. We will set up an appointment with the RD.    Expected Outcome Short: meet with RD to set specific nutrition goals. Long: adapt to a heart healhty diet to help control cardiac risk factors.             Psychosocial: Target Goals: Acknowledge presence or absence of significant depression and/or stress, maximize coping skills, provide positive support system. Participant is able to verbalize types and ability to use techniques and skills needed for reducing stress and depression.   Education: Stress, Anxiety, and Depression - Group verbal and visual presentation to define topics covered.  Reviews how body is impacted by stress, anxiety, and depression.  Also discusses healthy ways to reduce stress and to treat/manage anxiety and depression.  Written material given at graduation. Flowsheet Row Cardiac Rehab from 01/10/2023 in [facility] Cardiac and Pulmonary Rehab  Date 12/06/22  Educator SB  Instruction Review Code 1- [employer] Understanding       Education: Sleep Hygiene -Provides group verbal and written instruction about how sleep can affect your health.  Define sleep hygiene, discuss sleep cycles and impact of sleep habits. Review good sleep hygiene tips.    Initial Review & Psychosocial Screening:  Initial Psych Review & Screening -  10/11/22 1344       Initial Review   Current issues with Current  Anxiety/Panic;Current Psychotropic Meds;Current Depression;History of Depression      Family Dynamics   Good Support System? Yes    Comments He can look to his home nurse for support. His wife is a good supporty system and his son comes by three times a week.      Barriers   Psychosocial barriers to participate in program The patient should benefit from training in stress management and relaxation.;There are no identifiable barriers or psychosocial needs.      Screening Interventions   Interventions To provide support and resources with identified psychosocial needs;Provide feedback about the scores to participant    Expected Outcomes Short Term goal: Utilizing psychosocial counselor, staff and physician to assist with identification of specific Stressors or current issues interfering with healing process. Setting desired goal for each stressor or current issue identified.;Long Term Goal: Stressors or current issues are controlled or eliminated.;Short Term goal: Identification and review with participant of any Quality of Life or Depression concerns found by scoring the questionnaire.;Long Term goal: The participant improves quality of Life and PHQ9 Scores as seen by post scores and/or verbalization of changes             Quality of Life Scores:   Quality of Life - 01/05/23 1135       Quality of Life Scores   Health/Function Pre 19.67 %    Health/Function Post 26.14 %    Health/Function % Change 32.89 %    Socioeconomic Pre 26.4 %    Socioeconomic Post 30 %    Socioeconomic % Change  13.64 %    Psych/Spiritual Pre 25.71 %    Psych/Spiritual Post 25.57 %    Psych/Spiritual % Change -0.54 %    Family Pre 28.8 %    Family Post 28.5 %    Family % Change -1.04 %    GLOBAL Pre 23.47 %    GLOBAL Post 26.86 %    GLOBAL % Change 14.44 %            Scores of 19 and below usually indicate a poorer quality of life in these areas.  A difference of  2-3 points is a clinically  meaningful difference.  A difference of 2-3 points in the total score of the Quality of Life Index has been associated with significant improvement in overall quality of life, self-image, physical symptoms, and general health in studies assessing change in quality of life.  PHQ-9: Review Flowsheet  More data exists      01/05/2023 11/27/2022 11/06/2022 10/17/2022 11/25/2018  Depression screen PHQ 2/9  Decreased Interest 1 1 1  0 0  Down, Depressed, Hopeless 0 0 2 1 0  PHQ - 2 Score 1 1 3 1  0  Altered sleeping 0 0 2 0 0  Tired, decreased energy 1 0 2 1 0  Change in appetite 1 0 2 0 0  Feeling bad or failure about yourself  0 0 0 1 0  Trouble concentrating 1 1 0 1 0  Moving slowly or fidgety/restless 1 0 0 2 0  Suicidal thoughts 0 0 0 0 0  PHQ-9 Score 5 2 9 6  0  Difficult doing work/chores Not difficult at all Not difficult at all Not difficult at all Somewhat difficult Not difficult at all    Details           Interpretation of Total Score  Total Score  Depression Severity:  1-4 = Minimal depression, 5-9 = Mild depression, 10-14 = Moderate depression, 15-19 = Moderately severe depression, 20-27 = Severe depression   Psychosocial Evaluation and Intervention:  Psychosocial Evaluation - 10/11/22 1346       Psychosocial Evaluation & Interventions   Interventions Encouraged to exercise with the program and follow exercise prescription;Relaxation education;Stress management education    Comments He can look to his home nurse for support. His wife is a good supporty system and his son comes by three times a week.    Expected Outcomes Short: Start Heart[facility] to help with mood. Long: Maintain a healthy mental state    Continue Psychosocial Services  Follow up required by staff             Psychosocial Re-Evaluation:  Psychosocial Re-Evaluation     Row Name 11/06/22 1149 11/27/22 1131 01/03/23 1139         Psychosocial Re-Evaluation   Current issues with Current Psychotropic  Meds;Current Depression Current Psychotropic Meds;Current Depression None Identified;Current Sleep Concerns     Comments Patient reports that he has been on mood medications for a long time and that he follow up with his doctor if new concerns arise. He currently takes all his meds a presrcibed and feels like he is maintaining his mental health. Reviewed patient health questionnaire (PHQ-9) with patient for follow up. Previously, patients score indicated signs/symptoms of depression.  Reviewed to see if patient is improving symptom wise while in program.  Score improved and patient states that it is because he is able to come to rehab and enjoys it. Ron reports no current stressors at this time. He is sleeping well since adjusting to using his CPAP machine. He also states that his wife, son and daughter make up a good support system for him. He also states that yardwork and exercise have been a good stress releiver for him.     Expected Outcomes Short: continue to take mood medications and follow up with doctor if any changes or concerns arise. Long: maintain good mental health habits to help control a stable mood. Short: Continue to attend [facility] regularly for regular exercise and social engagement. Long: Continue to improve symptoms and manage a positive mental state. Short: Continue to exercise for mental boost. Long: Continue to maintain positive outlook.     Interventions Encouraged to attend Cardiac Rehabilitation for the exercise Encouraged to attend Cardiac Rehabilitation for the exercise Encouraged to attend Cardiac Rehabilitation for the exercise     Continue Psychosocial Services  Follow up required by staff Follow up required by staff Follow up required by staff              Psychosocial Discharge (Final Psychosocial Re-Evaluation):  Psychosocial Re-Evaluation - 01/03/23 1139       Psychosocial Re-Evaluation   Current issues with None Identified;Current Sleep Concerns    Comments  Ron reports no current stressors at this time. He is sleeping well since adjusting to using his CPAP machine. He also states that his wife, son and daughter make up a good support system for him. He also states that yardwork and exercise have been a good stress releiver for him.    Expected Outcomes Short: Continue to exercise for mental boost. Long: Continue to maintain positive outlook.    Interventions Encouraged to attend Cardiac Rehabilitation for the exercise    Continue Psychosocial Services  Follow up required by staff  Vocational Rehabilitation: Provide vocational rehab assistance to qualifying candidates.   Vocational Rehab Evaluation & Intervention:   Education: Education Goals: Education classes will be provided on a variety of topics geared toward better understanding of heart health and risk factor modification. Participant will state understanding/return demonstration of topics presented as noted by education test scores.  Learning Barriers/Preferences:  Learning Barriers/Preferences - 10/11/22 1343       Learning Barriers/Preferences   Learning Barriers None    Learning Preferences None             General Cardiac Education Topics:  AED/CPR: - Group verbal and written instruction with the use of models to demonstrate the basic use of the AED with the basic ABC's of resuscitation.   Anatomy and Cardiac Procedures: - Group verbal and visual presentation and models provide information about basic cardiac anatomy and function. Reviews the testing methods done to diagnose heart disease and the outcomes of the test results. Describes the treatment choices: Medical Management, Angioplasty, or Coronary Bypass Surgery for treating various heart conditions including Myocardial Infarction, Angina, Valve Disease, and Cardiac Arrhythmias.  Written material given at graduation. Flowsheet Row Cardiac Rehab from 01/10/2023 in [facility] Cardiac and Pulmonary Rehab   Education need identified 10/17/22  Date 11/08/22  Educator SB  Instruction Review Code 1- Verbalizes Understanding       Medication Safety: - Group verbal and visual instruction to review commonly prescribed medications for heart and lung disease. Reviews the medication, class of the drug, and side effects. Includes the steps to properly store meds and maintain the prescription regimen.  Written material given at graduation.   Intimacy: - Group verbal instruction through game format to discuss how heart and lung disease can affect sexual intimacy. Written material given at graduation..   Know Your Numbers and Heart Failure: - Group verbal and visual instruction to discuss disease risk factors for cardiac and pulmonary disease and treatment options.  Reviews associated critical values for Overweight/Obesity, Hypertension, Cholesterol, and Diabetes.  Discusses basics of heart failure: signs/symptoms and treatments.  Introduces Heart Failure Zone chart for action plan for heart failure.  Written material given at graduation. Flowsheet Row Cardiac Rehab from 01/10/2023 in [facility] Cardiac and Pulmonary Rehab  Education need identified 10/17/22  Date 11/22/22  Educator SB  Instruction Review Code 1- Verbalizes Understanding       Infection Prevention: - Provides verbal and written material to individual with discussion of infection control including proper hand washing and proper equipment cleaning during exercise session. Flowsheet Row Cardiac Rehab from 01/10/2023 in [facility] Cardiac and Pulmonary Rehab  Date 10/17/22  Educator [facility]  Instruction Review Code 1- Verbalizes Understanding       Falls Prevention: - Provides verbal and written material to individual with discussion of falls prevention and safety. Flowsheet Row Cardiac Rehab from 01/10/2023 in [facility] Cardiac and Pulmonary Rehab  Date 10/17/22  Educator [place]  Instruction Review Code 1- Verbalizes Understanding        Other: -Provides group and verbal instruction on various topics (see comments) Flowsheet Row Cardiac Rehab from 01/10/2023 in [facility] Cardiac and Pulmonary Rehab  Date 10/18/22  Educator [facility]- [facility]  Instruction Review Code 1- Verbalizes Understanding       Knowledge Questionnaire Score:  Knowledge Questionnaire Score - 01/05/23 1135       Knowledge Questionnaire Score   Pre Score 20/26    Post Score 23/26             Core Components/Risk Factors/Patient  Goals at Admission:  Personal Goals and Risk Factors at Admission - 10/17/22 1140       Core Components/Risk Factors/Patient Goals on Admission    Weight Management Yes;Weight Loss;Obesity    Intervention Weight Management: Develop a combined nutrition and exercise program designed to reach desired caloric intake, while maintaining appropriate intake of nutrient and fiber, sodium and fats, and appropriate energy expenditure required for the weight goal.;Weight Management: Provide education and appropriate resources to help participant work on and attain dietary goals.;Weight Management/Obesity: Establish reasonable short term and long term weight goals.;Obesity: Provide education and appropriate resources to help participant work on and attain dietary goals.    Admit Weight 207 lb 11.2 oz (94.2 kg)    Goal Weight: Short Term 202 lb (91.6 kg)    Goal Weight: Long Term 197 lb (89.4 kg)    Expected Outcomes Short Term: Continue to assess and modify interventions until short term weight is achieved;Long Term: Adherence to nutrition and physical activity/exercise program aimed toward attainment of established weight goal;Weight Loss: Understanding of general recommendations for a balanced deficit meal plan, which promotes 1-2 lb weight loss per week and includes a negative energy balance of [phone] kcal/d;Understanding recommendations for meals to include 15-35% energy as protein, 25-35% energy from fat, 35-60% energy from  carbohydrates, less than 200mg  of dietary cholesterol, 20-35 gm of total fiber daily;Understanding of distribution of calorie intake throughout the day with the consumption of 4-5 meals/snacks    Diabetes Yes    Intervention Provide education about signs/symptoms and action to take for hypo/hyperglycemia.;Provide education about proper nutrition, including hydration, and aerobic/resistive exercise prescription along with prescribed medications to achieve blood glucose in normal ranges: Fasting glucose 65-99 mg/dL    Expected Outcomes Short Term: Participant verbalizes understanding of the signs/symptoms and immediate care of hyper/hypoglycemia, proper foot care and importance of medication, aerobic/resistive exercise and nutrition plan for blood glucose control.;Long Term: Attainment of HbA1C < 7%.    Heart Failure Yes    Intervention Provide a combined exercise and nutrition program that is supplemented with education, support and counseling about heart failure. Directed toward relieving symptoms such as shortness of breath, decreased exercise tolerance, and extremity edema.    Expected Outcomes Improve functional capacity of life;Short term: Attendance in program 2-3 days a week with increased exercise capacity. Reported lower sodium intake. Reported increased fruit and vegetable intake. Reports medication compliance.;Short term: Daily weights obtained and reported for increase. Utilizing diuretic protocols set by physician.;Long term: Adoption of self-care skills and reduction of barriers for early signs and symptoms recognition and intervention leading to self-care maintenance.    Hypertension Yes    Intervention Provide education on lifestyle modifcations including regular physical activity/exercise, weight management, moderate sodium restriction and increased consumption of fresh fruit, vegetables, and low fat dairy, alcohol moderation, and smoking cessation.;Monitor prescription use compliance.     Expected Outcomes Short Term: Continued assessment and intervention until BP is < 140/76mm HG in hypertensive participants. < 130/42mm HG in hypertensive participants with diabetes, heart failure or chronic kidney disease.;Long Term: Maintenance of blood pressure at goal levels.    Lipids Yes    Intervention Provide education and support for participant on nutrition & aerobic/resistive exercise along with prescribed medications to achieve LDL 70mg , HDL >40mg .    Expected Outcomes Short Term: Participant states understanding of desired cholesterol values and is compliant with medications prescribed. Participant is following exercise prescription and nutrition guidelines.;Long Term: Cholesterol controlled with medications as prescribed, with individualized exercise  RX and with personalized nutrition plan. Value goals: LDL < 70mg , HDL > 40 mg.             Education:Diabetes - Individual verbal and written instruction to review signs/symptoms of diabetes, desired ranges of glucose level fasting, after meals and with exercise. Acknowledge that pre and post exercise glucose checks will be done for 3 sessions at entry of program. Flowsheet Row Cardiac Rehab from 01/10/2023 in [facility] Cardiac and Pulmonary Rehab  Date 10/17/22  Educator [facility]  Instruction Review Code 1- Verbalizes Understanding       Core Components/Risk Factors/Patient Goals Review:   Goals and Risk Factor Review     Row Name 11/06/22 1131 11/27/22 1133 01/03/23 1144         Core Components/Risk Factors/Patient Goals Review   Personal Goals Review Weight Management/Obesity;Diabetes;Hypertension Weight Management/Obesity Weight Management/Obesity;Hypertension     Review Patient reports that he has been losing weight. He is on Ozempic for DM. He takes all his meds as prescribed and has a home health provider come to his house once a month to help him manage and set up his meds for the month. The provider also takes his BP and he  monitors it independently inbetween visits. Christopher wants to lose a litle more weight and reach a goal of 190lbs. He states he is doing well in the program and enjoys it. His weight was 204 pounds today. His blood pressure is doing well and checks is blood pressure at home. Ron would still like to lose some more weight. He weighed in today at 206.7 lbs but would like to lose down to his weight goal of 190 lbs. Ron does have a BP cuff but states that he has not been checking his BP at home because it gets checked in rehab. We encouraged him to check his BP at home once he graduates from the program. He also states that he has been taking all his medications as prescribed.     Expected Outcomes Short: continue to monitor BP and work with home health care provider on managing medications. Long: continue to control cardiac risk factors. Short: lose a few pounds in the next couple weeks. Long: reach weight. Short: Continue to work towards weight loss goal. Long: Continue to manage lifestyle risk factors.              Core Components/Risk Factors/Patient Goals at Discharge (Final Review):   Goals and Risk Factor Review - 01/03/23 1144       Core Components/Risk Factors/Patient Goals Review   Personal Goals Review Weight Management/Obesity;Hypertension    Review Ron would still like to lose some more weight. He weighed in today at 206.7 lbs but would like to lose down to his weight goal of 190 lbs. Ron does have a BP cuff but states that he has not been checking his BP at home because it gets checked in rehab. We encouraged him to check his BP at home once he graduates from the program. He also states that he has been taking all his medications as prescribed.    Expected Outcomes Short: Continue to work towards weight loss goal. Long: Continue to manage lifestyle risk factors.             ITP Comments:  ITP Comments     Row Name 10/11/22 1346 10/17/22 1120 10/18/22 1037 10/18/22 1154 11/14/22 1403    ITP Comments Virtual Visit completed. Patient informed on EP and RD appointment and 6 Minute walk  test. Patient also informed of patient health questionnaires on My Chart. Patient Verbalizes understanding. Visit diagnosis can be found in [facility] 07/22/2022. Completed and gym orientation. Initial ITP created and sent for review to Dr. Bethann Punches, Medical Director. First full day of exercise!  Patient was oriented to gym and equipment including functions, settings, policies, and procedures.  Patient's individual exercise prescription and treatment plan were reviewed.  All starting workloads were established based on the results of the 6 minute walk test done at initial orientation visit.  The plan for exercise progression was also introduced and progression will be customized based on patient's performance and goals. 30 Day review completed. Medical Director ITP review done, changes made as directed, and signed approval by Medical Director.   new to program 30 Day review completed. Medical Director ITP review done, changes made as directed, and signed approval by Medical Director.    Row Name 12/13/22 1125 01/10/23 0937 01/15/23 1148       ITP Comments 30 Day review completed. Medical Director ITP review done, changes made as directed, and signed approval by Medical Director. 30 Day review completed. Medical Director ITP review done, changes made as directed, and signed approval by Medical Director. Naiem graduated today from  rehab with 36 sessions completed.  Details of the patient's exercise prescription and what He needs to do in order to continue the prescription and progress were discussed with patient.  Patient was given a copy of prescription and goals.  Patient verbalized understanding. Terril plans to continue to exercise by walking at home.              Comments: DIscharge

## 2023-01-17 DIAGNOSIS — E113313 Type 2 diabetes mellitus with moderate nonproliferative diabetic retinopathy with macular edema, bilateral: Secondary | ICD-10-CM | POA: Diagnosis not present

## 2023-02-23 DIAGNOSIS — M4802 Spinal stenosis, cervical region: Secondary | ICD-10-CM | POA: Diagnosis not present

## 2023-02-23 DIAGNOSIS — M5412 Radiculopathy, cervical region: Secondary | ICD-10-CM | POA: Diagnosis not present

## 2023-02-23 DIAGNOSIS — M5030 Other cervical disc degeneration, unspecified cervical region: Secondary | ICD-10-CM | POA: Diagnosis not present

## 2023-02-23 DIAGNOSIS — M25511 Pain in right shoulder: Secondary | ICD-10-CM | POA: Diagnosis not present

## 2023-02-23 DIAGNOSIS — G5601 Carpal tunnel syndrome, right upper limb: Secondary | ICD-10-CM | POA: Diagnosis not present

## 2023-04-23 DIAGNOSIS — E113313 Type 2 diabetes mellitus with moderate nonproliferative diabetic retinopathy with macular edema, bilateral: Secondary | ICD-10-CM | POA: Diagnosis not present

## 2023-06-18 DIAGNOSIS — H04123 Dry eye syndrome of bilateral lacrimal glands: Secondary | ICD-10-CM | POA: Diagnosis not present

## 2023-06-18 DIAGNOSIS — E113313 Type 2 diabetes mellitus with moderate nonproliferative diabetic retinopathy with macular edema, bilateral: Secondary | ICD-10-CM | POA: Diagnosis not present

## 2023-06-18 DIAGNOSIS — Z961 Presence of intraocular lens: Secondary | ICD-10-CM | POA: Diagnosis not present

## 2023-06-18 DIAGNOSIS — B880 Other acariasis: Secondary | ICD-10-CM | POA: Diagnosis not present

## 2023-08-29 ENCOUNTER — Other Ambulatory Visit: Payer: Self-pay

## 2023-08-29 DIAGNOSIS — F01A0 Vascular dementia, mild, without behavioral disturbance, psychotic disturbance, mood disturbance, and anxiety: Secondary | ICD-10-CM

## 2023-09-05 ENCOUNTER — Ambulatory Visit
Admission: RE | Admit: 2023-09-05 | Discharge: 2023-09-05 | Disposition: A | Discharge status: Home / Self Care | Source: Ambulatory Visit

## 2023-09-05 DIAGNOSIS — F01A0 Vascular dementia, mild, without behavioral disturbance, psychotic disturbance, mood disturbance, and anxiety: Secondary | ICD-10-CM | POA: Diagnosis present

## 2023-09-05 MED ORDER — GADOBUTROL 1 MMOL/ML IV SOLN
9.0000 mL | Freq: Once | INTRAVENOUS | Status: AC | PRN
  Administered 2023-09-05: 9 mL via INTRAVENOUS

## 2023-12-03 ENCOUNTER — Other Ambulatory Visit: Payer: Self-pay | Admitting: Orthopedic Surgery

## 2023-12-03 DIAGNOSIS — M4807 Spinal stenosis, lumbosacral region: Secondary | ICD-10-CM

## 2023-12-10 ENCOUNTER — Ambulatory Visit
Admission: RE | Admit: 2023-12-10 | Discharge: 2023-12-10 | Disposition: A | Discharge status: Home / Self Care | Source: Ambulatory Visit | Attending: Orthopedic Surgery | Admitting: Orthopedic Surgery

## 2023-12-10 DIAGNOSIS — M4807 Spinal stenosis, lumbosacral region: Secondary | ICD-10-CM | POA: Diagnosis present

## 2024-03-02 ENCOUNTER — Encounter: Payer: Self-pay | Admitting: Emergency Medicine

## 2024-03-02 ENCOUNTER — Ambulatory Visit
Admission: EM | Admit: 2024-03-02 | Discharge: 2024-03-02 | Disposition: A | Discharge status: Home / Self Care | Attending: Physician Assistant | Admitting: Physician Assistant

## 2024-03-02 ENCOUNTER — Emergency Department
Admission: EM | Admit: 2024-03-02 | Discharge: 2024-03-02 | Disposition: A | Discharge status: Home / Self Care | Source: Ambulatory Visit | Attending: Emergency Medicine | Admitting: Emergency Medicine

## 2024-03-02 ENCOUNTER — Emergency Department

## 2024-03-02 DIAGNOSIS — I10 Essential (primary) hypertension: Secondary | ICD-10-CM | POA: Diagnosis not present

## 2024-03-02 DIAGNOSIS — M79602 Pain in left arm: Secondary | ICD-10-CM

## 2024-03-02 DIAGNOSIS — M25512 Pain in left shoulder: Secondary | ICD-10-CM | POA: Insufficient documentation

## 2024-03-02 DIAGNOSIS — R079 Chest pain, unspecified: Secondary | ICD-10-CM | POA: Diagnosis not present

## 2024-03-02 DIAGNOSIS — E119 Type 2 diabetes mellitus without complications: Secondary | ICD-10-CM | POA: Insufficient documentation

## 2024-03-02 DIAGNOSIS — M4802 Spinal stenosis, cervical region: Secondary | ICD-10-CM

## 2024-03-02 DIAGNOSIS — M79642 Pain in left hand: Secondary | ICD-10-CM | POA: Diagnosis not present

## 2024-03-02 DIAGNOSIS — M25522 Pain in left elbow: Secondary | ICD-10-CM | POA: Diagnosis not present

## 2024-03-02 DIAGNOSIS — M792 Neuralgia and neuritis, unspecified: Secondary | ICD-10-CM

## 2024-03-02 LAB — CBC
HCT: 42.3 % (ref 39.0–52.0)
Hemoglobin: 13.7 g/dL (ref 13.0–17.0)
MCH: 30.3 pg (ref 26.0–34.0)
MCHC: 32.4 g/dL (ref 30.0–36.0)
MCV: 93.6 fL (ref 80.0–100.0)
Platelets: 148 K/uL — ABNORMAL LOW (ref 150–400)
RBC: 4.52 MIL/uL (ref 4.22–5.81)
RDW: 13.5 % (ref 11.5–15.5)
WBC: 4.6 K/uL (ref 4.0–10.5)
nRBC: 0 % (ref 0.0–0.2)

## 2024-03-02 LAB — BASIC METABOLIC PANEL WITH GFR
Anion gap: 11 (ref 5–15)
BUN: 15 mg/dL (ref 8–23)
CO2: 24 mmol/L (ref 22–32)
Calcium: 8.6 mg/dL — ABNORMAL LOW (ref 8.9–10.3)
Chloride: 104 mmol/L (ref 98–111)
Creatinine, Ser: 1.06 mg/dL (ref 0.61–1.24)
GFR, Estimated: >60 mL/min (ref >60)
Glucose, Bld: 251 mg/dL — ABNORMAL HIGH (ref 70–99)
Potassium: 4.3 mmol/L (ref 3.5–5.1)
Sodium: 139 mmol/L (ref 135–145)

## 2024-03-02 LAB — TROPONIN I (HIGH SENSITIVITY): Troponin I (High Sensitivity): 4 ng/L (ref <18)

## 2024-03-02 MED ORDER — DEXAMETHASONE SOD PHOSPHATE PF 10 MG/ML IJ SOLN
10.0000 mg | Freq: Once | INTRAMUSCULAR | Status: AC
  Administered 2024-03-02: 10 mg via INTRAMUSCULAR

## 2024-03-02 MED ORDER — OXYCODONE-ACETAMINOPHEN 5-325 MG PO TABS: 1.0000 | ORAL_TABLET | ORAL | 0 refills | Status: AC | PRN

## 2024-03-02 NOTE — ED Notes (Signed)
 Patient is being discharged from the Urgent Care and sent to the [facility] Emergency Department via private vehicle with spouse . Per Jolan Host, PA, patient is in need of higher level of care due to chest pain and Left arm pain. Patient is aware and verbalizes understanding of plan of care.  Vitals:   03/02/24 1030  BP: (!) 152/83  Pulse: 71  Resp: 16  Temp: 98.2 F (36.8 C)  SpO2: 100%

## 2024-03-02 NOTE — ED Triage Notes (Signed)
 Pt presents to the ED via POV from home for left hand pain, arm pain, and left sided chest pain x1 week. Pt denies SHOB or dizziness.

## 2024-03-02 NOTE — ED Provider Notes (Signed)
 [facility] Provider Note    Event Date/Time   First MD Initiated Contact with Patient 03/02/24 1327     (approximate)  History   Chief Complaint: Chest Pain  HPI  Ian Lucas is a 73 y.o. male with a past medical history of arthritis, diabetes, hypertension, hyperlipidemia, neuropathy, presents to the emergency department for left arm pain.  According to the patient for the past 1 week he has been experiencing pain in his left shoulder down to his fingers of his left arm.  Patient has a history of neuropathy takes gabapentin but states the pain was not improving so he went to the walk-in clinic today.  He states at times the pain would seem to radiate somewhat into the chest so they sent him to the emergency department for evaluation.  Here patient appears well, no distress states pain mostly in the left elbow into the left hand.  No rash or swelling.  No fever.  Physical Exam   Triage Vital Signs: ED Triage Vitals [03/02/24 1311]  Encounter Vitals Group     BP (!) 143/82     Girls Systolic BP Percentile      Girls Diastolic BP Percentile      Boys Systolic BP Percentile      Boys Diastolic BP Percentile      Pulse Rate 81     Resp 18     Temp 98.3 F (36.8 C)     Temp Source Oral     SpO2 97 %     Weight 206 lb 12.7 oz (93.8 kg)     Height 5' 7 (1.702 m)     Head Circumference      Peak Flow      Pain Score 10     Pain Loc      Pain Education      Exclude from Growth Chart     Most recent vital signs: Vitals:   03/02/24 1311  BP: (!) 143/82  Pulse: 81  Resp: 18  Temp: 98.3 F (36.8 C)  SpO2: 97%    General: Awake, no distress.  CV:  Good peripheral perfusion.  Regular rate and rhythm  Resp:  Normal effort.  Equal breath sounds bilaterally.  Abd:  No distention.  Soft, nontender.  No rebound or guarding.  ED Results / Procedures / Treatments   EKG  EKG viewed and interpreted by myself shows a sinus rhythm at 77 bpm with a  narrow QRS, normal axis, slight PR prolongation otherwise normal intervals, no concerning ST changes.  RADIOLOGY  I have reviewed interpret the chest x-ray images.  No consolidation seen on my evaluation. Radiology has read the x-ray as low lung volumes with mild left basilar atelectasis.   MEDICATIONS ORDERED IN ED: Medications - No data to display   IMPRESSION / MDM / ASSESSMENT AND PLAN / ED COURSE  I reviewed the triage vital signs and the nursing notes.  Patient's presentation is most consistent with acute presentation with potential threat to life or bodily function.  Patient presents to the emergency department for left arm pain.  Arm symptoms have been going on since Monday (nearly 1 week).  He describes the pain as starting around the bicep and extending through the hands.  It is worse with active range of motion although no pain with passive range of motion on my exam.  Describes pain in all fingers.  Patient has great pulses to the hand and a warm  hand no concern for ischemia.  Patient's lab work today also reassuring with a normal CBC reassuring chemistry and a negative troponin.  Patient's chest x-ray is clear and EKG reassuring.  Will get an ultrasound performed of the arm as a precaution to rule out DVT.  His symptoms seem more suggestive of possible neuropathic type pain.  Patient is already on gabapentin for known neuropathy.  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  Patient is agreeable to the plan of care.  Ultrasound is negative.  Workup is reassuring.  Will discharge with short course of pain medication and will dose IM Decadron .  Patient will follow-up with his doctor.  FINAL CLINICAL IMPRESSION(S) / ED DIAGNOSES   Left arm pain   Note:  This document was  prepared using Dragon voice recognition software and may include unintentional dictation errors.   Dorothyann Drivers, MD 03/02/24 1512

## 2024-03-02 NOTE — Discharge Instructions (Signed)
 Please scan sinew to take your gabapentin as prescribed by your doctor.  You may take your prescribed pain medication as needed, but only as written.  Do not drink alcohol or drive while taking pain medication.  Please follow-up with your primary care doctor within the next couple days for recheck/reevaluation.  Return to the emergency department for any worsening pain any pain in the chest any shortness of breath or any other symptom concerning to yourself.

## 2024-03-02 NOTE — ED Provider Notes (Signed)
 [facility]    CSN: [id number] Arrival date & time: 03/02/24  1010      History   Chief Complaint Chief Complaint  Patient presents with   Arm Pain    Left lower    HPI Ian Lucas is a 73 y.o. male with history of rheumatoid arthritis, type 2 diabetes, hypertension, hyperlipidemia, neuropathy, chronic pain syndrome related to chronic low back pain status post back surgery, cervical spinal stenosis, bulging cervical intervertebral discs, sleep apnea, mild vascular dementia, BPH, allergies, and CHF.  Patient presents today for 3 week history of intermittent left upper extremity pain. Over the past 1 week the pain has been consistent and excruciating.  He reports pain throughout the entire left arm including shoulder, elbow, upper arm, forearm and into hand.  Tingling sensation of all fingers.  Denies neck pain or stiffness.  Reports mild discomfort into his left upper chest and this area is tender to touch.  His entire arm is tender to touch.  He reports increased pain with movement of wrist, elbow, shoulder.  No change in pain with movement of neck.  He denies palpitations, shortness of breath, wheezing, leg swelling, weakness.  He has seen his provider at the [facility] for similar problems in the past and does have known history of spinal stenosis and bulging of cervical intervertebral discs.  Currently taking Lyrica  50 mg 3 times daily for chronic low back pain.  This dose was recently decreased from 200 mg daily.  He has also been taking meloxicam  and Tylenol  for pain relief but it has not helped.  Denies any sort of injury.  No other complaints or concerns.  HPI  Past Medical History:  Diagnosis Date   AKI (acute kidney injury) 07/26/2022   Arthritis    Rheumatoid   Complication of anesthesia    has become combative following surgery   Diabetes mellitus without complication (HCC)    Type 2   History of kidney stones    x 2   History of shingles     Hypercholesteremia    Hypertension    Neuropathy    Pancreatitis    PTSD (post-traumatic stress disorder)    Sleep apnea    CPAP machine   Vertigo    occasional    Patient Active Problem List   Diagnosis Date Noted   Multilobar lung infiltrate 07/26/2022   Acute hypoxic respiratory failure (HCC) 07/22/2022   Recurrent major depression 07/22/2022   Mild dementia (HCC) 07/22/2022   Type 2 diabetes mellitus (HCC) 07/22/2022   Acute on chronic heart failure with preserved ejection fraction (HFpEF) (HCC) 07/22/2022   Elevated lipase 11/15/2018   Generalized abdominal pain    Chronic pain syndrome 03/08/2016   Ilioinguinal neuralgia of left side 03/08/2016   Elevated prostate specific antigen (PSA) 06/18/2015   Benign essential HTN 06/18/2015   Chronic midline low back pain with bilateral sciatica 06/18/2015   Diabetic neuropathy (HCC) 06/18/2015   Posttraumatic stress disorder 06/18/2015   Dyslipidemia 06/18/2015   Lichen simplex 06/18/2015   History of herpes zoster 06/18/2015   Restless leg 06/18/2015   OSA (obstructive sleep apnea) 06/18/2015   Polyneuropathy 06/18/2015   Idiopathic scoliosis and kyphoscoliosis 12/10/2013   Calculus of kidney 08/21/2013   History of pancreatitis 08/15/2013   Hydrocele 05/19/2013   Arthritis of knee, degenerative 10/30/2012   Lumbar canal stenosis 10/30/2012   Chronic prostatitis 06/19/2012   Hypogonadism in male 06/19/2012   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  Benign prostatic hyperplasia with urinary obstruction 06/19/2012    Past Surgical History:  Procedure Laterality Date   BACK SURGERY     x 2   CATARACT EXTRACTION W/PHACO Left 04/20/2020   Procedure: CATARACT EXTRACTION PHACO AND INTRAOCULAR LENS PLACEMENT (IOC) LEFT DIABETIC 4.37 00:48.7;  Surgeon: Jaye Fallow, MD;  Location: [facility];  Service: Ophthalmology;  Laterality: Left;   CATARACT EXTRACTION W/PHACO Right 05/18/2020   Procedure:  CATARACT EXTRACTION PHACO AND INTRAOCULAR LENS PLACEMENT (IOC) RIGHT DIABETIC;  Surgeon: Jaye Fallow, MD;  Location: [facility];  Service: Ophthalmology;  Laterality: Right;  4.15 0:35.6   CHOLECYSTECTOMY N/A 11/15/2018   Procedure: LAPAROSCOPIC CHOLECYSTECTOMY;  Surgeon: Jordis Laneta FALCON, MD;  Location: [facility];  Service: General;  Laterality: N/A;   CIRCUMCISION N/A    COLONOSCOPY     HERNIA REPAIR     Hiatal   HYDROCELE EXCISION / REPAIR Left        Home Medications    Prior to Admission medications   Medication Sig Start Date End Date Taking? Authorizing Provider  albuterol  (VENTOLIN  HFA) 108 (90 Base) MCG/ACT inhaler Inhale 2 puffs into the lungs every 6 (six) hours as needed for wheezing or shortness of breath. 01/15/22   Brimage, Vondra, DO  Alogliptin  Benzoate 25 MG TABS Take 25 mg by mouth daily.    [provider]  amLODipine  (NORVASC ) 10 MG tablet Take 10 mg by mouth daily.     [provider]  carbamide peroxide (DEBROX) 6.5 % OTIC solution Place in ear(s). 07/20/22   [provider]  carvedilol  (COREG ) 12.5 MG tablet Take 1 tablet (12.5 mg total) by mouth 2 (two) times daily with a meal. Patient not taking: Reported on 10/11/2022 07/27/22   Fausto Sor A, DO  carvedilol  (COREG ) 25 MG tablet Take by mouth. 09/28/22   [provider]  Cholecalciferol 50 MCG (2000 UT) TABS Take by mouth. 08/22/22   [provider]  donepezil (ARICEPT) 10 MG tablet Take 5 mg by mouth in the morning. Patient not taking: Reported on 10/11/2022 07/19/22 07/19/23  [provider]  DULoxetine  (CYMBALTA ) 60 MG capsule Take 60 mg by mouth daily.     [provider]  EPINEPHrine  0.3 mg/0.3 mL IJ SOAJ injection Inject 0.3 mg into the muscle as needed for anaphylaxis. 07/27/22   Fausto Sor A, DO  ergocalciferol (VITAMIN D2) 1.25 MG (50000 UT) capsule Take 50,000 Units by mouth once a week.    [provider]  famotidine  (PEPCID) 20 MG tablet Take 20 mg by mouth daily. 02/20/22   [provider]  fluticasone -salmeterol (ADVAIR HFA) 115-21 MCG/ACT inhaler Inhale 2 puffs into the lungs 2 (two) times daily. 07/21/22   Myrna Camelia HERO, NP  hydrOXYzine  (ATARAX ) 10 MG tablet Take 1 tablet (10 mg total) by mouth 3 (three) times daily as needed for anxiety. 07/27/22   Fausto Sor LABOR, DO  ibuprofen  (ADVIL ,MOTRIN ) 600 MG tablet Take 1 tablet (600 mg total) by mouth every 6 (six) hours as needed. Patient not taking: Reported on 10/11/2022 05/20/16   Van Knee, MD  ipratropium-albuterol  (DUONEB) 0.5-2.5 (3) MG/3ML SOLN Inhale into the lungs. 08/04/22   [provider]  irbesartan  (AVAPRO ) 150 MG tablet Take 1 tablet (150 mg total) by mouth daily. Patient not taking: Reported on 10/11/2022 07/27/22   Fausto Sor A, DO  irbesartan  (AVAPRO ) 300 MG tablet Take by mouth. 08/04/22   [provider]  Melatonin-Pyridoxine (MELATONIN-B6 PO) Take 1 tablet  by mouth at bedtime.    [provider]  meloxicam  (MOBIC ) 15 MG tablet Take by mouth. 08/04/22   [provider]  memantine (NAMENDA) 10 MG tablet Take 10 mg by mouth daily. Patient not taking: Reported on 10/11/2022    [provider]  memantine (NAMENDA) 10 MG tablet Take 1 tablet by mouth every morning. 10/03/22   [provider]  metFORMIN (GLUCOPHAGE) 500 MG tablet Take 500 mg by mouth daily with breakfast.    [provider]  oxyCODONE -acetaminophen  (PERCOCET) 5-325 MG tablet Take 1 tablet by mouth every 4 (four) hours as needed for severe pain (pain score 7-10). 03/02/24   Paduchowski, Kevin, MD  polyethylene glycol (MIRALAX  / GLYCOLAX ) 17 g packet Take 17 g by mouth daily as needed. 07/27/22   Fausto Sor A, DO  potassium chloride  (KLOR-CON ) 20 MEQ packet Take 40 mEq by mouth daily for 7 days. 07/28/22 08/04/22  Fausto Sor LABOR, DO  pregabalin  (LYRICA ) 150 MG capsule Take 1 capsule by mouth at bedtime.  09/28/22   [provider]  pregabalin  (LYRICA ) 300 MG capsule Take 200 mg by mouth 2 (two) times daily. Patient not taking: Reported on 10/11/2022    [provider]  pyridOXINE (B-6) 50 MG tablet Take by mouth. 09/28/22   [provider]  rosuvastatin (CRESTOR) 10 MG tablet Take 10 mg by mouth daily. 02/20/22   [provider]  Semaglutide,0.25 or 0.5MG /DOS, 2 MG/3ML SOPN Inject into the skin. Patient not taking: Reported on 10/11/2022 10/09/22   [provider]  senna-docusate (SENOKOT-S) 8.6-50 MG tablet Take by mouth. 09/28/22   [provider]  Spacer/Aero-Holding Chambers (AEROCHAMBER MV) inhaler Use as instructed 08/19/20   Bernardino Ditch, NP  spironolactone (ALDACTONE) 25 MG tablet Take by mouth. 08/22/22   [provider]  tamsulosin  (FLOMAX ) 0.4 MG CAPS capsule Take 0.4 mg by mouth 2 (two) times a day.  10/21/12   [provider]  tiotropium (SPIRIVA ) 18 MCG inhalation capsule Place 18 mcg into inhaler and inhale daily.    [provider]  traZODone  (DESYREL ) 50 MG tablet Take 1 tablet (50 mg total) by mouth at bedtime. 07/27/22   Fausto Sor A, DO  insulin  glargine (LANTUS ) 100 UNIT/ML injection Inject 40 Units into the skin daily.  Patient not taking: No sig reported 11/06/17 08/19/20  [provider]    Family History Family History  Problem Relation Age of Onset   Stroke Mother    Diabetes Father     Social History Social History   Tobacco Use   Smoking status: Former    Current packs/day: 0.00    Average packs/day: 0.3 packs/day for 30.1 years (7.5 ttl pk-yrs)    Types: Cigarettes    Start date: 05/08/1968    Quit date: 06/17/1998    Years since quitting: 25.7   Smokeless tobacco: Never  Vaping Use   Vaping status: Never Used  Substance Use Topics   Alcohol use: No    Alcohol/week: 0.0 standard drinks of alcohol   Drug use: No     Allergies   Banana, Codeine, Iodine, Peanut oil, Ace  inhibitors, Atorvastatin, Banana extract allergy skin test, Gabapentin, Iodinated contrast media, Lisinopril, Metformin, and Peanut-containing drug products   Review of Systems Review of Systems  Constitutional:  Negative for fatigue.  Respiratory:  Negative for shortness of breath and wheezing.   Cardiovascular:  Positive for chest pain. Negative for palpitations and leg swelling.  Gastrointestinal:  Negative for  nausea and vomiting.  Musculoskeletal:  Positive for arthralgias and back pain (chronic). Negative for gait problem, joint swelling, neck pain and neck stiffness.  Skin:  Negative for color change, rash and wound.  Neurological:  Negative for dizziness, syncope, weakness, numbness and headaches.       Tingling of fingertips left hand     Physical Exam Triage Vital Signs ED Triage Vitals  Encounter Vitals Group     BP      Girls Systolic BP Percentile      Girls Diastolic BP Percentile      Boys Systolic BP Percentile      Boys Diastolic BP Percentile      Pulse      Resp      Temp      Temp src      SpO2      Weight      Height      Head Circumference      Peak Flow      Pain Score      Pain Loc      Pain Education      Exclude from Growth Chart    No data found.  Updated Vital Signs BP (!) 152/83 (BP Location: Left Arm)   Pulse 71   Temp 98.2 F (36.8 C) (Oral)   Resp 16   Ht 5' 7 (1.702 m)   Wt 206 lb 12.7 oz (93.8 kg)   SpO2 100%   BMI 32.39 kg/m     Physical Exam Vitals and nursing note reviewed.  Constitutional:      General: He is not in acute distress.    Appearance: Normal appearance. He is well-developed. He is not ill-appearing.  HENT:     Head: Normocephalic and atraumatic.  Eyes:     General: No scleral icterus.    Conjunctiva/sclera: Conjunctivae normal.  Cardiovascular:     Rate and Rhythm: Normal rate and regular rhythm.  Pulmonary:     Effort: Pulmonary effort is normal. No respiratory distress.     Breath sounds: Normal  breath sounds.  Musculoskeletal:     Cervical back: Neck supple.     Right lower leg: No edema.     Left lower leg: No edema.     Comments: Patient has generalized tenderness throughout the entire left upper extremity and reduced range of motion of the left shoulder to 90 degrees abduction and 90 degrees flexion.  Full range of motion of elbow but he reports pain with movement.  No swelling, contusions, wounds, rashes.  Currently wearing a brace on his hand/wrist.  No tenderness of neck.  Mildly reduced range of motion of neck with rotation to the left.  Has tenderness throughout left chest wall.  Full strength of upper extremities.  Skin:    General: Skin is warm and dry.     Capillary Refill: Capillary refill takes less than 2 seconds.  Neurological:     General: No focal deficit present.     Mental Status: He is alert. Mental status is at baseline.     Motor: No weakness.     Gait: Gait normal.  Psychiatric:        Mood and Affect: Mood normal.        Behavior: Behavior normal.      UC Treatments / Results  Labs (all labs ordered are listed, but only abnormal results are displayed) Labs Reviewed - No data to display  EKG   Radiology US   Venous Img Upper Uni Left Result Date: 03/02/2024 CLINICAL DATA:  Pain. EXAM: LEFT UPPER EXTREMITY VENOUS DOPPLER ULTRASOUND TECHNIQUE: Gray-scale sonography with graded compression, as well as color Doppler and duplex ultrasound were performed to evaluate the upper extremity deep venous system from the level of the subclavian vein and including the jugular, axillary, basilic, radial, ulnar and upper cephalic vein. Spectral Doppler was utilized to evaluate flow at rest and with distal augmentation maneuvers. COMPARISON:  None Available. FINDINGS: Contralateral Subclavian Vein:  Not assessed. Internal Jugular Vein: No evidence of thrombus. Normal compressibility, respiratory phasicity and response to augmentation. Subclavian Vein: No evidence of  thrombus. Normal compressibility, respiratory phasicity and response to augmentation. Axillary Vein: No evidence of thrombus. Normal compressibility, respiratory phasicity and response to augmentation. Cephalic Vein: No evidence of thrombus. Normal compressibility, respiratory phasicity and response to augmentation. Basilic Vein: No evidence of thrombus. Normal compressibility, respiratory phasicity and response to augmentation. Brachial Veins: No evidence of thrombus. Normal compressibility, respiratory phasicity and response to augmentation. Radial Veins: No evidence of thrombus. Normal compressibility, respiratory phasicity and response to augmentation. Ulnar Veins: No evidence of thrombus. Normal compressibility, respiratory phasicity and response to augmentation. Other Findings:  None visualized. IMPRESSION: No evidence of DVT within the left upper extremity. Electronically Signed   By: Andrea Gasman M.D.   On: 03/02/2024 15:04   DG Chest 2 View Result Date: 03/02/2024 CLINICAL DATA:  Chest pain. EXAM: DG CHEST 2V COMPARISON:  07/22/2022 FINDINGS: Lung volumes are low.The cardiomediastinal contours are normal. Minor atelectasis at the left lung base. Pulmonary vasculature is normal. No consolidation, pleural effusion, or pneumothorax. No acute osseous abnormalities are seen. IMPRESSION: Low lung volumes with minor left basilar atelectasis. Electronically Signed   By: Andrea Gasman M.D.   On: 03/02/2024 13:50   Study Result  Narrative & Impression  CLINICAL DATA:  Neck pain radiating to right arm   EXAM: MRI CERVICAL SPINE WITHOUT CONTRAST   TECHNIQUE: Multiplanar, multisequence MR imaging of the cervical spine was performed. No intravenous contrast was administered.   COMPARISON:  09/21/2004   FINDINGS: Alignment: Reversal of normal cervical lordosis. Grade 1 anterolisthesis at C4-5.   Vertebrae: No fracture, evidence of discitis, or bone lesion.   Cord: Normal signal and  morphology.   Posterior Fossa, vertebral arteries, paraspinal tissues: Negative   Disc levels:   C1-2: Unremarkable.   C2-3: Normal disc space and facet joints. There is no spinal canal stenosis. No neural foraminal stenosis.   C3-4: Normal disc space and facet joints. There is no spinal canal stenosis. No neural foraminal stenosis.   C4-5: Disc bulge with superimposed intermediate sized left subarticular protrusion with uncovertebral spurring. Moderate spinal canal stenosis. Moderate left neural foraminal stenosis.   C5-6: Minimal disc bulge with left-greater-than-right uncovertebral hypertrophy. There is no spinal canal stenosis. Mild right and moderate left neural foraminal stenosis.   C6-7: Intermediate sized disc bulge with bilateral endplate spurring. There is no spinal canal stenosis. Moderate bilateral neural foraminal stenosis.   C7-T1: Left asymmetric disc bulge with uncovertebral hypertrophy. There is no spinal canal stenosis. Mild left neural foraminal stenosis.   IMPRESSION: 1. Progression of moderate spinal canal stenosis at C4-5 with moderate left neural foraminal stenosis. 2. Moderate bilateral C6-7 neural foraminal stenosis. 3. Moderate left C5-6 neural foraminal stenosis.     Electronically Signed   By: Franky Stanford M.D.   On: 01/30/2023 14:10  Study Result  Narrative & Impression  CLINICAL DATA:  Initial evaluation for dementia.  EXAM: MRI HEAD WITHOUT CONTRAST   TECHNIQUE: Multiplanar, multiecho pulse sequences of the brain and surrounding structures were obtained without intravenous contrast.   COMPARISON:  Prior study from 06/17/2010.   FINDINGS: Brain: Examination degraded by motion artifact.   Cerebral volume within normal limits for age. Patchy T2/FLAIR hyperintensity involving the periventricular deep white matter both cerebral hemispheres, consistent with chronic small vessel ischemic disease.   No evidence for acute or  subacute ischemia. Gray-white matter differentiation maintained. No areas of chronic cortical infarction. No visible acute or chronic intracranial blood products.   No mass lesion, midline shift or mass effect. No hydrocephalus or extra-axial fluid collection.   Vascular: Major intracranial vascular flow voids are grossly maintained.   Skull and upper cervical spine: Craniocervical junction within normal limits. Bone marrow signal intensity grossly normal. No scalp soft tissue abnormality.   Sinuses/Orbits: Prior bilateral ocular lens replacement. Paranasal sinuses are largely clear. No mastoid effusion.   Other: None.   IMPRESSION: 1. Motion degraded exam. 2. No acute intracranial abnormality. 3. Mild chronic microvascular ischemic disease for age.     Electronically Signed   By: Morene Hoard M.D.   On: 07/25/2022 20:37   Procedures ED EKG  Date/Time: 03/02/2024 11:16 AM  Performed by: Arvis Jolan NOVAK, PA-C Authorized by: Arvis Jolan NOVAK, PA-C   Previous ECG:    Previous ECG:  Compared to current Interpretation:    Interpretation: abnormal   Rate:    ECG rate:  67   ECG rate assessment: normal   Rhythm:    Rhythm: sinus rhythm and A-V block     A-V block: 1st Degree   Ectopy:    Ectopy: none   QRS:    QRS axis:  Normal   QRS intervals:  Normal   QRS conduction: normal   Comments:     Sinus rhythm with first degree AV block. No acute ST-T wave changes. Compared to EKG from July 22, 2022.  (including critical care time)  Medications Ordered in UC Medications - No data to display  Initial Impression / Assessment and Plan / UC Course  I have reviewed the triage vital signs and the nursing notes.  Pertinent labs & imaging results that were available during my care of the patient were reviewed by me and considered in my medical decision making (see chart for details).   73 year old male with history of rheumatoid arthritis, type 2 diabetes,  hypertension, hyperlipidemia, neuropathy, chronic pain syndrome related to chronic low back pain status post back surgery, cervical spinal stenosis, bulging cervical intervertebral discs, sleep apnea, mild vascular dementia, BPH, allergies, and CHF presents today for 3 week history of left upper extremity pain.  Patient says it feels like the pain radiates from his fingertips all the way up his left arm to his shoulder.  He does report mild discomfort in his left upper chest which is nothing compared to the pain in his left arm.  He describes it as excruciating.  Has been taking Lyrica , Tylenol  meloxicam  without relief.  BP is 152/83.  Other vitals normal and stable.  No acute distress.  See physical exam notes.  He has tenderness throughout the entire left upper extremity, reduced range of motion of the shoulder.  Mildly reduced range of motion of neck with rotation to the left but no tenderness of neck.  Mild tenderness of left upper chest wall.  Chest is clear.  Heart regular rate and rhythm.  Will perform EKG. Sinus rhythm with first  degree AV block. No acute ST-T wave changes. Compared to EKG from July 22, 2022.  Heart Score: 4  Scores 4-6: 12-16.6% risk of adverse cardiac event. In the HEART Score study, these patients were admitted to the hospital. (11.6% retrospective, 16.6% prospective).  Discussed with patient and his wife that since he is having continued, active chest pain he needs a cardiac work up. Unable to confidently attribute all symptoms to cervical radiculopathy at this time--without cardiac work up. Discussed going to ED right away. Discussed risks of not doing so including life threatening circumstances. Declines EMS. Patient and wife say they will go to [facility] right away. Leaving in stable condition.   Final Clinical Impressions(s) / UC Diagnoses   Final diagnoses:  Chest pain, unspecified type  Left arm pain  Spinal stenosis in cervical region     Discharge Instructions       -Need to go to ER since you have had chest pain. This needs to be looked at further. You could have a cardiac issue causing all of your symptoms. You needs labs and monitoring before saying symptoms are due to pinched nerve in neck or arthritis. If you do not go to ER to have this checked out and do have a cardiac event, this could be life threatening.  You have been advised to follow up immediately in the emergency department for concerning signs.symptoms. If you declined EMS transport, please have a family member take you directly to the ED at this time. Do not delay. Based on concerns about condition, if you do not follow up in th e ED, you may risk poor outcomes including worsening of condition, delayed treatment and potentially life threatening issues. If you have declined to go to the ED at this time, you should call your PCP immediately to set up a follow up appointment.  Go to ED for red flag symptoms, including; fevers you cannot reduce with Tylenol /Motrin , severe headaches, vision changes, numbness/weakness in part of the body, lethargy, confusion, intractable vomiting, severe dehydration, chest pain, breathing difficulty, severe persistent abdominal or pelvic pain, signs of severe infection (increased redness, swelling of an area), feeling faint or passing out, dizziness, etc. You should especially go to the ED for sudden acute worsening of condition if you do not elect to go at this time.      ED Prescriptions   None    I have reviewed the PDMP during this encounter.   Arvis Jolan NOVAK, PA-C 03/02/24 [phone]

## 2024-03-02 NOTE — Discharge Instructions (Addendum)
-  Need to go to ER since you have had chest pain. This needs to be looked at further. You could have a cardiac issue causing all of your symptoms. You needs labs and monitoring before saying symptoms are due to pinched nerve in neck or arthritis. If you do not go to ER to have this checked out and do have a cardiac event, this could be life threatening.  You have been advised to follow up immediately in the emergency department for concerning signs.symptoms. If you declined EMS transport, please have a family member take you directly to the ED at this time. Do not delay. Based on concerns about condition, if you do not follow up in th e ED, you may risk poor outcomes including worsening of condition, delayed treatment and potentially life threatening issues. If you have declined to go to the ED at this time, you should call your PCP immediately to set up a follow up appointment.  Go to ED for red flag symptoms, including; fevers you cannot reduce with Tylenol /Motrin , severe headaches, vision changes, numbness/weakness in part of the body, lethargy, confusion, intractable vomiting, severe dehydration, chest pain, breathing difficulty, severe persistent abdominal or pelvic pain, signs of severe infection (increased redness, swelling of an area), feeling faint or passing out, dizziness, etc. You should especially go to the ED for sudden acute worsening of condition if you do not elect to go at this time.

## 2024-03-02 NOTE — ED Notes (Signed)
 Constant L shoulder pain and L arm/elbow pain to fingers since 1 week. Worse with certain movements. Denies neck pain. Grip strength equal. Denies SOB, dizziness. States pain has radiated into his chest. Hx CHF. EDP at bedside.

## 2024-03-02 NOTE — ED Triage Notes (Signed)
 Patient states that he has arthritis and has had pain in his left hand that radiates up his left lower arm for a week.  Patient denies injury or fall.  Patient has been taking Tylenol  for the pain.

## 2024-03-02 NOTE — ED Notes (Signed)
 Pt in xray
